# Patient Record
Sex: Female | Born: 1945
Health system: Southern US, Community
[De-identification: ages and names within clinical notes are randomized; demographics above are authoritative.]

## PROBLEM LIST (undated history)

## (undated) DIAGNOSIS — Z9889 Other specified postprocedural states: Secondary | ICD-10-CM

## (undated) DIAGNOSIS — T7840XA Allergy, unspecified, initial encounter: Secondary | ICD-10-CM

## (undated) DIAGNOSIS — K635 Polyp of colon: Secondary | ICD-10-CM

## (undated) DIAGNOSIS — Z85828 Personal history of other malignant neoplasm of skin: Secondary | ICD-10-CM

## (undated) DIAGNOSIS — C4491 Basal cell carcinoma of skin, unspecified: Secondary | ICD-10-CM

## (undated) DIAGNOSIS — K602 Anal fissure, unspecified: Secondary | ICD-10-CM

## (undated) DIAGNOSIS — M1990 Unspecified osteoarthritis, unspecified site: Secondary | ICD-10-CM

## (undated) DIAGNOSIS — K589 Irritable bowel syndrome without diarrhea: Secondary | ICD-10-CM

## (undated) DIAGNOSIS — I10 Essential (primary) hypertension: Secondary | ICD-10-CM

## (undated) DIAGNOSIS — D039 Melanoma in situ, unspecified: Secondary | ICD-10-CM

## (undated) DIAGNOSIS — E785 Hyperlipidemia, unspecified: Secondary | ICD-10-CM

## (undated) DIAGNOSIS — R112 Nausea with vomiting, unspecified: Secondary | ICD-10-CM

## (undated) DIAGNOSIS — C439 Malignant melanoma of skin, unspecified: Secondary | ICD-10-CM

## (undated) DIAGNOSIS — H8110 Benign paroxysmal vertigo, unspecified ear: Secondary | ICD-10-CM

## (undated) DIAGNOSIS — T8859XA Other complications of anesthesia, initial encounter: Secondary | ICD-10-CM

## (undated) DIAGNOSIS — Z8616 Personal history of COVID-19: Secondary | ICD-10-CM

## (undated) DIAGNOSIS — T4145XA Adverse effect of unspecified anesthetic, initial encounter: Secondary | ICD-10-CM

## (undated) HISTORY — DX: Benign paroxysmal vertigo, unspecified ear: H81.10

## (undated) HISTORY — PX: MOHS SURGERY: SUR867

## (undated) HISTORY — DX: Allergy, unspecified, initial encounter: T78.40XA

## (undated) HISTORY — DX: Malignant melanoma of skin, unspecified: C43.9

## (undated) HISTORY — PX: OTHER SURGICAL HISTORY: SHX169

## (undated) HISTORY — DX: Essential (primary) hypertension: I10

## (undated) HISTORY — DX: Irritable bowel syndrome, unspecified: K58.9

## (undated) HISTORY — DX: Hyperlipidemia, unspecified: E78.5

## (undated) HISTORY — DX: Personal history of other malignant neoplasm of skin: Z85.828

## (undated) HISTORY — DX: Polyp of colon: K63.5

## (undated) HISTORY — DX: Anal fissure, unspecified: K60.2

## (undated) HISTORY — DX: Personal history of COVID-19: Z86.16

## (undated) HISTORY — DX: Basal cell carcinoma of skin, unspecified: C44.91

## (undated) HISTORY — PX: ABDOMINAL HYSTERECTOMY: SHX81

## (undated) HISTORY — DX: Melanoma in situ, unspecified: D03.9

---

## 1998-02-05 ENCOUNTER — Other Ambulatory Visit: Admission: RE | Admit: 1998-02-05 | Discharge: 1998-02-05 | Payer: Self-pay | Admitting: *Deleted

## 1998-05-31 ENCOUNTER — Emergency Department (HOSPITAL_COMMUNITY): Admission: EM | Admit: 1998-05-31 | Discharge: 1998-05-31 | Payer: Self-pay | Admitting: Emergency Medicine

## 1998-09-20 ENCOUNTER — Encounter (INDEPENDENT_AMBULATORY_CARE_PROVIDER_SITE_OTHER): Payer: Self-pay | Admitting: Specialist

## 1998-09-20 ENCOUNTER — Ambulatory Visit (HOSPITAL_COMMUNITY): Admission: RE | Admit: 1998-09-20 | Discharge: 1998-09-20 | Payer: Self-pay | Admitting: *Deleted

## 1999-02-20 ENCOUNTER — Other Ambulatory Visit: Admission: RE | Admit: 1999-02-20 | Discharge: 1999-02-20 | Payer: Self-pay | Admitting: *Deleted

## 2000-02-24 ENCOUNTER — Other Ambulatory Visit: Admission: RE | Admit: 2000-02-24 | Discharge: 2000-02-24 | Payer: Self-pay | Admitting: *Deleted

## 2001-02-22 ENCOUNTER — Other Ambulatory Visit: Admission: RE | Admit: 2001-02-22 | Discharge: 2001-02-22 | Payer: Self-pay | Admitting: *Deleted

## 2002-03-16 ENCOUNTER — Other Ambulatory Visit: Admission: RE | Admit: 2002-03-16 | Discharge: 2002-03-16 | Payer: Self-pay | Admitting: *Deleted

## 2003-01-27 LAB — HM COLONOSCOPY: HM Colonoscopy: NORMAL

## 2003-03-21 ENCOUNTER — Other Ambulatory Visit: Admission: RE | Admit: 2003-03-21 | Discharge: 2003-03-21 | Payer: Self-pay | Admitting: *Deleted

## 2003-04-04 ENCOUNTER — Encounter: Payer: Self-pay | Admitting: Internal Medicine

## 2003-05-04 ENCOUNTER — Encounter: Payer: Self-pay | Admitting: Internal Medicine

## 2004-03-26 ENCOUNTER — Other Ambulatory Visit: Admission: RE | Admit: 2004-03-26 | Discharge: 2004-03-26 | Payer: Self-pay | Admitting: *Deleted

## 2005-03-23 ENCOUNTER — Encounter: Admission: RE | Admit: 2005-03-23 | Discharge: 2005-06-21 | Payer: Self-pay | Admitting: Endocrinology

## 2005-06-03 ENCOUNTER — Other Ambulatory Visit: Admission: RE | Admit: 2005-06-03 | Discharge: 2005-06-03 | Payer: Self-pay | Admitting: *Deleted

## 2005-06-06 ENCOUNTER — Emergency Department (HOSPITAL_COMMUNITY): Admission: EM | Admit: 2005-06-06 | Discharge: 2005-06-06 | Payer: Self-pay | Admitting: Family Medicine

## 2005-08-25 ENCOUNTER — Encounter: Payer: Self-pay | Admitting: Internal Medicine

## 2005-11-23 ENCOUNTER — Ambulatory Visit: Payer: Self-pay | Admitting: Internal Medicine

## 2005-12-15 ENCOUNTER — Ambulatory Visit: Payer: Self-pay | Admitting: Internal Medicine

## 2005-12-15 LAB — CONVERTED CEMR LAB
Basophils Relative: 0.3 % (ref 0.0–1.0)
Calcium: 9.4 mg/dL (ref 8.4–10.5)
Cholesterol: 192 mg/dL (ref 0–200)
Glomerular Filtration Rate, Af Am: 93 mL/min/{1.73_m2}
HDL: 71.2 mg/dL (ref >39.0)
Hemoglobin: 12.8 g/dL (ref 12.0–15.0)
LDL Cholesterol: 92 mg/dL (ref 0–99)
Lymphocytes Relative: 34.2 % (ref 12.0–46.0)
MCHC: 34 g/dL (ref 30.0–36.0)
Monocytes Relative: 7.9 % (ref 3.0–11.0)
Platelets: 258 10*3/uL (ref 150–400)
Potassium: 4.7 meq/L (ref 3.5–5.1)
RBC: 4.08 M/uL (ref 3.87–5.11)
RDW: 12.5 % (ref 11.5–14.6)
TSH: 0.85 microintl units/mL (ref 0.35–5.50)
Total Bilirubin: 0.6 mg/dL (ref 0.3–1.2)
VLDL: 29 mg/dL (ref 0–40)
WBC: 7 10*3/uL (ref 4.5–10.5)

## 2005-12-22 ENCOUNTER — Ambulatory Visit: Payer: Self-pay | Admitting: Internal Medicine

## 2006-03-10 ENCOUNTER — Ambulatory Visit: Payer: Self-pay | Admitting: Internal Medicine

## 2006-05-27 LAB — CONVERTED CEMR LAB: Pap Smear: NORMAL

## 2006-06-08 ENCOUNTER — Other Ambulatory Visit: Admission: RE | Admit: 2006-06-08 | Discharge: 2006-06-08 | Payer: Self-pay | Admitting: *Deleted

## 2006-07-05 ENCOUNTER — Encounter: Payer: Self-pay | Admitting: Internal Medicine

## 2006-10-29 ENCOUNTER — Ambulatory Visit: Payer: Self-pay | Admitting: Internal Medicine

## 2006-10-29 DIAGNOSIS — I10 Essential (primary) hypertension: Secondary | ICD-10-CM | POA: Insufficient documentation

## 2006-11-08 ENCOUNTER — Telehealth: Payer: Self-pay | Admitting: *Deleted

## 2006-12-22 ENCOUNTER — Ambulatory Visit: Payer: Self-pay | Admitting: Internal Medicine

## 2006-12-22 DIAGNOSIS — D649 Anemia, unspecified: Secondary | ICD-10-CM | POA: Insufficient documentation

## 2006-12-22 DIAGNOSIS — Z860100 Personal history of colon polyps, unspecified: Secondary | ICD-10-CM | POA: Insufficient documentation

## 2006-12-22 DIAGNOSIS — E781 Pure hyperglyceridemia: Secondary | ICD-10-CM | POA: Insufficient documentation

## 2006-12-27 ENCOUNTER — Encounter: Payer: Self-pay | Admitting: Internal Medicine

## 2006-12-28 ENCOUNTER — Telehealth: Payer: Self-pay | Admitting: Internal Medicine

## 2007-01-04 ENCOUNTER — Telehealth: Payer: Self-pay | Admitting: Internal Medicine

## 2007-01-05 ENCOUNTER — Ambulatory Visit: Payer: Self-pay | Admitting: Internal Medicine

## 2007-01-05 DIAGNOSIS — H8110 Benign paroxysmal vertigo, unspecified ear: Secondary | ICD-10-CM | POA: Insufficient documentation

## 2007-01-05 LAB — CONVERTED CEMR LAB
Basophils Relative: 0 % (ref 0–1)
Cholesterol, target level: 200 mg/dL
Eosinophils Absolute: 0 10*3/uL — ABNORMAL LOW (ref 0.2–0.7)
Eosinophils Relative: 1 % (ref 0–5)
Ferritin: 210 ng/mL (ref 10–291)
HCT: 40 % (ref 36.0–46.0)
Hemoglobin: 13.1 g/dL (ref 12.0–15.0)
LDL Goal: 130 mg/dL
Lymphocytes Relative: 46 % (ref 12–46)
MCHC: 32.8 g/dL (ref 30.0–36.0)
MCV: 93.2 fL (ref 78.0–100.0)
Neutrophils Relative %: 43 % (ref 43–77)
Platelets: 180 10*3/uL (ref 150–400)
RBC: 4.29 M/uL (ref 3.87–5.11)
Vitamin B-12: 418 pg/mL (ref 211–911)

## 2007-01-12 ENCOUNTER — Ambulatory Visit: Payer: Self-pay | Admitting: Internal Medicine

## 2007-01-21 ENCOUNTER — Encounter: Payer: Self-pay | Admitting: Internal Medicine

## 2007-05-04 ENCOUNTER — Ambulatory Visit: Payer: Self-pay | Admitting: Internal Medicine

## 2007-05-08 LAB — CONVERTED CEMR LAB
LDL Cholesterol: 87 mg/dL (ref 0–99)
Total CHOL/HDL Ratio: 2.7
Triglycerides: 163 mg/dL — ABNORMAL HIGH (ref 0–149)
VLDL: 33 mg/dL (ref 0–40)

## 2007-05-19 ENCOUNTER — Ambulatory Visit: Payer: Self-pay | Admitting: Internal Medicine

## 2007-05-19 DIAGNOSIS — J301 Allergic rhinitis due to pollen: Secondary | ICD-10-CM | POA: Insufficient documentation

## 2007-06-15 ENCOUNTER — Other Ambulatory Visit: Admission: RE | Admit: 2007-06-15 | Discharge: 2007-06-15 | Payer: Self-pay | Admitting: Gynecology

## 2007-06-15 ENCOUNTER — Encounter: Payer: Self-pay | Admitting: Internal Medicine

## 2007-07-06 ENCOUNTER — Encounter: Payer: Self-pay | Admitting: Internal Medicine

## 2007-09-19 ENCOUNTER — Ambulatory Visit: Payer: Self-pay | Admitting: Internal Medicine

## 2007-09-19 DIAGNOSIS — K625 Hemorrhage of anus and rectum: Secondary | ICD-10-CM | POA: Insufficient documentation

## 2007-09-21 ENCOUNTER — Encounter: Payer: Self-pay | Admitting: Internal Medicine

## 2007-09-26 ENCOUNTER — Telehealth: Payer: Self-pay | Admitting: *Deleted

## 2007-12-20 ENCOUNTER — Ambulatory Visit: Payer: Self-pay | Admitting: Internal Medicine

## 2007-12-20 LAB — CONVERTED CEMR LAB
ALT: 26 units/L (ref 0–35)
AST: 23 units/L (ref 0–37)
Albumin: 3.8 g/dL (ref 3.5–5.2)
Alkaline Phosphatase: 63 units/L (ref 39–117)
BUN: 13 mg/dL (ref 6–23)
CO2: 30 meq/L (ref 19–32)
Chloride: 106 meq/L (ref 96–112)
Eosinophils Relative: 2.7 % (ref 0.0–5.0)
GFR calc Af Amer: 93 mL/min
Glucose, Bld: 92 mg/dL (ref 70–99)
HCT: 37.5 % (ref 36.0–46.0)
Ketones, urine, test strip: NEGATIVE
Monocytes Absolute: 0.5 10*3/uL (ref 0.1–1.0)
Monocytes Relative: 7.3 % (ref 3.0–12.0)
Nitrite: NEGATIVE
Platelets: 217 10*3/uL (ref 150–400)
Potassium: 4.1 meq/L (ref 3.5–5.1)
Total CHOL/HDL Ratio: 3.1
Total Protein: 7.3 g/dL (ref 6.0–8.3)
Triglycerides: 136 mg/dL (ref 0–149)
Urobilinogen, UA: 0.2
WBC: 6.4 10*3/uL (ref 4.5–10.5)

## 2007-12-28 ENCOUNTER — Ambulatory Visit: Payer: Self-pay | Admitting: Internal Medicine

## 2007-12-28 DIAGNOSIS — N951 Menopausal and female climacteric states: Secondary | ICD-10-CM | POA: Insufficient documentation

## 2008-01-09 ENCOUNTER — Telehealth: Payer: Self-pay | Admitting: *Deleted

## 2008-01-13 ENCOUNTER — Telehealth: Payer: Self-pay | Admitting: Internal Medicine

## 2008-02-10 ENCOUNTER — Telehealth: Payer: Self-pay | Admitting: *Deleted

## 2008-02-24 ENCOUNTER — Telehealth: Payer: Self-pay | Admitting: Internal Medicine

## 2008-03-21 ENCOUNTER — Ambulatory Visit: Payer: Self-pay | Admitting: Internal Medicine

## 2008-03-21 DIAGNOSIS — M542 Cervicalgia: Secondary | ICD-10-CM | POA: Insufficient documentation

## 2008-03-26 ENCOUNTER — Telehealth: Payer: Self-pay | Admitting: *Deleted

## 2008-04-02 ENCOUNTER — Encounter: Payer: Self-pay | Admitting: Internal Medicine

## 2008-04-25 ENCOUNTER — Telehealth: Payer: Self-pay | Admitting: *Deleted

## 2008-07-09 ENCOUNTER — Encounter: Payer: Self-pay | Admitting: *Deleted

## 2008-07-09 ENCOUNTER — Encounter: Payer: Self-pay | Admitting: Internal Medicine

## 2008-09-19 ENCOUNTER — Ambulatory Visit: Payer: Self-pay | Admitting: Internal Medicine

## 2008-09-19 DIAGNOSIS — M21619 Bunion of unspecified foot: Secondary | ICD-10-CM | POA: Insufficient documentation

## 2008-09-25 ENCOUNTER — Encounter: Payer: Self-pay | Admitting: Internal Medicine

## 2008-10-22 ENCOUNTER — Ambulatory Visit: Payer: Self-pay | Admitting: Family Medicine

## 2008-10-22 DIAGNOSIS — R209 Unspecified disturbances of skin sensation: Secondary | ICD-10-CM | POA: Insufficient documentation

## 2008-11-15 ENCOUNTER — Telehealth: Payer: Self-pay | Admitting: *Deleted

## 2008-11-23 HISTORY — PX: BUNIONECTOMY: SHX129

## 2008-12-03 ENCOUNTER — Telehealth: Payer: Self-pay | Admitting: *Deleted

## 2008-12-07 ENCOUNTER — Telehealth: Payer: Self-pay | Admitting: *Deleted

## 2008-12-12 ENCOUNTER — Telehealth: Payer: Self-pay | Admitting: *Deleted

## 2009-01-01 ENCOUNTER — Ambulatory Visit: Payer: Self-pay | Admitting: Internal Medicine

## 2009-01-01 LAB — CONVERTED CEMR LAB
ALT: 19 units/L (ref 0–35)
AST: 16 units/L (ref 0–37)
Basophils Relative: 0.3 % (ref 0.0–3.0)
Bilirubin, Direct: 0 mg/dL (ref 0.0–0.3)
Chloride: 105 meq/L (ref 96–112)
Creatinine, Ser: 0.8 mg/dL (ref 0.4–1.2)
Direct LDL: 123.6 mg/dL
Eosinophils Relative: 2.1 % (ref 0.0–5.0)
GFR calc non Af Amer: 76.37 mL/min (ref >60)
HCT: 38 % (ref 36.0–46.0)
HDL: 60.5 mg/dL (ref >39.00)
MCV: 94.4 fL (ref 78.0–100.0)
Monocytes Absolute: 0.5 10*3/uL (ref 0.1–1.0)
Monocytes Relative: 8.3 % (ref 3.0–12.0)
Neutrophils Relative %: 47.4 % (ref 43.0–77.0)
Nitrite: NEGATIVE
Potassium: 4.6 meq/L (ref 3.5–5.1)
Protein, U semiquant: NEGATIVE
RBC: 4.03 M/uL (ref 3.87–5.11)
Total Bilirubin: 0.7 mg/dL (ref 0.3–1.2)
Total CHOL/HDL Ratio: 3
Total Protein: 7.3 g/dL (ref 6.0–8.3)
Urobilinogen, UA: 0.2
VLDL: 20.8 mg/dL (ref 0.0–40.0)
WBC Urine, dipstick: NEGATIVE
WBC: 6.2 10*3/uL (ref 4.5–10.5)

## 2009-01-09 ENCOUNTER — Ambulatory Visit: Payer: Self-pay | Admitting: Internal Medicine

## 2009-02-26 ENCOUNTER — Telehealth: Payer: Self-pay | Admitting: *Deleted

## 2009-04-24 ENCOUNTER — Telehealth: Payer: Self-pay | Admitting: *Deleted

## 2009-04-26 ENCOUNTER — Encounter: Payer: Self-pay | Admitting: Internal Medicine

## 2009-05-08 ENCOUNTER — Telehealth: Payer: Self-pay | Admitting: *Deleted

## 2009-07-11 ENCOUNTER — Encounter: Payer: Self-pay | Admitting: Internal Medicine

## 2009-07-24 ENCOUNTER — Telehealth: Payer: Self-pay | Admitting: *Deleted

## 2009-08-12 ENCOUNTER — Ambulatory Visit: Payer: Self-pay | Admitting: Internal Medicine

## 2009-10-26 HISTORY — PX: ROTATOR CUFF REPAIR: SHX139

## 2009-12-03 ENCOUNTER — Telehealth: Payer: Self-pay | Admitting: Internal Medicine

## 2010-01-03 ENCOUNTER — Ambulatory Visit: Payer: Self-pay | Admitting: Internal Medicine

## 2010-01-03 LAB — CONVERTED CEMR LAB
ALT: 23 units/L (ref 0–35)
BUN: 16 mg/dL (ref 6–23)
Bilirubin, Direct: 0.1 mg/dL (ref 0.0–0.3)
Blood in Urine, dipstick: NEGATIVE
Calcium: 9.7 mg/dL (ref 8.4–10.5)
Chloride: 105 meq/L (ref 96–112)
Cholesterol: 236 mg/dL — ABNORMAL HIGH (ref 0–200)
Creatinine, Ser: 0.8 mg/dL (ref 0.4–1.2)
Direct LDL: 140.6 mg/dL
Eosinophils Relative: 2.6 % (ref 0.0–5.0)
GFR calc non Af Amer: 77.25 mL/min (ref >60.00)
HDL: 63 mg/dL (ref >39.00)
Ketones, urine, test strip: NEGATIVE
MCV: 92.9 fL (ref 78.0–100.0)
Monocytes Absolute: 0.5 10*3/uL (ref 0.1–1.0)
Neutrophils Relative %: 49.2 % (ref 43.0–77.0)
Nitrite: NEGATIVE
Platelets: 213 10*3/uL (ref 150.0–400.0)
Protein, U semiquant: NEGATIVE
Specific Gravity, Urine: 1.02
Total Bilirubin: 0.6 mg/dL (ref 0.3–1.2)
Triglycerides: 114 mg/dL (ref 0.0–149.0)
Urobilinogen, UA: 0.2
VLDL: 22.8 mg/dL (ref 0.0–40.0)
WBC Urine, dipstick: NEGATIVE
WBC: 6 10*3/uL (ref 4.5–10.5)

## 2010-01-13 ENCOUNTER — Ambulatory Visit: Payer: Self-pay | Admitting: Internal Medicine

## 2010-01-13 DIAGNOSIS — M79609 Pain in unspecified limb: Secondary | ICD-10-CM | POA: Insufficient documentation

## 2010-01-13 DIAGNOSIS — E785 Hyperlipidemia, unspecified: Secondary | ICD-10-CM | POA: Insufficient documentation

## 2010-02-23 LAB — CONVERTED CEMR LAB
BUN: 6 mg/dL (ref 6–23)
Basophils Absolute: 0 10*3/uL (ref 0.0–0.1)
Basophils Relative: 0.3 % (ref 0.0–1.0)
CO2: 29 meq/L (ref 19–32)
Cholesterol: 189 mg/dL (ref 0–200)
Creatinine, Ser: 1 mg/dL (ref 0.4–1.2)
Eosinophils Absolute: 0.1 10*3/uL (ref 0.0–0.6)
GFR calc Af Amer: 72 mL/min
Glucose, Bld: 84 mg/dL (ref 70–99)
HDL: 57.1 mg/dL (ref >39.0)
Lymphocytes Relative: 32.3 % (ref 12.0–46.0)
Neutro Abs: 3.5 10*3/uL (ref 1.4–7.7)
Nitrite: NEGATIVE
Potassium: 4.1 meq/L (ref 3.5–5.1)
Protein, U semiquant: NEGATIVE
RBC: 3.58 M/uL — ABNORMAL LOW (ref 3.87–5.11)
Specific Gravity, Urine: 1.01
TSH: 1.07 microintl units/mL (ref 0.35–5.50)
Triglycerides: 286 mg/dL (ref 0–149)
Urobilinogen, UA: 0.2
WBC: 6.4 10*3/uL (ref 4.5–10.5)
pH: 6.5

## 2010-02-25 NOTE — Progress Notes (Signed)
Summary: need rx to exercise  Phone Note Call from Patient Call back at Home Phone [phone]   Caller: Patient----live call Reason for Call: Referral Summary of Call: Wants to join a fitness center. They will need on a rx pad that it is ok for her to exercise, because she is on bp meds. Fax to her at [phone]. Initial call taken by: Warnell Forester,  May 08, 2009 8:30 AM  Follow-up for Phone Call        ok to do this  no restrictions Follow-up by: [name] MD,  May 08, 2009 2:17 PM  Additional Follow-up for Phone Call Additional follow up Details #1::        Note faxed to pt. Additional Follow-up by: Romualdo Bolk, CMA Duncan Dull),  May 08, 2009 2:38 PM

## 2010-02-25 NOTE — Progress Notes (Signed)
Summary: refils  Phone Note Refill Request Message from:  Fax from Pharmacy  Refills Requested: Medication #1:  VIVELLE-DOT 0.0375 MG/24HR PTTW apply 2 x per week  Medication #2:  LOSARTAN POTASSIUM 50 MG TABS 1 by mouth once daily [facility]  fax---[phone]    Initial call taken by: Warnell Forester,  February 26, 2009 9:24 AM  Follow-up for Phone Call        Rx sent to pharmacy. Follow-up by: Romualdo Bolk, CMA (AAMA),  February 26, 2009 9:36 AM    Prescriptions: LOSARTAN POTASSIUM 50 MG TABS (LOSARTAN POTASSIUM) 1 by mouth once daily  #90 x 3   Entered by:   Romualdo Bolk, CMA (AAMA)   Authorized by:   [name] MD   Signed by:   Romualdo Bolk, CMA (AAMA) on 02/26/2009   Method used:   Electronically to        [facility]* (mail-order)             ,          Ph: [phone]       Fax: [phone]   RxID:   [id number] VIVELLE-DOT 0.0375 MG/24HR PTTW (ESTRADIOL) apply 2 x per week  #24 x 3   Entered by:   Romualdo Bolk, CMA (AAMA)   Authorized by:   [name] MD   Signed by:   Romualdo Bolk, CMA (AAMA) on 02/26/2009   Method used:   Electronically to        [facility]* (mail-order)             ,          Ph: [phone]       Fax: [phone]   RxID:   [id number]

## 2010-02-25 NOTE — Assessment & Plan Note (Signed)
Summary: rash.//ccm   Vital Signs:  Patient profile:   65 year old female Menstrual status:  hysterectomy Height:      64.75 inches Weight:      143 pounds BMI:     24.07 Temp:     98.2 degrees F oral Pulse rate:   66 / minute BP sitting:   120 / 70  (left arm) Cuff size:   regular  Vitals Entered By: Romualdo Bolk, CMA (AAMA) (August 12, 2009 10:32 AM) CC: Rash on bottom- that has been there for about 1 week inside her buttocks. It is like blisters that are itchy, red and sore. Pt is also anal leakage that has been worse this pt year.    History of Present Illness: Sheila Livingston comes in today  for sda  for above . Ongoing problem off and on for a while   Got worse this weekend .      using   nystatin   tmc   . ? help but sting stoped and dove soap. Some bowel leakage when wipes  no chemical     Preventive Screening-Counseling & Management  Alcohol-Tobacco     Alcohol drinks/day: <1     Alcohol type: wine     Smoking Status: quit     Year Quit: 1978  Caffeine-Diet-Exercise     Caffeine use/day: 2-3     Does Patient Exercise: yes     Type of exercise: walk     Times/week: 7  Current Medications (verified): 1)  Centrum Silver   Tabs (Multiple Vitamins-Minerals) 2)  Fish Oil   Oil (Fish Oil) 3)  Sudafed 30 Mg  Tabs (Pseudoephedrine Hcl) .... As Needed 4)  Baby Aspirin 81 Mg  Chew (Aspirin) 5)  Flax Seed Oil 1000 Mg  Caps (Flaxseed (Linseed)) 6)  Nystatin-Triamcinolone 100000-0.1 Unit/gm-%  Crea (Nystatin-Triamcinolone) 7)  Vitamin D 8)  Vivelle-Dot 0.0375 Mg/24hr Pttw (Estradiol) .... Apply 2 X Per Week 9)  Losartan Potassium 50 Mg Tabs (Losartan Potassium) .Marland Kitchen.. 1 By Mouth Once Daily 10)  Advil 200 Mg Tabs (Ibuprofen) .... As Needed  Allergies (verified): 1)  ! Imodium A-D (Loperamide Hcl) 2)  ! Flonase (Fluticasone Propionate) 3)  ! * Lisinopril  Past History:  Past medical, surgical, family and social histories (including risk factors) reviewed for relevance  to current acute and chronic problems.  Past Medical History: Reviewed history from 03/21/2008 and no changes required. Hypertension Colonic polyps, hx of  BPV Allergic rhinitis HRT PAP always normal Endometriosis  hysterectomy CONSULTANTS  Fore---Mezer  Gammin   Colliins GSo  Past Surgical History: Reviewed history from 01/09/2009 and no changes required. Hysterectomy bso .. endometriosis no cancer  Bunionectomy 11/23/2008  Past History:  Care Management:  Marylene Buerger  GYNE  Dr Randell Patient previously Podiatry: [facility]  Family History: Reviewed history from 01/09/2009 and no changes required. Family History High cholesterol Family History of Stroke M 1st degree relative   MOM  DM Breast ca sis Family History Breast cancer 1st degree relative sis Family History Diabetes 1st degree relative sis Family History Hypertension Father Suicide 10         Social History: Reviewed history from 01/09/2009 and no changes required. Former Smoker   Alcohol use-no Drug use-no Regular exercise-yes widowed      dating   hhof 1  dog and cat.  considering retirement    Review of Systems  The patient denies anorexia, fever, abdominal pain, melena, hematochezia, severe indigestion/heartburn, hematuria, difficulty walking,  and enlarged lymph nodes.    Physical Exam  General:  alert, well-developed, and well-nourished.   Skin:  intertrigo   in buttocks crease and diffuse  area of redness and  ? discrete edge.    no vesicles or prutulses  and no weeping.    no blisters  some scaling? at edge.  Inguinal Nodes:  No significant adenopathy   Impression & Recommendations:  Problem # 1:  INTERTRIGO (JYN-829.56) ? yeast and  focal phenom    disc and rx     Problem # 2:  ? of DERMATOPHYTOSIS OF GROIN AND PERIANAL AREA (ICD-110.3) see her Gi  due fo rcolon anyway   Problem # 3:  fecal leakage   see her Gi ( due for  colon anyway )  Complete Medication List: 1)  Centrum Silver Tabs  (Multiple vitamins-minerals) 2)  Fish Oil Oil (Fish oil) 3)  Sudafed 30 Mg Tabs (Pseudoephedrine hcl) .... As needed 4)  Baby Aspirin 81 Mg Chew (Aspirin) 5)  Flax Seed Oil 1000 Mg Caps (Flaxseed (linseed)) 6)  Nystatin-triamcinolone 100000-0.1 Unit/gm-% Crea (Nystatin-triamcinolone) 7)  Vitamin D  8)  Vivelle-dot 0.0375 Mg/24hr Pttw (Estradiol) .... Apply 2 x per week 9)  Losartan Potassium 50 Mg Tabs (Losartan potassium) .Marland Kitchen.. 1 by mouth once daily 10)  Advil 200 Mg Tabs (Ibuprofen) .... As needed 11)  Ketoconazole 2 % Crea (Ketoconazole) .... Apply to affected area two times a day  or as directed 12)  Hydrocortisone 2.5 % Oint (Hydrocortisone) .... Apply to  affected area two times a day as needed  Patient Instructions: 1)  use antifungal cream  and then cortisone ointment  as needed for itching.  2)  Keep as dry as possible    3)  dove soap is ok.  4)  expect improvement in the next week or s 2  if not improving calll   5)  Need to be UTD on your colonscopy Prescriptions: HYDROCORTISONE 2.5 % OINT (HYDROCORTISONE) apply to  affected area two times a day as needed  #30 x 1   Entered and Authorized by:   [name] MD   Signed by:   [name] MD on 08/12/2009   Method used:   Electronically to        [facility]* (retail)       [address]       [place], [place]  [address]       Ph: [phone] or [phone]       Fax: [phone]   RxID:   [id number] KETOCONAZOLE 2 % CREA (KETOCONAZOLE) apply to affected area two times a day  or as directed  #30 gram x 1   Entered and Authorized by:   [name] MD   Signed by:   [name] MD on 08/12/2009   Method used:   Electronically to        [facility]* (retail)       [address]       [place], [place]  [address]       Ph: [phone] or [phone]       Fax: [phone]   RxID:   [id number]

## 2010-02-25 NOTE — Progress Notes (Signed)
Summary: CPX APPT NEEDED - [facility] FROM BMP LIST  Phone Note Call from Patient   Caller: Patient Summary of Call: Tuesday, December 03, 2009  at  10:59am  Sheila Livingston, DOB: 03-27-1945,  MRN - [id number]  Pt called in to reschedule appt from bump list.  Pt has appt on 12/16  at  8:45am and needs to be rescheduled on or after this date due to insurance. No appts are available within "schedule guidelines" and pt states she has to get in for CPX before the end of the year.   Can you advise a date/time for pts cpx?  Thanks,  Bjorn Loser / Scheduling  Initial call taken by: Debbra Riding,  December 03, 2009 11:08 AM  Follow-up for Phone Call        monday  dec 19th at  215  30 minutes  is ok  Follow-up by: [name] MD,  December 03, 2009 6:11 PM  Additional Follow-up for Phone Call Additional follow up Details #1::        Left msg for pt to call back so she can be advised that she has been scheduled for cpx on  01/13/2010  at  2:15pm.  Additional Follow-up by: Debbra Riding,  December 04, 2009 8:49 AM

## 2010-02-25 NOTE — Progress Notes (Signed)
Summary: Pt wants to know if she needs to have bone density test?  Phone Note Call from Patient Call back at Work Phone [phone]   Caller: Patient Summary of Call: Pt called and wants to know if she is suppose to get a bone density test done, when she has her mammogram on 07/10/09? Initial call taken by: Lucy Antigua,  April 24, 2009 11:20 AM  Follow-up for Phone Call        please send order for DEX bone density so she can get it done with her mammo gram. Dx menopausal  Follow-up by: [name] MD,  April 25, 2009 11:34 AM  Additional Follow-up for Phone Call Additional follow up Details #1::        Left message for pt to call back for pt to call back to let us know where to fax the order for the mmg. Additional Follow-up by: Romualdo Bolk, CMA (AAMA),  April 25, 2009 12:00 PM    Additional Follow-up for Phone Call Additional follow up Details #2::    Pt called back and uses [facility]. Faxed order to [facility]. Follow-up by: Romualdo Bolk, CMA (AAMA),  April 29, 2009 11:43 AM

## 2010-02-25 NOTE — Progress Notes (Signed)
Summary: dexa results  Phone Note Outgoing Call   Call placed by: Romualdo Bolk, CMA Duncan Dull),  July 24, 2009 5:07 PM Call placed to: Patient Summary of Call: Dexa showed osteopenia. Take a MVI with Vit D supplement up to 2000 international units and calcium as recommened. Plus weight bearing exercise. Left message to call back. Initial call taken by: Romualdo Bolk, CMA Duncan Dull),  July 24, 2009 5:08 PM  Follow-up for Phone Call        Pt aware of results. Follow-up by: Romualdo Bolk, CMA (AAMA),  July 25, 2009 11:08 AM

## 2010-02-27 NOTE — Assessment & Plan Note (Signed)
Summary: CPX  // RS   Vital Signs:  Patient profile:   65 year old female Menstrual status:  hysterectomy Height:      64.5 inches Weight:      149 pounds Pulse rate:   78 / minute BP sitting:   110 / 70  (right arm) Cuff size:   regular  Vitals Entered By: Romualdo Bolk, CMA Duncan Dull) (January 13, 2010 2:27 PM) CC: Annual Visit for disease management   History of Present Illness: Sheila Livingston.   comes in today  for preventive visit  .  . since her last visit she has no major cahnge is health but did have ortho surgeries. after a shoulder injury .  She is still working and no sig limitations.  check left ear  crusty  No hearing loss  Here for Medicare AWV:  1.   Risk factors based on Past M, S, F history: 2.   Physical Activities: line dancing  very active  3.   Depression/mood:  stable  4.   Hearing:    normal  5.   ADL's:   self independent     working  6.   Fall Risk: reviewed  7.   Home Safety: reviewed  8.   Height, weight, &visual acuity: checked this  year and ok  9.   Counseling:  10.   Labs ordered based on risk factors:  11.           Referral Coordination 12.           Care Plan 13.            Cognitive Assessment    Pt is A&Ox3,affect,speech,memory,attention,&motor skills appear intact.  HT: OCntrolled  HRTXContinuing nose  Ortho  had left shoulder surgery and   left bunion in the past.    Preventive Care Screening  Mammogram:    Date:  06/26/2009    Results:  normal   Prior Values:    Pap Smear:  Normal (05/27/2006)    Mammogram:  normal (07/09/2008)    Colonoscopy:  Normal (01/27/2003)    Bone Density:  Normal (06/26/2004)    Last Tetanus Booster:  Td (01/26/2005)    Last Flu Shot:  Historical (10/26/2008)    Last Pneumovax:  Pneumovax (01/09/2009)    Dexa Interp:  Normal (06/26/2004)   Preventive Screening-Counseling & Management  Alcohol-Tobacco     Alcohol drinks/day: <1     Alcohol type: wine     Smoking Status: quit     Year Quit:  1978  Caffeine-Diet-Exercise     Caffeine use/day: 2-3     Does Patient Exercise: yes     Type of exercise: walk     Times/week: 7  Hep-HIV-STD-Contraception     Dental Visit-last 6 months yes     Sun Exposure-Excessive: no  Safety-Violence-Falls     Seat Belt Use: yes     Firearms in the Home: firearms in the home     Firearm Counseling: not indicated; uses recommended firearm safety measures     Smoke Detectors: yes     Fall Risk: fall at work   Comments: tripped over dog and shoulder  tear and then at wrk   tripped   Current Medications (verified): 1)  Centrum Silver   Tabs (Multiple Vitamins-Minerals) 2)  Fish Oil   Oil (Fish Oil) 3)  Sudafed 30 Mg  Tabs (Pseudoephedrine Hcl) .... As Needed 4)  Baby Aspirin 81 Mg  Chew (Aspirin) 5)  Flax Seed Oil 1000 Mg  Caps (Flaxseed (Linseed)) 6)  Vitamin D 7)  Vivelle-Dot 0.0375 Mg/24hr Pttw (Estradiol) .... Apply 2 X Per Week 8)  Losartan Potassium 50 Mg Tabs (Losartan Potassium) .Marland Kitchen.. 1 By Mouth Once Daily 9)  Advil 200 Mg Tabs (Ibuprofen) .... As Needed 10)  Ketoconazole 2 % Crea (Ketoconazole) .... Apply To Affected Area Two Times A Day  or As Directed 11)  Hydrocortisone 2.5 % Oint (Hydrocortisone) .... Apply To  Affected Area Two Times A Day As Needed  Allergies (verified): 1)  ! Imodium A-D (Loperamide Hcl) 2)  ! Flonase (Fluticasone Propionate) 3)  ! * Lisinopril  Past History:  Past medical, surgical, family and social histories (including risk factors) reviewed, and no changes noted (except as noted below).  Past Medical History: Reviewed history from 03/21/2008 and no changes required. Hypertension Colonic polyps, hx of  BPV Allergic rhinitis HRT PAP always normal Endometriosis  hysterectomy CONSULTANTS  Fore---Mezer  Gammin   Colliins GSo  Past Surgical History: Hysterectomy bso .. endometriosis no cancer  Bunionectomy 11/23/2008 Rt shoulder surgery on 10/2009  Past History:  Care Management:  Marylene Buerger  GYNE  Dr Randell Patient previously Podiatry: [facility] Orthopedics: [facility]    Family History: Reviewed history from 01/09/2009 and no changes required. Family History High cholesterol Family History of Stroke M 1st degree relative   MOM    died age 81  12/04/11DM Breast ca sis Family History Breast cancer 1st degree relative sis Family History Diabetes 1st degree relative sis Family History Hypertension Father Suicide 66         Social History: Reviewed history from 01/09/2009 and no changes required. Former Smoker   Alcohol use-no Drug use-no Regular exercise-yes widowed      dating   hhof 1  dog and cat.  considering retirement  not reitring   Mom passed a sway 2011/12/046-7 hours orsleepFall Risk:  fall at work   Review of Systems  The patient denies anorexia, fever, weight loss, weight gain, vision loss, decreased hearing, hoarseness, chest pain, syncope, melena, hematochezia, severe indigestion/heartburn, hematuria, transient blindness, depression, enlarged lymph nodes, angioedema, and breast masses.         left foot  hurts ball of foot.   some back pain   ocass left medial inferior leg pain  off and on without assoicated signs .  although does get low  back pain at times.   Physical Exam  General:  Well-developed,well-nourished,in no acute distress; alert,appropriate and cooperative throughout examination Head:  normocephalic and atraumatic.   Eyes:  PERRL, EOMs full, conjunctiva clear  Ears:  L ear normal and no external deformities.   right  no discharge some red flakiness  tm nl  Nose:  no external deformity and no nasal discharge.   Mouth:  good dentition and pharynx pink and moist.   Neck:  No deformities, masses, or tenderness noted.no carotid bruits Breasts:  No mass, nodules, thickening, tenderness, bulging, retraction, inflamation, nipple discharge or skin changes noted.   Lungs:  Normal respiratory effort, chest expands symmetrically. Lungs are clear to  auscultation, no crackles or wheezes.no dullness.   Heart:  Normal rate and regular rhythm. S1 and S2 normal without gallop, murmur, click, rub or other extra sounds.no lifts.   Abdomen:  Bowel sounds positive,abdomen soft and non-tender without masses, organomegaly or   noted. Genitalia:  hysterectomy Msk:  no joint swelling, no joint warmth, and no redness over joints.  scar  left foot   scant fat pad  no lesions  Pulses:  pulses intact without delay   Extremities:  no clubbing cyanosis or edema  Neurologic:  alert & oriented X3, strength normal in all extremities, and gait normal.   Pt is A&Ox3,affect,speech,memory,attention,&motor skills appear intact.  Skin:  turgor normal, color normal, no ecchymoses, and no petechiae.   Cervical Nodes:  No lymphadenopathy noted Axillary Nodes:  No palpable lymphadenopathy Inguinal Nodes:  No significant adenopathy Psych:  Normal eye contact, appropriate affect. Cognition appears normal.  labs reviewed   and ok  lipids could be slightly better  Impression & Recommendations:  Problem # 1:  PREVENTIVE HEALTH CARE (ICD-V70.0) Discussed nutrition,exercise,diet,healthy weight, vitamin D and calcium.   can use topical outside ear .    Problem # 2:  HYPERTENSION (ICD-401.9) Assessment: Unchanged  Her updated medication list for this problem includes:    Losartan Potassium 50 Mg Tabs (Losartan potassium) .Marland Kitchen... 1 by mouth once daily  BP today: 110/70 Prior BP: 120/70 (08/12/2009)  Prior 10 Yr Risk Heart Disease: Not enough information (09/19/2008)  Labs Reviewed: K+: 4.8 (01/03/2010) Creat: : 0.8 (01/03/2010)   Chol: 236 (01/03/2010)   HDL: 63.00 (01/03/2010)   LDL: DEL (12/20/2007)   TG: 114.0 (01/03/2010)  Problem # 3:  HYPERLIPIDEMIA (ICD-272.4)  Labs Reviewed: SGOT: 22 (01/03/2010)   SGPT: 23 (01/03/2010)  Lipid Goals: Chol Goal: 200 (01/05/2007)   HDL Goal: 40 (01/05/2007)   LDL Goal: 130 (01/05/2007)   TG Goal: 150 (01/05/2007)  Prior  10 Yr Risk Heart Disease: Not enough information (09/19/2008)   HDL:63.00 (01/03/2010), 60.50 (01/01/2009)  LDL:DEL (12/20/2007), 87 (16/10/9602)  Chol:236 (01/03/2010), 211 (01/01/2009)  Trig:114.0 (01/03/2010), 104.0 (01/01/2009)  Problem # 4:  SYMPTOMATIC MENOPAUSAL/FEMALE CLIMACTERIC STATES (ICD-627.2) can try wean Her updated medication list for this problem includes:    Vivelle-dot 0.0375 Mg/24hr Pttw (Estradiol) .Marland Kitchen... Apply 2 x per week  Problem # 5:  LEG PAIN, RIGHT (ICD-729.5) seem s  mechanical and to ,fu if persistent or  progressive  .  Complete Medication List: 1)  Centrum Silver Tabs (Multiple vitamins-minerals) 2)  Fish Oil Oil (Fish oil) 3)  Sudafed 30 Mg Tabs (Pseudoephedrine hcl) .... As needed 4)  Baby Aspirin 81 Mg Chew (Aspirin) 5)  Flax Seed Oil 1000 Mg Caps (Flaxseed (linseed)) 6)  Vitamin D  7)  Vivelle-dot 0.0375 Mg/24hr Pttw (Estradiol) .... Apply 2 x per week 8)  Losartan Potassium 50 Mg Tabs (Losartan potassium) .Marland Kitchen.. 1 by mouth once daily 9)  Advil 200 Mg Tabs (Ibuprofen) .... As needed 10)  Ketoconazole 2 % Crea (Ketoconazole) .... Apply to affected area two times a day  or as directed 11)  Hydrocortisone 2.5 % Oint (Hydrocortisone) .... Apply to  affected area two times a day as needed  Contraindications/Deferment of Procedures/Staging:    Test/Procedure: PAP Smear    Reason for deferment: not indicated   Patient Instructions: 1)  can try  weaning to one patch per week fo HRT .   (and then off if you wish.    ) 2)  Pharmacy can call for refills . 3)  get regular mammograms  4)  yearly check in a year or as needed.    Orders Added: 1)  Est. Patient 65& > [99397] 2)  Est. Patient Level II [54098]

## 2010-03-20 ENCOUNTER — Telehealth: Payer: Self-pay | Admitting: Internal Medicine

## 2010-03-20 MED ORDER — LOSARTAN POTASSIUM 50 MG PO TABS: 50.0000 mg | ORAL_TABLET | Freq: Every day | ORAL | Status: DC

## 2010-03-20 MED ORDER — ESTRADIOL 0.0375 MG/24HR TD PTTW: 1.0000 | MEDICATED_PATCH | TRANSDERMAL | Status: DC

## 2010-03-20 NOTE — Telephone Encounter (Signed)
Pt needs new rx 90 supply with 3 refills fax to [facility] [phone] vivelle-dot .0375mg  and losartan potassim 50 mg.

## 2010-03-20 NOTE — Telephone Encounter (Signed)
Ok to do these refills   For a year   Due for check up in December

## 2010-03-24 ENCOUNTER — Ambulatory Visit (INDEPENDENT_AMBULATORY_CARE_PROVIDER_SITE_OTHER): Payer: 59 | Admitting: Internal Medicine

## 2010-03-24 ENCOUNTER — Encounter: Payer: Self-pay | Admitting: Internal Medicine

## 2010-03-24 DIAGNOSIS — N809 Endometriosis, unspecified: Secondary | ICD-10-CM | POA: Insufficient documentation

## 2010-03-24 DIAGNOSIS — K602 Anal fissure, unspecified: Secondary | ICD-10-CM

## 2010-04-03 NOTE — Assessment & Plan Note (Signed)
Summary: BRB, RECTAL BLEEDING, PAIN IN LOWER BACK SCH W PT UHC-INS COP...   History of Present Illness Visit Type: new patient  Primary GI MD: [name] MD Primary Provider: [name], MD  Requesting Provider: na Chief Complaint: Pt c/o rectal itching, BRB when she wipes after BMs, constipation, rectal pain, bloating, and fecal incontinence  History of Present Illness:   This is a 65 year old, white female with intermittent rectal bleeding associated with itching, burning and rectal pain. She has difficulty in evacuation of her stool. She has a history of severe endometriosis causing infertility. She underwent 2 open pelvic surgeries and one laparoscopic surgery for endometriosis ,finally resulting in a total abdominal hysterectomy and salpingo-oophorectomy. She is on estrogen replacement. She had a colonoscopy in January 2005 with findings of a polyp by DR ganem.  She was told to come back in 10 years. She has to use manual pressure over perineal  area to evacuate the stool. She has mild urinary stress incontinence.   GI Review of Systems    Reports bloating.      Denies abdominal pain, acid reflux, belching, chest pain, dysphagia with liquids, dysphagia with solids, heartburn, loss of appetite, nausea, vomiting, vomiting blood, weight loss, and  weight gain.      Reports constipation, fecal incontinence, irritable bowel syndrome, rectal bleeding, and  rectal pain.     Denies anal fissure, black tarry stools, change in bowel habit, diarrhea, diverticulosis, heme positive stool, hemorrhoids, jaundice, light color stool, and  liver problems.    Current Medications (verified): 1)  Multivitamins   Tabs (Multiple Vitamin) .... One Tablet By Mouth Once Daily 2)  Fish Oil   Oil (Fish Oil) .... One Tablet By Mouth Once Daily 3)  Sudafed 30 Mg  Tabs (Pseudoephedrine Hcl) .... As Needed 4)  Baby Aspirin 81 Mg  Chew (Aspirin) .... Once Daily 5)  Flax Seed Oil 1000 Mg  Caps (Flaxseed (Linseed))  .... One Capsule By Mouth Once Daily 6)  Vitamin D 1000 Unit Tabs (Cholecalciferol) .... One Tablet By Mouth Once Daily 7)  Vivelle-Dot 0.0375 Mg/24hr Pttw (Estradiol) .... Apply 2 X Per Week 8)  Losartan Potassium 50 Mg Tabs (Losartan Potassium) .Marland Kitchen.. 1 By Mouth Once Daily 9)  Advil 200 Mg Tabs (Ibuprofen) .... As Needed 10)  Ketoconazole 2 % Crea (Ketoconazole) .... Apply To Affected Area Two Times A Day  or As Directed 11)  Hydrocortisone 2.5 % Oint (Hydrocortisone) .... Apply To  Affected Area Two Times A Day As Needed 12)  Stool Softener 250 Mg Caps (Docusate Sodium) .... As Needed  Allergies: 1)  ! Imodium A-D (Loperamide Hcl) 2)  ! Flonase (Fluticasone Propionate) 3)  ! * Lisinopril 4)  ! * Muscle Relaxers( Pt Unsure of Which One)  Past History:  Past Medical History: Hypertension Colonic polyps, hx of  BPV Allergic rhinitis HRT PAP always normal Endometriosis  hysterectomy IBS Hx of skin cancer Anal Fissure Hemorrhoids   CONSULTANTS  Fore---Mezer  Gammin   Colliins GSo  Past Surgical History: Reviewed history from 01/13/2010 and no changes required. Hysterectomy bso .. endometriosis no cancer  Bunionectomy 11/23/2008 Rt shoulder surgery on 10/2009  Family History: Family History High cholesterol Family History of Stroke M 1st degree relative   MOM    died age 2  2011/11/21DM Breast ca sis Family History Breast cancer 1st degree relative sis Family History Diabetes 1st degree relative sis Family History Hypertension Father Suicide 21  No FH of Colon Cancer: Family History of Colon Polyps:Sister   Social History: Former Smoker   Alcohol use-no Drug use-no Regular exercise-yes widowed      dating   hhof 1  dog and cat.  considering retirement  not retired    Mom passed away 12/10/116-7 hours orsleep Daily Caffeine Use: 3 daily   Review of Systems       The patient complains of allergy/sinus, back pain, and itching.  The patient denies  anemia, anxiety-new, arthritis/joint pain, blood in urine, breast changes/lumps, change in vision, confusion, cough, coughing up blood, depression-new, fainting, fatigue, fever, headaches-new, hearing problems, heart murmur, heart rhythm changes, menstrual pain, muscle pains/cramps, night sweats, nosebleeds, pregnancy symptoms, shortness of breath, skin rash, sleeping problems, sore throat, swelling of feet/legs, swollen lymph glands, thirst - excessive , urination - excessive , urination changes/pain, urine leakage, vision changes, and voice change.         Pertinent positive and negative review of systems were noted in the above HPI. All other ROS was otherwise negative.   Vital Signs:  Patient profile:   65 year old female Menstrual status:  hysterectomy Height:      64.5 inches Weight:      145 pounds BMI:     24.59 BSA:     1.72 Pulse rate:   80 / minute Pulse rhythm:   regular BP sitting:   120 / 64  (left arm) Cuff size:   regular  Vitals Entered By: Ok Anis CMA (March 24, 2010 10:56 AM)  Physical Exam  General:  Well developed, well nourished, no acute distress. Eyes:  PERRLA, no icterus. Mouth:  No deformity or lesions, dentition normal. Neck:  Supple; no masses or thyromegaly. Lungs:  Clear throughout to auscultation. Heart:  Regular rate and rhythm; no murmurs, rubs,  or bruits. Abdomen:  soft abdomen and healed surgical scars about the symphysis pubis. No distention. No palpable mass. Rectal:  rectal and anoscopic exam reveals normal perianal area. Normal rectal sphincter tone. A 1 cm superficial fissure at 9:00 appears to be bleeding after insertion of the anoscope. Rectal mucosa otherwise is normal. There are small first grade hemorrhoids internally. Extremities:  No clubbing, cyanosis, edema or deformities noted.   Impression & Recommendations:  Problem # 1:  RECTAL BLEEDING (ICD-569.3) Patient has an anal fissure at 9:00 which appears to be acute. There is no  fibrosis or fistulization. We will start her on topical steroids and analpram cream as well as Anusol-HC suppositories for internal hemorrhoids. She will start Benefiber one heaping teaspoon daily. She may need a pelvic ultrasound or gynecological evaluation to assess her pelvic relaxation.  Problem # 2:  COLONIC POLYPS, HX OF (ICD-V12.72) Patient is status post colonoscopy in 2005 with findings of a colon polyp. We will obtain records of the colonoscopy to determine if she is due for a repeat colonoscopy.  Problem # 3:  ENDOMETRIOSIS (ICD-617.9) history of extensive endometriosis status post laparoscopic and open pelvic surgeries resulting in TAH/BSO. Suspect pelvic adhesions  Patient Instructions: 1)  Please pick up your prescriptions at the pharmacy. Electronic prescription(s) has already been sent for Analpram Cream. You should apply this to your rectum twice daily as needed. We have also sent you a prescription for Anusol suppositories to use per rectum at bedtime 2)  patient information booklet on anal fissure. 3)  Consider pelvic ultrasound. 4)  Benefiber one teaspoon daily. 5)  The medication list was reviewed and reconciled.  All changed / newly prescribed medications were explained.  A complete medication list was provided to the patient / caregiver. Prescriptions: ANUSOL-HC 25 MG SUPP (HYDROCORTISONE ACETATE) Insert 1 suppository into rectum at bedtime  #12 x 0   Entered by:   Lamona Curl CMA (AAMA)   Authorized by:   Hart Carwin MD   Signed by:   Lamona Curl CMA (AAMA) on 03/24/2010   Method used:   Electronically to        [id number] (retail)       [address]       [place], [place]  [address]       Ph: [phone]       Fax: [phone]   RxID:   [id number] ANALPRAM-HC 1-2.5 % CREA (HYDROCORTISONE ACE-PRAMOXINE) Apply to rectum two times a day as needed  #30 grams x 1   Entered by:   Lamona Curl CMA (AAMA)   Authorized by:   Hart Carwin MD    Signed by:   Lamona Curl CMA (AAMA) on 03/24/2010   Method used:   Electronically to        [facility] (retail)       [address]       [place], [place]  [address]       Ph: [phone]       Fax: [phone]   RxID:   [id number]

## 2010-04-08 NOTE — Letter (Signed)
[facility]  [facility] Office Note   Imported By: Lamona Curl CMA (AAMA) 03/31/2010 10:19:01  _____________________________________________________________________  External Attachment:    Type:   Image     Comment:   External Document

## 2010-04-08 NOTE — Letter (Signed)
Summary: [facility]  [facility] Office Note   Imported By: Lamona Curl CMA (AAMA) 03/31/2010 10:01:45  _____________________________________________________________________  External Attachment:    Type:   Image     Comment:   External Document

## 2010-04-08 NOTE — Letter (Signed)
Summary: EGD (Dr Evette Cristal)  EGD (Dr Evette Cristal)   Imported By: Lamona Curl CMA (AAMA) 03/31/2010 10:17:20  _____________________________________________________________________  External Attachment:    Type:   Image     Comment:   External Document

## 2010-04-08 NOTE — Letter (Signed)
Summary: COLON (Dr Evette Cristal)  COLON (Dr Evette Cristal)   Imported By: Lamona Curl CMA (AAMA) 03/31/2010 09:58:14  _____________________________________________________________________  External Attachment:    Type:   Image     Comment:   External Document

## 2010-04-09 ENCOUNTER — Encounter: Payer: Self-pay | Admitting: Internal Medicine

## 2010-04-09 ENCOUNTER — Ambulatory Visit (INDEPENDENT_AMBULATORY_CARE_PROVIDER_SITE_OTHER): Payer: 59 | Admitting: Internal Medicine

## 2010-04-09 VITALS — BP 120/80 | HR 81 | Temp 97.7°F | Wt 144.0 lb

## 2010-04-09 DIAGNOSIS — I10 Essential (primary) hypertension: Secondary | ICD-10-CM

## 2010-04-09 DIAGNOSIS — J019 Acute sinusitis, unspecified: Secondary | ICD-10-CM

## 2010-04-09 DIAGNOSIS — J209 Acute bronchitis, unspecified: Secondary | ICD-10-CM

## 2010-04-09 MED ORDER — AMOXICILLIN-POT CLAVULANATE 875-125 MG PO TABS: 1.0000 | ORAL_TABLET | Freq: Two times a day (BID) | ORAL | Status: AC

## 2010-04-09 NOTE — Patient Instructions (Signed)
This is a viral URI  With sinus involvement.   If not improving in the next couple days  Begin the antibiotic  Take decongestant and saline nose spray for  Moisturizing.     Cough may continue for a week or so.   Call if fever or  shortness of breath

## 2010-04-09 NOTE — Progress Notes (Signed)
  Subjective:    Patient ID: Sheila Livingston, female    DOB: August 28, 1945, 65 y.o.   MRN: [id number]  HPI Patient comes in today for an acute visit. She is had respiratory symptoms for the last 6 to 7 days and had fever and chills yesterday. She said they were on set with burning in her throat and then developed a cough she has some upper respiratory congestion also with some facial pressure. She has a history of seasonal allergic rhinitis but does not think that that is the problem. She is taking no over-the-counter Sudafed and NyQuil type medicines. She's to go out of town soon is concerned about this not getting better before she goes.   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  Mouth : OP clear without lesion or edema . Neck without adenopathy post head 1 cm nodule Ln vs cyst nontender mobile and superficial Chest:  Clear to A&P without wheezes rales or rhonchi  bs =  CV:  S1-S2 no gallops or murmurs peripheral perfusion is normal Neurologic is intact and nonfocal. Extremities no clubbing cyanosis or edema        Assessment & Plan:  Sinusitis bronchitis this still could be viral but  Because of length and illness fever yesterday and severity if not improving in the next couple days she should add an Antibiotic as we discussed get help for alarm features.  I do not think the nodule in the back of her neck is important it may be assist  versus a reactive very small lymph node get back with [place] if progressing.  Hypertension controlled

## 2010-04-12 ENCOUNTER — Encounter: Payer: Self-pay | Admitting: Internal Medicine

## 2010-05-13 ENCOUNTER — Ambulatory Visit: Payer: 59 | Admitting: Internal Medicine

## 2010-05-16 ENCOUNTER — Encounter: Payer: Self-pay | Admitting: Internal Medicine

## 2010-05-16 ENCOUNTER — Ambulatory Visit (INDEPENDENT_AMBULATORY_CARE_PROVIDER_SITE_OTHER): Payer: 59 | Admitting: Internal Medicine

## 2010-05-16 DIAGNOSIS — K602 Anal fissure, unspecified: Secondary | ICD-10-CM

## 2010-05-16 DIAGNOSIS — Z8742 Personal history of other diseases of the female genital tract: Secondary | ICD-10-CM

## 2010-05-16 DIAGNOSIS — R1032 Left lower quadrant pain: Secondary | ICD-10-CM

## 2010-05-16 DIAGNOSIS — L290 Pruritus ani: Secondary | ICD-10-CM

## 2010-05-16 NOTE — Progress Notes (Signed)
Sheila Livingston [birth year] MRN [id number]   History of Present Illness:  This is a 65 year old white female with an anal fissure diagnosed at her last office visit on 03/24/2010. She has been treated with Analpram cream 2.5% and a high fiber diet with resolution of the fissure. She now has rectal itching and pain. She has complained of skin irritation around the anal area which has not improved. The topical steroids make it somewhat better. She has a history of  endometriosis and infertility. She is status post TAH/BSO. She is on estrogen replacement.  Her last colonoscopy in January 2005 by Dr Evette Cristal showed a  polyp of the colon. Her recall colon was placed 10 years.  Her sister also has polyps. Her symptoms are suggestive of a rectocele because she has to use manual pressure to evacuate the stool. She also has mild urinary stress incontinence.   Past Medical History  Diagnosis Date  . Hypertension   . Hyperplastic colon polyp   . Allergy   . Endometriosis   . IBS (irritable bowel syndrome)   . History of skin cancer   . Anal fissure   . Hemorrhoids   . Hyperlipidemia   . Benign paroxysmal positional vertigo    Past Surgical History  Procedure Date  . Abdominal hysterectomy     bso enometriosis no cancer  . Bunionectomy 11/23/2008  . Rt shoulder surgery 10 2011    reports that she has quit smoking. She does not have any smokeless tobacco history on file. She reports that she does not drink alcohol or use illicit drugs. family history includes Breast cancer in her sister; Colon polyps in her sisters; Diabetes in her mother and sister; and Stroke in her mother.  There is no history of Colon cancer. Allergies  Allergen Reactions  . Fluticasone Propionate     REACTION: Facial swelling under eyes  . Lisinopril     REACTION: dizziness and light headed  . Loperamide Hcl     REACTION: passed out        Review of Systems: Denies chest pain, shortness of breath, dysphagia or odynophagia.  Positive for low abdominal pain and mild constipation.  The remainder of the 10  point ROS is negative except as outlined in H&P   Physical Exam: General appearance  Well developed, in no distress. Eyes- non icteric. HEENT nontraumatic, normocephalic. Mouth no lesions, tongue papillated, no cheilosis. Neck supple without adenopathy, thyroid not enlarged, no carotid bruits, no JVD. Lungs Clear to auscultation bilaterally. Cor normal S1 normal S2, regular rhythm , no murmur,  quiet precordium. Abdomen soft abdomen with normal active bowel sounds. Tenderness in right lower quadrant and across the suprapubic area. There is no rebound, there is no palpable mass. Rectal: Intense erythema of the skin with scaling around the anal area. There is no evidence of drainage fistula. Anal canal is normal. The anal fissure has healed. Stool is Hemoccult negative. Extremities no pedal edema. Skin scaly lesion in the occipital area over scalloped. There is a small area of the dermatitis in the right ear. Neurological alert and oriented x 3. Psychological normal mood and affect.  Assessment and Plan:  Problems #1 Anal fissure. This has healed with Analpram cream. Patient has been advised to use a high-fiber diet, prune juice or stool softeners to maintain regular bowel habits. She is due for a recall colonoscopy in 2015.   problem #2 dermatitis around the anal area. This may be eczema,lichen planus, psoriasis or some other  dermatological condition which needs to be further evaluated, especially in the setting of having two other areas elsewhere on the body. This is not likely a candida  dermatitis since it has improved with topical steroids. There is no clinical evidence of Crohn's disease of the rectum. I would like her to see her dermatologist, Dr Allene Dillon for the perianal rash.  Problem #3 Abdominal pain with possible pelvic adhesions. We will obtain a pelvic ultrasound.      05/16/2010 [name]

## 2010-05-16 NOTE — Patient Instructions (Addendum)
You have been scheduled for a Pelvic ultrasound at [facility] on Wednesday 05/21/10. You should arrive at 1:45 pm for registration. Make certain to drink at lease 32 ounces of water 1 hour prior to your test and DO NOT void 1 hour prior to the test. CC: Dr Campbell Stall

## 2010-05-21 ENCOUNTER — Other Ambulatory Visit: Payer: Self-pay | Admitting: Internal Medicine

## 2010-05-21 ENCOUNTER — Ambulatory Visit (HOSPITAL_COMMUNITY)
Admission: RE | Admit: 2010-05-21 | Discharge: 2010-05-21 | Disposition: A | Discharge status: Home / Self Care | Payer: 59 | Source: Ambulatory Visit | Attending: Internal Medicine | Admitting: Internal Medicine

## 2010-05-21 DIAGNOSIS — Z8742 Personal history of other diseases of the female genital tract: Secondary | ICD-10-CM | POA: Insufficient documentation

## 2010-05-21 DIAGNOSIS — R1032 Left lower quadrant pain: Secondary | ICD-10-CM | POA: Insufficient documentation

## 2010-05-21 DIAGNOSIS — Z90710 Acquired absence of both cervix and uterus: Secondary | ICD-10-CM | POA: Insufficient documentation

## 2010-05-22 ENCOUNTER — Telehealth: Payer: Self-pay | Admitting: *Deleted

## 2010-05-22 NOTE — Telephone Encounter (Signed)
Message copied by Jesse Fall on Thu May 22, 2010  9:36 AM ------      Message from: [name]      Created: Wed May 21, 2010 11:25 PM       Please call pt with normal pelvic ultrasound, no cancer. Ovaries and uterus surgically absent.

## 2010-05-22 NOTE — Telephone Encounter (Signed)
Left message patient to call me. 

## 2010-05-22 NOTE — Telephone Encounter (Signed)
Message copied by Jesse Fall on Thu May 22, 2010 12:46 PM ------      Message from: [name]      Created: Wed May 21, 2010 11:25 PM       Please call pt with normal pelvic ultrasound, no cancer. Ovaries and uterus surgically absent.

## 2010-05-22 NOTE — Telephone Encounter (Signed)
Patient notified of lab results as per Dr. Brodie 

## 2010-06-13 NOTE — Assessment & Plan Note (Signed)
[facility] OFFICE NOTE   NAME:Sheila Livingston, Sheila Livingston                          MRN:          [id number]  DATE:11/23/2005                            DOB:          Apr 08, 1945    NEW PATIENT VISIT.   CHIEF COMPLAINT:  Sore throat, cough, congestion.   HISTORY OF PRESENT ILLNESS:  Sheila Livingston is a 65 year old nonsmoking, recently  widowed, white female who comes in today for a first time visit.  She  __________ specialist has no primary care physician and has an appointment  to establish with [place], became ill in the interim.  She was in her usual state  of health until about 4 or 5 days ago after she flew to [place], had onset  of sore throat and now nasal congestion, postnasal drainage, cough and  hoarseness, is concerned about getting a sinus infection.  Denies any  specific face pain or fever or shortness of breath but has a remote history  of some type of perforated sinus problem.  She has not been treated for  sinusitis for up to a year or so.   PAST MEDICAL HISTORY:  See database.   SURGERIES:  1. Hysterectomy in 1975 for endometriosis.  2. Polypectomy, polyps in colon.  3. Colonoscopy was in 2006, by Dr. Evette Cristal.   Hypertension, on medication for about 2 years.  History of fainting spells  once in high school, perimenstrual and secondary to Imodium.  Last Pap smear  was February, 2007.  She is primiparous.   GYN:  Dr. Randell Patient.   LAST MAMMOGRAM:  June, 2007.   LAST TETANUS SHOT:  2007.   SOCIAL HISTORY:  She was widowed in December of 2007.  Her husband had type  2 diabetes and had complications of the above and died with associated  pneumonia.  She has no current tobacco use, rare alcohol.  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   FAMILY HISTORY:  Hypertension in Mom, grandparents.  Father died of suicide  at age 30.  Mom history of stroke.  Type 2 diabetes in some aunts, type 1 in  sister.  Other relatives have bladder cancer, pancreatic cancer.  Maternal  aunt with breast cancer and one with lung cancer.   MEDICATIONS:  1. Premarin 0.425 a day.  2. Benicar 20 mg a day.  3. Aspirin 81 mg a day.  4. Multivitamins.  5. Flaxseed, fish oil, Advil and Sudafed as needed.   DRUG ALLERGIES:  IMODIUM CAUSED SYNCOPE.  OTHERWISE, NONE KNOWN.   REVIEW OF SYSTEMS:  Negative for chest pain, shortness of breath.  GI/GU:  Negative.  Rest as per HPI.   OBJECTIVE:  Height 65-1/2 inches, weight 158, temperature 98.5, pulse 74 and  regular, blood pressure 120/70.  This is a well-developed, well-nourished,  healthy appearing lady in no acute distress except for some obvious nasal  congestion and some hoarseness.  HEENT:  Normocephalic.  TMs clear.  Eyes PERRL.  Sclera nonicteric.  Nares  +1 to +2 turbinates,  mucoid discharge.  OP clear.  Face nontender at present  to percussion.  NECK:  Supple without masses, thyromegaly or bruit.  CHEST:  __________, equal.  CARDIAC:  S1, S2, no gallops or murmurs.  SKIN:  Color good, no acute rashes.   IMPRESSION:  Upper respiratory infection, most likely viral with some sinus  involvement with history of underlying sinusitis.   At this point, expectant management, nasal saline, continue pseudoephedrine.  If prolonged symptoms or localized face pain, may treat for bacterial  sinusitis with amoxicillin 875 one p.o. t.i.d.  She has prescription to hold  and call as needed.  She will follow up with [place] on her regular visit or as  needed.    ______________________________  Neta Mends. Fabian Sharp, MD    WKP/MedQ  DD: 11/23/2005  DT: 11/24/2005  Job #: [id number]

## 2010-07-22 ENCOUNTER — Encounter: Payer: Self-pay | Admitting: Internal Medicine

## 2010-08-18 ENCOUNTER — Encounter (HOSPITAL_BASED_OUTPATIENT_CLINIC_OR_DEPARTMENT_OTHER)
Admission: RE | Admit: 2010-08-18 | Discharge: 2010-08-18 | Disposition: A | Discharge status: Home / Self Care | Payer: 59 | Source: Ambulatory Visit | Attending: Orthopedic Surgery | Admitting: Orthopedic Surgery

## 2010-08-18 LAB — BASIC METABOLIC PANEL
Calcium: 9.8 mg/dL (ref 8.4–10.5)
Chloride: 101 mEq/L (ref 96–112)
Creatinine, Ser: 0.8 mg/dL (ref 0.50–1.10)
GFR calc Af Amer: >60 mL/min (ref >60)
GFR calc non Af Amer: >60 mL/min (ref >60)

## 2010-08-20 ENCOUNTER — Other Ambulatory Visit: Payer: Self-pay | Admitting: Orthopedic Surgery

## 2010-08-20 ENCOUNTER — Ambulatory Visit (HOSPITAL_BASED_OUTPATIENT_CLINIC_OR_DEPARTMENT_OTHER)
Admission: RE | Admit: 2010-08-20 | Discharge: 2010-08-20 | Disposition: A | Discharge status: Home / Self Care | Payer: 59 | Source: Ambulatory Visit | Attending: Orthopedic Surgery | Admitting: Orthopedic Surgery

## 2010-08-20 DIAGNOSIS — D2360 Other benign neoplasm of skin of unspecified upper limb, including shoulder: Secondary | ICD-10-CM | POA: Insufficient documentation

## 2010-08-20 DIAGNOSIS — Z01812 Encounter for preprocedural laboratory examination: Secondary | ICD-10-CM | POA: Insufficient documentation

## 2010-08-20 DIAGNOSIS — I10 Essential (primary) hypertension: Secondary | ICD-10-CM | POA: Insufficient documentation

## 2010-08-20 LAB — POCT HEMOGLOBIN-HEMACUE: Hemoglobin: 12.8 g/dL (ref 12.0–15.0)

## 2010-08-25 LAB — ANAEROBIC CULTURE

## 2010-10-28 NOTE — Op Note (Signed)
  NAMELASHANDA, Sheila Livingston NO.:  [id number]  MEDICAL RECORD NO.:  [id number]  LOCATION:                                 FACILITY:  PHYSICIAN:  Cindee Salt, [name]       DATE OF BIRTH:  September 11, 1945  DATE OF PROCEDURE:  08/20/2010 DATE OF DISCHARGE:                              OPERATIVE REPORT   PREOPERATIVE DIAGNOSIS:  Melanotic lesion, right thumb nail bed.  POSTOPERATIVE DIAGNOSIS:  Melanotic lesion, right thumb nail bed.  OPERATION:  Biopsy of melanotic lesion, right thumb nail bed.  SURGEON:  Cindee Salt, MD  ANESTHESIA:  Forearm-based IV regional.  ANESTHESIOLOGIST:  Guadalupe Maple, MD  HISTORY:  The patient is a 65 year old female with a history of melanotic stripe going down her right thumb nail matrix.  She is referred by Dermatology for biopsy.  Pre, peri, and postoperative course have been discussed along with risks and complications.  She is aware that there is no guarantee with surgery, possibility of infection, recurrence injury to arteries, nerves, and tendons, incomplete relief of symptoms, recurrence, and deformity to the nail plate.  In the preoperative area, the patient is seen, the extremity is marked by both the patient and surgeon, and antibiotic is given.  PROCEDURE:  The patient was brought to the operating room where a forearm-based IV regional anesthetic was carried out without difficulty. She was prepped using ChloraPrep, supine position, right arm free.  A 3- minute dry time was allowed.  A time-out was taken confirming the patient and procedure.  A metacarpal block was given with 0.25% Marcaine without epinephrine, 7 mL was used.  The nail plate was then removed after adequate anesthesia was afforded to the patient.  The area of the melanotic stripe was marked proximally.  This was easily visualized and was found to be under the dorsal fold with retractors placed.  The area was able to be biopsied with a small Beaver blade in an  elliptical manner.  The specimen was sent to Pathology.  Two other small lesions were also noted.  These were not biopsied at this time.  The wound was copiously irrigated with saline.  Cultures were taken for both aerobic and anaerobic and fungal cultures.  The nail matrix was then repaired with interrupted 6-0 chromic sutures.  A nonadherent gauze was placed out between the nail fold.  A sterile compressive dressing and splint was applied.  On deflation of the tourniquet, the remaining fingers pinked.  She was taken to the recovery room for observation in satisfactory condition.  She will be discharged home to return the [facility] in 1 week on Vicodin.          ______________________________ Cindee Salt, [name]     GK/MEDQ  D:  08/20/2010  T:  08/20/2010  Job:  [id number]  cc:   Hope M. Danella Deis, [name]  Electronically Signed by Cindee Salt [name] on 10/28/2010 04:36:16 PM

## 2010-10-31 ENCOUNTER — Telehealth: Payer: Self-pay | Admitting: *Deleted

## 2010-10-31 NOTE — Telephone Encounter (Signed)
Pt wants to taper off the Vivelle Dot patches. She is using the patches once a week on fridays.

## 2010-11-03 NOTE — Telephone Encounter (Signed)
Pt aware of this. 

## 2010-11-03 NOTE — Telephone Encounter (Signed)
She can try stopping  By extending the time  Perhaps 10 days or so. If having a problem can rx a lowest dose patch .025  To try once a week.

## 2010-12-16 ENCOUNTER — Other Ambulatory Visit: Payer: Self-pay | Admitting: Dermatology

## 2011-01-07 ENCOUNTER — Other Ambulatory Visit (INDEPENDENT_AMBULATORY_CARE_PROVIDER_SITE_OTHER): Payer: 59

## 2011-01-07 DIAGNOSIS — Z0000 Encounter for general adult medical examination without abnormal findings: Secondary | ICD-10-CM

## 2011-01-07 LAB — HEPATIC FUNCTION PANEL
AST: 26 U/L (ref 0–37)
Albumin: 4 g/dL (ref 3.5–5.2)
Alkaline Phosphatase: 68 U/L (ref 39–117)
Bilirubin, Direct: 0 mg/dL (ref 0.0–0.3)

## 2011-01-07 LAB — BASIC METABOLIC PANEL
Calcium: 9.6 mg/dL (ref 8.4–10.5)
Creatinine, Ser: 1 mg/dL (ref 0.4–1.2)
GFR: 58.67 mL/min — ABNORMAL LOW (ref >60.00)
Sodium: 143 mEq/L (ref 135–145)

## 2011-01-07 LAB — CBC WITH DIFFERENTIAL/PLATELET
Basophils Absolute: 0 10*3/uL (ref 0.0–0.1)
Basophils Relative: 0.5 % (ref 0.0–3.0)
Hemoglobin: 12.8 g/dL (ref 12.0–15.0)
Lymphocytes Relative: 40.2 % (ref 12.0–46.0)
Monocytes Relative: 7.9 % (ref 3.0–12.0)
Neutro Abs: 2.5 10*3/uL (ref 1.4–7.7)
Neutrophils Relative %: 48.1 % (ref 43.0–77.0)
RBC: 4.02 Mil/uL (ref 3.87–5.11)

## 2011-01-07 LAB — LDL CHOLESTEROL, DIRECT: Direct LDL: 134.1 mg/dL

## 2011-01-07 LAB — POCT URINALYSIS DIPSTICK
Glucose, UA: NEGATIVE
Leukocytes, UA: NEGATIVE
Nitrite, UA: NEGATIVE
Protein, UA: NEGATIVE
Spec Grav, UA: 1.01
Urobilinogen, UA: 0.2

## 2011-01-07 LAB — LIPID PANEL
HDL: 76.4 mg/dL (ref >39.00)
VLDL: 12 mg/dL (ref 0.0–40.0)

## 2011-01-07 LAB — TSH: TSH: 1.16 u[IU]/mL (ref 0.35–5.50)

## 2011-01-08 NOTE — Progress Notes (Signed)
Quick Note:  Pt aware of results. ______ 

## 2011-01-08 NOTE — Progress Notes (Signed)
Quick Note:  Left message to call back. ______ 

## 2011-01-14 ENCOUNTER — Encounter: Payer: Self-pay | Admitting: Internal Medicine

## 2011-01-21 ENCOUNTER — Encounter: Payer: Self-pay | Admitting: Internal Medicine

## 2011-01-23 ENCOUNTER — Encounter: Payer: Self-pay | Admitting: Internal Medicine

## 2011-01-23 ENCOUNTER — Ambulatory Visit (INDEPENDENT_AMBULATORY_CARE_PROVIDER_SITE_OTHER): Payer: 59 | Admitting: Internal Medicine

## 2011-01-23 VITALS — BP 130/80 | HR 66 | Ht 64.25 in | Wt 146.0 lb

## 2011-01-23 DIAGNOSIS — J301 Allergic rhinitis due to pollen: Secondary | ICD-10-CM

## 2011-01-23 DIAGNOSIS — J069 Acute upper respiratory infection, unspecified: Secondary | ICD-10-CM

## 2011-01-23 DIAGNOSIS — I10 Essential (primary) hypertension: Secondary | ICD-10-CM

## 2011-01-23 DIAGNOSIS — N951 Menopausal and female climacteric states: Secondary | ICD-10-CM

## 2011-01-23 DIAGNOSIS — L940 Localized scleroderma [morphea]: Secondary | ICD-10-CM

## 2011-01-23 DIAGNOSIS — E875 Hyperkalemia: Secondary | ICD-10-CM | POA: Insufficient documentation

## 2011-01-23 DIAGNOSIS — Z136 Encounter for screening for cardiovascular disorders: Secondary | ICD-10-CM

## 2011-01-23 DIAGNOSIS — L900 Lichen sclerosus et atrophicus: Secondary | ICD-10-CM

## 2011-01-23 DIAGNOSIS — Z0000 Encounter for general adult medical examination without abnormal findings: Secondary | ICD-10-CM

## 2011-01-23 LAB — BASIC METABOLIC PANEL
BUN: 14 mg/dL (ref 6–23)
Calcium: 9.8 mg/dL (ref 8.4–10.5)
Chloride: 102 mEq/L (ref 96–112)
Creatinine, Ser: 0.8 mg/dL (ref 0.4–1.2)
GFR: 74.81 mL/min (ref >60.00)

## 2011-01-23 NOTE — Progress Notes (Signed)
Subjective:    Patient ID: Sheila Livingston, female    DOB: 10/13/1945, 65 y.o.   MRN: [id number]  HPI  Patient comes in today for preventive visit and follow-up of medical issues. Update of her history since her last visit. Cough and sore throat for a few days and now coughing  No fever or sob  Gruber  Nail removal bx negative  Right hand  EYE exam to get recheck  Thurmon.  Bp seems ok  But  Borderline at work.  Dr Juanda Chance  And gruber checked her rectal area. Lichen sclerosis  And  On a cream for this.  Hearing:  Ok   Vision:  No limitations at present .  Safety:  Has smoke detector and wears seat belts.  No firearms. No excess sun exposure. Sees dentist regularly.  Falls:  No   Advance directive :  Reviewed  Has one.  Memory: Felt to be good  , no concern from her or her family.  Depression: No anhedonia unusual crying or depressive symptoms  Nutrition: Eats well balanced diet; adequate calcium and vitamin D. No swallowing chewiing problems.  Injury: no major injuries in the last six months.  Other healthcare providers:  Reviewed today .  Social:  Lives alone dog and cat.   Works 40 - 45   Sleep 6 - 7 hours    Dances on fridays   Preventive parameters: up-to-date on colonoscopy, mammogram, immunizations. Including Tdap and pneumovax.  ADLS:   There are no problems or need for assistance  driving, feeding, obtaining food, dressing, toileting and bathing, managing money using phone. She is independent.    Review of Systems ROS:  GEN/ HEENTNo fever, significant weight changes sweats headaches vision problems hearing changes, CV/ PULM; No chest pain shortness of breath , syncope,edema  change in exercise tolerance. GI /GU: No adominal pain, vomiting, change in bowel habits. No blood in the stool. No significant GU symptoms. SKIN/HEME: ,no  bleeding. No lymphadenopathy, nodules, masses.  NEURO/ PSYCH:  No neurologic signs such as weakness numbness No depression anxiety. IMM/  Allergy: No unusual infections.  Allergy .   Rest as per hpi  REST of 12 system review negative  Past history family history social history reviewed in the electronic medical record.      Objective:   Physical Exam Physical Exam: Vital signs reviewed HQI:ONGE is a well-developed well-nourished alert cooperative  white female who appears her stated age in no acute distress.   Congested    HEENT: normocephalic atraumatic , Eyes: PERRL EOM's full, conjunctiva clear, Nares: paten,t no deformity  or tenderness.  Congested  , Ears: no deformity EAC's clear TMs with normal landmarks. Mouth: clear OP, no lesions, edema.  Moist mucous membranes. Dentition in adequate repair. NECK: supple without masses, thyromegaly or bruits. CHEST/PULM:  Clear to auscultation and percussion breath sounds equal no wheeze , rales or rhonchi. No chest Single deformities or tenderness. CV: PMI is nondisplaced, S1 S2 no gallops, murmurs, rubs. Peripheral pulses are full without delay.No JVD .  Breast: normal by inspection . No dimpling, discharge, masses, tenderness or discharge .  ABDOMEN: Bowel sounds normal nontender  No guard or rebound, no hepato splenomegal no CVA tenderness.  No hernia. Extremtities:  No clubbing cyanosis or edema, no acute joint swelling or redness no focal atrophy NEURO:  Oriented x3, cranial nerves 3-12 appear to be intact, no obvious focal weakness,gait within normal limits no abnormal reflexes or asymmetrical SKIN: No acute rashes normal  turgor, color, no bruising or petechiae. PSYCH: Oriented, good eye contact, no obvious depression anxiety, cognition and judgment appear normal. LN: no cervical axillary inguinal adenopathy   EKG nsr no acute changes Lab reviewed  With pt      Assessment & Plan:  Preventive Health Care Counseled regarding healthy nutrition, exercise, sleep, injury prevention, calcium vit d and healthy weight .  HT controlled HRT  Disc  Weaning because of risk and will    Viral Acute URI uncomplicated at this time elevated Potassium prob from lab effect Lichen sclerosis per bx

## 2011-01-23 NOTE — Patient Instructions (Signed)
Ok to try off the  vivelle dot if needed  Call . There is a lower dose of med.  Will notify you  of labs when available.  If ok then check up in a year   mucinex dm  For cough if wish . But  Nothing works that well  I thibk you have a viral RTI that should resolve on its own in a week or so. Call if fever shortness of breath

## 2011-01-25 DIAGNOSIS — L900 Lichen sclerosus et atrophicus: Secondary | ICD-10-CM | POA: Insufficient documentation

## 2011-01-29 ENCOUNTER — Telehealth: Payer: Self-pay | Admitting: Internal Medicine

## 2011-01-29 NOTE — Telephone Encounter (Signed)
Potassium is now normal . Pt aware of results.

## 2011-01-29 NOTE — Progress Notes (Signed)
Quick Note:  Pt aware of results. ______ 

## 2011-01-29 NOTE — Telephone Encounter (Signed)
Pt called req to get lab results.  °

## 2011-02-10 ENCOUNTER — Ambulatory Visit (INDEPENDENT_AMBULATORY_CARE_PROVIDER_SITE_OTHER): Payer: 59 | Admitting: Family

## 2011-02-10 ENCOUNTER — Encounter: Payer: Self-pay | Admitting: Family

## 2011-02-10 VITALS — BP 120/70 | Temp 98.7°F | Wt 145.0 lb

## 2011-02-10 DIAGNOSIS — J069 Acute upper respiratory infection, unspecified: Secondary | ICD-10-CM

## 2011-02-10 DIAGNOSIS — R059 Cough, unspecified: Secondary | ICD-10-CM

## 2011-02-10 DIAGNOSIS — R05 Cough: Secondary | ICD-10-CM

## 2011-02-10 NOTE — Progress Notes (Signed)
Subjective:    Patient ID: Sheila Livingston, female    DOB: 1945/08/08, 66 y.o.   MRN: [id number]  HPI 66 year old white female, nonsmoker, patient of Dr. Fabian Sharp is in today with complaints of cough, congestion, sinus pressure that then going on since December 26. Her symptoms are worsening. She's been taking over-the-counter DayQuil without relief. She has an old prescription for Augmentin 875 that she has not taken.   Review of Systems  Constitutional: Negative.   HENT: Positive for congestion, postnasal drip and sinus pressure.   Respiratory: Positive for cough.   Cardiovascular: Negative.   Genitourinary: Negative.   Musculoskeletal: Negative.   Skin: Negative.   Neurological: Negative.   Hematological: Negative.   Psychiatric/Behavioral: Negative.    Past Medical History  Diagnosis Date  . Hypertension   . Hyperplastic colon polyp   . Allergy   . Endometriosis   . IBS (irritable bowel syndrome)   . History of skin cancer   . Anal fissure   . Hemorrhoids   . Hyperlipidemia   . Benign paroxysmal positional vertigo     History   Social History  . Marital Status: Widowed    Spouse Name: N/A    Number of Children: N/A  . Years of Education: N/A   Occupational History  . Not on file.   Social History Main Topics  . Smoking status: Former Games developer  . Smokeless tobacco: Not on file  . Alcohol Use: No  . Drug Use: No  . Sexually Active: Not on file   Other Topics Concern  . Not on file   Social History Narrative   WidowedDatingHH of 1 Dog and Cat6-7 hours of sleepMom passed away Dec 27, 2009   Past Surgical History  Procedure Date  . Abdominal hysterectomy     bso enometriosis no cancer  . Bunionectomy 11/23/2008  . Rt shoulder surgery 10 2011    Family History  Problem Relation Age of Onset  . Diabetes Mother   . Breast cancer Sister   . Colon polyps Sister   . Stroke Mother   . Diabetes Sister   . Colon cancer Neg Hx   . Colon polyps Sister      Allergies  Allergen Reactions  . Fluticasone Propionate     REACTION: Facial swelling under eyes  . Lisinopril     REACTION: dizziness and light headed  . Loperamide Hcl     REACTION: passed out    Current Outpatient Prescriptions on File Prior to Visit  Medication Sig Dispense Refill  . aspirin 81 MG chewable tablet Chew 81 mg by mouth daily.        . clobetasol ointment (TEMOVATE) 0.05 % Apply topically 2 (two) times daily.        Marland Kitchen desonide (DESOWEN) 0.05 % ointment Apply 1 application topically 2 (two) times daily.        Marland Kitchen estradiol (VIVELLE-DOT) 0.0375 MG/24HR Place 1 patch onto the skin 2 (two) times a week.        . fish oil-omega-3 fatty acids 1000 MG capsule Take 2 g by mouth daily.        . Flaxseed, Linseed, (FLAX SEED OIL PO) Take by mouth.        . hydrocortisone 2.5 % cream Apply topically 2 (two) times daily.        Marland Kitchen ibuprofen (ADVIL,MOTRIN) 200 MG tablet Take 200 mg by mouth every 6 (six) hours as needed.        Marland Kitchen  losartan (COZAAR) 50 MG tablet Take 1 tablet (50 mg total) by mouth daily.  90 tablet  3  . MULTIPLE VITAMIN PO Take by mouth.        . pramoxine-hydrocortisone (ANALPRAM HC) cream Apply topically 3 (three) times daily.        . pseudoephedrine (SUDAFED) 30 MG tablet Take 30 mg by mouth every 4 (four) hours as needed.        Marland Kitchen VITAMIN D, CHOLECALCIFEROL, PO Take by mouth.        . Wheat Dextrin (BENEFIBER PO) Take by mouth.          BP 120/70  Temp(Src) 98.7 F (37.1 C) (Oral)  Wt 145 lb (65.772 kg)chart    Objective:   Physical Exam  Constitutional: She is oriented to person, place, and time. She appears well-developed and well-nourished.  HENT:  Right Ear: External ear normal.  Left Ear: External ear normal.  Nose: Nose normal.  Mouth/Throat: Oropharynx is clear and moist.  Neck: Normal range of motion. Neck supple.  Cardiovascular: Normal rate, regular rhythm and normal heart sounds.   Pulmonary/Chest: Effort normal and breath sounds  normal.  Abdominal: Soft.  Musculoskeletal: Normal range of motion.  Neurological: She is alert and oriented to person, place, and time.  Skin: Skin is warm and dry.          Assessment & Plan:  Assessment: Upper respiratory infection, cough  Plan: Start Augmentin 875 one tablet twice a day x10 days. Allegra, Claritin, or Zyrtec once daily. Rest. Drink plenty of fluids. Call the office if symptoms worsen or persist. Recheck his schedule, and when necessary.

## 2011-02-10 NOTE — Patient Instructions (Signed)
1. Zyrtec, Claritin or Allegra once daily over the counter.   Upper Respiratory Infection, Adult An upper respiratory infection (URI) is also sometimes known as the common cold. The upper respiratory tract includes the nose, sinuses, throat, trachea, and bronchi. Bronchi are the airways leading to the lungs. Most people improve within 1 week, but symptoms can last up to 2 weeks. A residual cough may last even longer.  CAUSES Many different viruses can infect the tissues lining the upper respiratory tract. The tissues become irritated and inflamed and often become very moist. Mucus production is also common. A cold is contagious. You can easily spread the virus to others by oral contact. This includes kissing, sharing a glass, coughing, or sneezing. Touching your mouth or nose and then touching a surface, which is then touched by another person, can also spread the virus. SYMPTOMS  Symptoms typically develop 1 to 3 days after you come in contact with a cold virus. Symptoms vary from person to person. They may include:  Runny nose.   Sneezing.   Nasal congestion.   Sinus irritation.   Sore throat.   Loss of voice (laryngitis).   Cough.   Fatigue.   Muscle aches.   Loss of appetite.   Headache.   Low-grade fever.  DIAGNOSIS  You might diagnose your own cold based on familiar symptoms, since most people get a cold 2 to 3 times a year. Your caregiver can confirm this based on your exam. Most importantly, your caregiver can check that your symptoms are not due to another disease such as strep throat, sinusitis, pneumonia, asthma, or epiglottitis. Blood tests, throat tests, and X-rays are not necessary to diagnose a common cold, but they may sometimes be helpful in excluding other more serious diseases. Your caregiver will decide if any further tests are required. RISKS AND COMPLICATIONS  You may be at risk for a more severe case of the common cold if you smoke cigarettes, have chronic  heart disease (such as heart failure) or lung disease (such as asthma), or if you have a weakened immune system. The very young and very old are also at risk for more serious infections. Bacterial sinusitis, middle ear infections, and bacterial pneumonia can complicate the common cold. The common cold can worsen asthma and chronic obstructive pulmonary disease (COPD). Sometimes, these complications can require emergency medical care and may be life-threatening. PREVENTION  The best way to protect against getting a cold is to practice good hygiene. Avoid oral or hand contact with people with cold symptoms. Wash your hands often if contact occurs. There is no clear evidence that vitamin C, vitamin E, echinacea, or exercise reduces the chance of developing a cold. However, it is always recommended to get plenty of rest and practice good nutrition. TREATMENT  Treatment is directed at relieving symptoms. There is no cure. Antibiotics are not effective, because the infection is caused by a virus, not by bacteria. Treatment may include:  Increased fluid intake. Sports drinks offer valuable electrolytes, sugars, and fluids.   Breathing heated mist or steam (vaporizer or shower).   Eating chicken soup or other clear broths, and maintaining good nutrition.   Getting plenty of rest.   Using gargles or lozenges for comfort.   Controlling fevers with ibuprofen or acetaminophen as directed by your caregiver.   Increasing usage of your inhaler if you have asthma.  Zinc gel and zinc lozenges, taken in the first 24 hours of the common cold, can shorten the duration and  lessen the severity of symptoms. Pain medicines may help with fever, muscle aches, and throat pain. A variety of non-prescription medicines are available to treat congestion and runny nose. Your caregiver can make recommendations and may suggest nasal or lung inhalers for other symptoms.  HOME CARE INSTRUCTIONS   Only take over-the-counter or  prescription medicines for pain, discomfort, or fever as directed by your caregiver.   Use a warm mist humidifier or inhale steam from a shower to increase air moisture. This may keep secretions moist and make it easier to breathe.   Drink enough water and fluids to keep your urine clear or pale yellow.   Rest as needed.   Return to work when your temperature has returned to normal or as your caregiver advises. You may need to stay home longer to avoid infecting others. You can also use a face mask and careful hand washing to prevent spread of the virus.  SEEK MEDICAL CARE IF:   After the first few days, you feel you are getting worse rather than better.   You need your caregiver's advice about medicines to control symptoms.   You develop chills, worsening shortness of breath, or brown or red sputum. These may be signs of pneumonia.   You develop yellow or brown nasal discharge or pain in the face, especially when you bend forward. These may be signs of sinusitis.   You develop a fever, swollen neck glands, pain with swallowing, or white areas in the back of your throat. These may be signs of strep throat.  SEEK IMMEDIATE MEDICAL CARE IF:   You have a fever.   You develop severe or persistent headache, ear pain, sinus pain, or chest pain.   You develop wheezing, a prolonged cough, cough up blood, or have a change in your usual mucus (if you have chronic lung disease).   You develop sore muscles or a stiff neck.  Document Released: 07/08/2000 Document Revised: 09/24/2010 Document Reviewed: 05/16/2010 [facility] Patient Information 2012 [place], [place].

## 2011-02-23 ENCOUNTER — Other Ambulatory Visit: Payer: Self-pay | Admitting: Family

## 2011-02-23 ENCOUNTER — Telehealth: Payer: Self-pay | Admitting: Internal Medicine

## 2011-02-23 MED ORDER — PREDNISONE 20 MG PO TABS: ORAL_TABLET | ORAL | Status: AC

## 2011-02-23 NOTE — Telephone Encounter (Signed)
I have faxed a RX for prednisone to [facility] for her.

## 2011-02-23 NOTE — Telephone Encounter (Signed)
Pt was seen on 02-10-2011 by NP. Pt finished abx Friday still having symptoms sore throat/swollen glands/ear pressure. Pt also was exposure to ?asbestos on Saturday. Please advise. [facility]

## 2011-02-23 NOTE — Telephone Encounter (Signed)
Pt aware Rx sent to [facility]

## 2011-05-18 ENCOUNTER — Other Ambulatory Visit: Payer: Self-pay | Admitting: Internal Medicine

## 2011-05-20 ENCOUNTER — Ambulatory Visit (INDEPENDENT_AMBULATORY_CARE_PROVIDER_SITE_OTHER): Payer: 59 | Admitting: Internal Medicine

## 2011-05-20 ENCOUNTER — Encounter: Payer: Self-pay | Admitting: Internal Medicine

## 2011-05-20 VITALS — BP 112/68 | HR 73 | Temp 98.4°F | Wt 148.0 lb

## 2011-05-20 DIAGNOSIS — J40 Bronchitis, not specified as acute or chronic: Secondary | ICD-10-CM

## 2011-05-20 DIAGNOSIS — J988 Other specified respiratory disorders: Secondary | ICD-10-CM

## 2011-05-20 MED ORDER — AZITHROMYCIN 250 MG PO TABS: 250.0000 mg | ORAL_TABLET | ORAL | Status: AC

## 2011-05-20 NOTE — Progress Notes (Signed)
  Subjective:    Patient ID: Sheila Livingston, female    DOB: 10-18-1945, 66 y.o.   MRN: [id number]  HPI Patient comes in today for SDA for  new problem evaluation. Onset st and then congestion and cough since  April 12 . Bad cough mostly day and not productive  And some sob going up stairs but no specific wheeze .  No fever no sinus pain  Or teeth ache.  [place] and needs opinion about interventions.  No fever chills  Cough green phlegm with speck of blood recently . No rx at this point.  Review of Systems No fever cp  Bleeding  Syncope.   No recent allergy asthma sx  Past history family history social history reviewed in the electronic medical record.     Objective:   Physical Exam  BP 112/68  Pulse 73  Temp(Src) 98.4 F (36.9 C) (Oral)  Wt 148 lb (67.132 kg)  SpO2 98% wdwn in na d WDWN in NAD  quiet respirations; mildly congested  somewhat hoarse. Non toxic . HEENT: Normocephalic ;atraumatic , Eyes;  PERRL, EOMs  Full, lids and conjunctiva clear,,Ears: no deformities, canals nl, TM landmarks normal, Nose: no deformity or discharge not that  congested;face non tender Mouth : OP clear without lesion or edema . Neck: Supple without adenopathy or masses or bruits Chest:   without  rales or rhonchi rare wheeze tubular sound that clears with cough CV:  S1-S2 no gallops or murmurs peripheral perfusion is normal Skin :nl perfusion and no acute rashes  Loose wheezy cough noted     Assessment & Plan:  Acute rti seems acute bronchitis   Poss viral however a few concerning signs and going out of town .  Sx rx but if continued blood in phlegm or worsening then start antibiotic  .  Fu as discusse d    Expectant management.

## 2011-05-20 NOTE — Patient Instructions (Signed)
This is an infection acute bronchitis which usually gets better on its own.  In a few weeks Howewever if shortness of breath and coughing up blood or worsening then add antibiotic for possible bacterial infection.   Acute Bronchitis You have acute bronchitis. This means you have a chest cold. The airways in your lungs are red and sore (inflamed). Acute means it is sudden onset.  CAUSES Bronchitis is most often caused by the same virus that causes a cold. SYMPTOMS   Body aches.   Chest congestion.   Chills.   Cough.   Fever.   Shortness of breath.   Sore throat.  TREATMENT  Acute bronchitis is usually treated with rest, fluids, and medicines for relief of fever or cough. Most symptoms should go away after a few days or a week. Increased fluids may help thin your secretions and will prevent dehydration. Your caregiver may give you an inhaler to improve your symptoms. The inhaler reduces shortness of breath and helps control cough. You can take over-the-counter pain relievers or cough medicine to decrease coughing, pain, or fever. A cool-air vaporizer may help thin bronchial secretions and make it easier to clear your chest. Antibiotics are usually not needed but can be prescribed if you smoke, are seriously ill, have chronic lung problems, are elderly, or you are at higher risk for developing complications.Allergies and asthma can make bronchitis worse. Repeated episodes of bronchitis may cause longstanding lung problems. Avoid smoking and secondhand smoke.Exposure to cigarette smoke or irritating chemicals will make bronchitis worse. If you are a cigarette smoker, consider using nicotine gum or skin patches to help control withdrawal symptoms. Quitting smoking will help your lungs heal faster. Recovery from bronchitis is often slow, but you should start feeling better after 2 to 3 days. Cough from bronchitis frequently lasts for 3 to 4 weeks. To prevent another bout of acute  bronchitis:  Quit smoking.   Wash your hands frequently to get rid of viruses or use a hand sanitizer.   Avoid other people with cold or virus symptoms.   Try not to touch your hands to your mouth, nose, or eyes.  SEEK IMMEDIATE MEDICAL CARE IF:  You develop increased fever, chills, or chest pain.   You have severe shortness of breath or bloody sputum.   You develop dehydration, fainting, repeated vomiting, or a severe headache.   You have no improvement after 1 week of treatment or you get worse.  MAKE SURE YOU:   Understand these instructions.   Will watch your condition.   Will get help right away if you are not doing well or get worse.  Document Released: 02/20/2004 Document Revised: 01/01/2011 Document Reviewed: 05/07/2010 [facility] Patient Information 2012 [place], [place].

## 2011-12-21 ENCOUNTER — Other Ambulatory Visit: Payer: Self-pay | Admitting: Dermatology

## 2012-01-11 ENCOUNTER — Other Ambulatory Visit (INDEPENDENT_AMBULATORY_CARE_PROVIDER_SITE_OTHER): Payer: 59

## 2012-01-11 DIAGNOSIS — Z0000 Encounter for general adult medical examination without abnormal findings: Secondary | ICD-10-CM

## 2012-01-12 LAB — BASIC METABOLIC PANEL
BUN: 15 mg/dL (ref 6–23)
Chloride: 103 mEq/L (ref 96–112)
GFR: 80.29 mL/min (ref >60.00)
Glucose, Bld: 89 mg/dL (ref 70–99)
Potassium: 3.8 mEq/L (ref 3.5–5.1)
Sodium: 140 mEq/L (ref 135–145)

## 2012-01-12 LAB — CBC WITH DIFFERENTIAL/PLATELET
Basophils Relative: 0.5 % (ref 0.0–3.0)
Eosinophils Relative: 3.8 % (ref 0.0–5.0)
Lymphocytes Relative: 47.4 % — ABNORMAL HIGH (ref 12.0–46.0)
MCV: 91.1 fl (ref 78.0–100.0)
Monocytes Absolute: 0.5 10*3/uL (ref 0.1–1.0)
Monocytes Relative: 9.2 % (ref 3.0–12.0)
Neutrophils Relative %: 39.1 % — ABNORMAL LOW (ref 43.0–77.0)
Platelets: 206 10*3/uL (ref 150.0–400.0)
RBC: 4.22 Mil/uL (ref 3.87–5.11)
WBC: 5.4 10*3/uL (ref 4.5–10.5)

## 2012-01-12 LAB — LIPID PANEL
HDL: 71.6 mg/dL (ref >39.00)
Total CHOL/HDL Ratio: 3
VLDL: 19.4 mg/dL (ref 0.0–40.0)

## 2012-01-12 LAB — LDL CHOLESTEROL, DIRECT: Direct LDL: 131.3 mg/dL

## 2012-01-12 LAB — HEPATIC FUNCTION PANEL
Albumin: 4.1 g/dL (ref 3.5–5.2)
Bilirubin, Direct: 0 mg/dL (ref 0.0–0.3)
Total Protein: 7.1 g/dL (ref 6.0–8.3)

## 2012-01-21 ENCOUNTER — Other Ambulatory Visit: Payer: Self-pay | Admitting: Internal Medicine

## 2012-01-25 ENCOUNTER — Ambulatory Visit (INDEPENDENT_AMBULATORY_CARE_PROVIDER_SITE_OTHER): Payer: 59 | Admitting: Internal Medicine

## 2012-01-25 ENCOUNTER — Encounter: Payer: Self-pay | Admitting: Internal Medicine

## 2012-01-25 VITALS — BP 124/66 | HR 66 | Temp 97.5°F | Ht 64.5 in | Wt 149.0 lb

## 2012-01-25 DIAGNOSIS — I10 Essential (primary) hypertension: Secondary | ICD-10-CM

## 2012-01-25 DIAGNOSIS — H01139 Eczematous dermatitis of unspecified eye, unspecified eyelid: Secondary | ICD-10-CM

## 2012-01-25 DIAGNOSIS — J310 Chronic rhinitis: Secondary | ICD-10-CM

## 2012-01-25 DIAGNOSIS — Z0000 Encounter for general adult medical examination without abnormal findings: Secondary | ICD-10-CM

## 2012-01-25 MED ORDER — MOMETASONE FUROATE 50 MCG/ACT NA SUSP: NASAL | Status: DC

## 2012-01-25 MED ORDER — PRAMOXINE-HC 1-1 % EX CREA: TOPICAL_CREAM | Freq: Three times a day (TID) | CUTANEOUS | Status: DC

## 2012-01-25 NOTE — Progress Notes (Signed)
Chief Complaint  Patient presents with  . Annual Exam    HPI: Patient comes in today for Preventive Medicare wellness visit . No major injuries, ed visits ,hospitalizations , new medications since last visit.  Some hot flushes  Since off hrt .  Eyes ok uncertain fo hearing.  Right eyue lid Asks for efill of rectal cream to use prn  utd on colonoscopy.   EXERCISE/ HABITS  Per week   No tobacco    etoh No falling no depression working full time now  [employer]   For a while and ocass teeth issues .  Took antibiotic a while back .    ROS:  GEN/ HEENT: No fever, significant weight changes sweats headaches vision problems hearing changes, CV/ PULM; No chest pain shortness of breath cough, syncope,edema  change in exercise tolerance. GI /GU: No adominal pain, vomiting, change in bowel habits. No blood in the stool. No significant GU symptoms. SKIN/HEME: ,no acute skin rashes suspicious lesions or bleeding. No lymphadenopathy, nodules, masses.  NEURO/ PSYCH:  No neurologic signs such as weakness numbness. No depression anxiety. IMM/ Allergy: No unusual infections.  Allergy .   REST of 12 system review negative except as per HPI   Past Medical History  Diagnosis Date  . Hypertension   . Hyperplastic colon polyp   . Allergy   . Endometriosis   . IBS (irritable bowel syndrome)   . History of skin cancer   . Anal fissure   . Hemorrhoids   . Hyperlipidemia   . Benign paroxysmal positional vertigo     Family History  Problem Relation Age of Onset  . Diabetes Mother   . Breast cancer Sister   . Colon polyps Sister   . Stroke Mother   . Diabetes Sister   . Colon cancer Neg Hx   . Colon polyps Sister     History   Social History  . Marital Status: Widowed    Spouse Name: N/A    Number of Children: N/A  . Years of Education: N/A   Social History Main Topics  . Smoking status: Former Games developer  . Smokeless tobacco: None  . Alcohol Use: No  . Drug Use: No  . Sexually  Active: None   Other Topics Concern  . None   Social History Narrative   WidowedDatingHH of 1 Dog and Cat6-7 hours of sleepMom passed away November 2011Working [employer]  38 - 43 Exercising Neg tad   ocass etoh.    Outpatient Encounter Prescriptions as of 01/25/2012  Medication Sig Dispense Refill  . aspirin 81 MG chewable tablet Chew 81 mg by mouth daily.        . Cholecalciferol (VITAMIN D3) 400 UNITS CAPS Take 1 capsule by mouth daily.      . clobetasol ointment (TEMOVATE) 0.05 % Apply topically 2 (two) times daily.        Marland Kitchen desonide (DESOWEN) 0.05 % ointment Apply 1 application topically 2 (two) times daily.        . fish oil-omega-3 fatty acids 1000 MG capsule Take 2 g by mouth daily.        . Flaxseed, Linseed, (FLAX SEED OIL PO) Take by mouth.        . hydrocortisone 2.5 % cream Apply topically 2 (two) times daily.        Marland Kitchen ibuprofen (ADVIL,MOTRIN) 200 MG tablet Take 200 mg by mouth every 6 (six) hours as needed.        Marland Kitchen  losartan (COZAAR) 50 MG tablet TAKE 1 TABLET DAILY  30 tablet  0  . MULTIPLE VITAMIN PO Take by mouth.        . pramoxine-hydrocortisone (ANALPRAM HC) cream Apply topically 3 (three) times daily.  30 g  2  . pseudoephedrine (SUDAFED) 30 MG tablet Take 30 mg by mouth every 4 (four) hours as needed.        . Wheat Dextrin (BENEFIBER PO) Take by mouth.        . [DISCONTINUED] pramoxine-hydrocortisone (ANALPRAM HC) cream Apply topically 3 (three) times daily.        . mometasone (NASONEX) 50 MCG/ACT nasal spray 2 sprays each nostril qd  17 g  3  . [DISCONTINUED] estradiol (VIVELLE-DOT) 0.0375 MG/24HR Place 1 patch onto the skin 2 (two) times a week.        . [DISCONTINUED] estradiol (VIVELLE-DOT) 0.0375 MG/24HR Place 1 patch onto the skin 2 (two) times a week.      . [DISCONTINUED] VITAMIN D, CHOLECALCIFEROL, PO Take by mouth.          EXAM:  BP 124/66  Pulse 66  Temp 97.5 F (36.4 C)  Ht 5' 4.5" (1.638 m)  Wt 149 lb (67.586 kg)  BMI 25.18 kg/m2  SpO2  99%  Body mass index is 25.18 kg/(m^2).  Physical Exam: Vital signs reviewed YNW:GNFA is a well-developed well-nourished alert cooperative   who appears stated age in no acute distress.  HEENT: normocephalic atraumatic , Eyes: PERRL EOM's full, conjunctiva clear, Nares: paten,t no deformity discharge or tenderness., Ears: no deformity EAC's clear TMs with normal landmarks. Mouth: clear OP, no lesions, edema.  Moist mucous membranes. Dentition in adequate repair. NECK: supple without masses, thyromegaly or bruits. CHEST/PULM:  Clear to auscultation and percussion breath sounds equal no wheeze , rales or rhonchi. No chest Muise deformities or tenderness. CV: PMI is nondisplaced, S1 S2 no gallops, murmurs, rubs. Peripheral pulses are full without delay.No JVD .  Breast: normal by inspection . No dimpling, discharge, masses, tenderness or discharge .  ABDOMEN: Bowel sounds normal nontender  No guard or rebound, no hepato splenomegal no CVA tenderness.  No hernia. Extremtities:  No clubbing cyanosis or edema, no acute joint swelling or redness no focal atrophy NEURO:  Oriented x3, cranial nerves 3-12 appear to be intact, no obvious focal weakness,gait within normal limits no abnormal reflexes or asymmetrical SKIN: No acute rashes normal turgor, color, no bruising or petechiae. Right eue lid with 1.5 cm patch dermaitis  PSYCH: Oriented, good eye contact, no obvious depression anxiety, cognition and judgment appear normal. LN: no cervical axillary inguinal adenopathy No noted deficits in memory, attention, and speech.   Lab Results  Component Value Date   WBC 5.4 01/11/2012   HGB 13.0 01/11/2012   HCT 38.4 01/11/2012   PLT 206.0 01/11/2012   GLUCOSE 89 01/11/2012   CHOL 207* 01/11/2012   TRIG 97.0 01/11/2012   HDL 71.60 01/11/2012   LDLDIRECT 131.3 01/11/2012   LDLCALC 87 05/04/2007   ALT 22 01/11/2012   AST 23 01/11/2012   NA 140 01/11/2012   K 3.8 01/11/2012   CL 103 01/11/2012    CREATININE 0.8 01/11/2012   BUN 15 01/11/2012   CO2 29 01/11/2012   TSH 1.30 01/11/2012    ASSESSMENT AND PLAN:  Discussed the following assessment and plan:  1. Visit for preventive health examination    utd check on zostavax  2. HYPERTENSION    continue same meds  3. Eyelid eczema right upper   4. Rhinitis    stuffy nose  nose bleed with flonase  hx of sinus surgery right    Patient Care Team: [name], MD as PCP - General Janifer Adie, MD (Obstetrics and Gynecology) Eugenia Mcalpine, MD (Orthopedic Surgery) [facility] Bjorn Loser, MD as Attending Physician (Dermatology) Hart Carwin, MD (Gastroenterology)  Patient Instructions  Continue lifestyle intervention healthy eating and exercise . Cant ry low dose cortisone cream on eye lid for a week twice a day if not getting better see eye coc or dermatologist  For advice . Try saline spray for moisturization  For nasla stuffiness and add nasal steroid as directed  . If  persistent or progressive consider seeing ent for evalution.  cpx in a year or as needed    Neta Mends. Jet Armbrust [name]  Health Maintenance  Topic Date Due  . Influenza Vaccine  09/27/2011  . Mammogram  07/15/2012  . Colonoscopy  01/26/2013  . Tetanus/tdap  01/27/2015  . Pneumococcal Polysaccharide Vaccine Age 37 And Over  Completed  . Zostavax  Completed   Health Maintenance Review {

## 2012-01-25 NOTE — Patient Instructions (Signed)
Continue lifestyle intervention healthy eating and exercise . Cant ry low dose cortisone cream on eye lid for a week twice a day if not getting better see eye coc or dermatologist  For advice . Try saline spray for moisturization  For nasla stuffiness and add nasal steroid as directed  . If  persistent or progressive consider seeing ent for evalution.  cpx in a year or as needed

## 2012-01-26 ENCOUNTER — Ambulatory Visit (INDEPENDENT_AMBULATORY_CARE_PROVIDER_SITE_OTHER): Payer: 59 | Admitting: Family Medicine

## 2012-01-26 DIAGNOSIS — Z2911 Encounter for prophylactic immunotherapy for respiratory syncytial virus (RSV): Secondary | ICD-10-CM

## 2012-01-26 DIAGNOSIS — Z23 Encounter for immunization: Secondary | ICD-10-CM

## 2012-02-15 ENCOUNTER — Other Ambulatory Visit: Payer: Self-pay | Admitting: Internal Medicine

## 2012-02-16 ENCOUNTER — Other Ambulatory Visit: Payer: Self-pay | Admitting: Family Medicine

## 2012-02-16 MED ORDER — MOMETASONE FUROATE 50 MCG/ACT NA SUSP: NASAL | Status: DC

## 2012-05-16 ENCOUNTER — Encounter: Payer: Self-pay | Admitting: *Deleted

## 2012-06-26 LAB — HM MAMMOGRAPHY

## 2012-06-29 ENCOUNTER — Encounter: Payer: Self-pay | Admitting: Internal Medicine

## 2012-06-29 ENCOUNTER — Ambulatory Visit (INDEPENDENT_AMBULATORY_CARE_PROVIDER_SITE_OTHER): Payer: 59 | Admitting: Internal Medicine

## 2012-06-29 VITALS — BP 118/70 | HR 68 | Ht 64.25 in | Wt 145.0 lb

## 2012-06-29 DIAGNOSIS — K5900 Constipation, unspecified: Secondary | ICD-10-CM

## 2012-06-29 DIAGNOSIS — R195 Other fecal abnormalities: Secondary | ICD-10-CM

## 2012-06-29 MED ORDER — PEG-KCL-NACL-NASULF-NA ASC-C 100 G PO SOLR: 1.0000 | Freq: Once | ORAL | Status: DC

## 2012-06-29 NOTE — Progress Notes (Signed)
Sheila Livingston Mar 26, 1945 MRN [id number]        History of Present Illness:  This is a 67 year old white female with a 1 out of 3 cards positive for blood on home stool test by Dr Chevis Pretty. She reports that she was also positive last year. She had a colonoscopy in January 2005 by Dr. Evette Cristal and was told to have a polyp. Her sister has history of colon polyps. Patient has a history of endometriosis status post TAH and BSO. Rectocele which causes difficult evacuation. An anal fissure which has been intermittently symptomatic and which she treats with the topical steroids. Currently her fissure is in remission but occasionally opens up and she sees blood in her stool. Her recent hemoglobin was 13.   Past Medical History  Diagnosis Date  . Hypertension   . Hyperplastic colon polyp   . Allergy   . Endometriosis   . IBS (irritable bowel syndrome)   . History of skin cancer   . Anal fissure   . Hemorrhoids   . Hyperlipidemia   . Benign paroxysmal positional vertigo    Past Surgical History  Procedure Laterality Date  . Abdominal hysterectomy      bso enometriosis; no cancer  . Bunionectomy Left 11/23/2008  . Rotator cuff repair Right 10/2009  . Thumb nail surgery Right     reports that she has quit smoking. She does not have any smokeless tobacco history on file. She reports that  drinks alcohol. She reports that she does not use illicit drugs. family history includes Breast cancer in her sister; Colon polyps in her sisters; Diabetes in her mother and sister; and Stroke in her mother.  There is no history of Colon cancer. Allergies  Allergen Reactions  . Flonase (Fluticasone Propionate)   . Fluticasone Propionate     REACTION: Facial swelling under eyes nose bleeds  . Imodium (Loperamide)   . Lisinopril     REACTION: dizziness and light headed  . Loperamide Hcl     REACTION: passed out        Review of Systems: Denies abdominal pain chest pain shortness of breath  The remainder  of the 10 point ROS is negative except as outlined in H&P   Physical Exam: General appearance  Well developed, in no distress. Eyes- non icteric. HEENT nontraumatic, normocephalic. Mouth no lesions, tongue papillated, no cheilosis. Neck supple without adenopathy, thyroid not enlarged, no carotid bruits, no JVD. Lungs Clear to auscultation bilaterally. Cor normal S1, normal S2, regular rhythm, no murmur,  quiet precordium. Abdomen: Soft nontender abdomen with normal active bowel sounds. No fullness. Liver edge at costal margin Rectal: And anoscopic exam reveals soft skin tag externally at the rectovaginal Antolin which represents a  healed anal fissure with heaped up edges. It extends into the anal canal. Rectal sphincter is normal. There is no active fissure. Rectal ampulla appears normal. Stool is Hemoccult negative Extremities no pedal edema. Skin no lesions. Neurological alert and oriented x 3. Psychological normal mood and affect.  Assessment and Plan:  67 year old white female with intermittently positive Hemoccult likely attributed to anal fissure. She is almost due for colonoscopy within next 6 months and I suggested that she undergo a full colonoscopy at this time to rule out recurrent polyps or neoplasm. I was unable to reproduce her Hemoccult-positive stool today  History of colon polyps. Family history of colon polyps in her sister as well. She will of have a colonoscopy for screening  History of  anal  fissure. She uses topical steroids to heal the fissure which is currently in remission.  Rectocele. Mildly symptomatic. She uses manual pressure to evacuate. I suggested magnesium oxide FiberCon and high-fiber diet as well as exercise History endometriosis. Status post TAH . BSO    06/29/2012 [name]

## 2012-06-29 NOTE — Patient Instructions (Addendum)
Dr Chevis Pretty, Dr Fabian Sharp  You have been scheduled for a colonoscopy with propofol. Please follow written instructions given to you at your visit today.  Please pick up your prep kit at the pharmacy within the next 1-3 days. If you use inhalers (even only as needed), please bring them with you on the day of your procedure. Your physician has requested that you go to www.startemmi.com and enter the access code given to you at your visit today. This web site gives a general overview about your procedure. However, you should still follow specific instructions given to you by our office regarding your preparation for the procedure.  Use Magnesium Oxide as needed for constipation

## 2012-07-13 ENCOUNTER — Encounter: Payer: 59 | Admitting: Internal Medicine

## 2012-07-21 ENCOUNTER — Telehealth: Payer: Self-pay | Admitting: Internal Medicine

## 2012-07-21 MED ORDER — VALACYCLOVIR HCL 1 G PO TABS: 2000.0000 mg | ORAL_TABLET | Freq: Two times a day (BID) | ORAL | Status: DC

## 2012-07-21 NOTE — Telephone Encounter (Signed)
Patient notified rx sent to the pharmacy

## 2012-07-21 NOTE — Telephone Encounter (Signed)
Ok to use oral valtrex  Will send in to pharmacy .

## 2012-07-21 NOTE — Telephone Encounter (Signed)
Cherree/Patient Phone [phone] called to request Rx for Valtrex for cold sores on left upper and lower lip.  Onset: 07/19/12 and 07/20/12.  Afebrile.  Declined triage due to history of recurrent cold sores.  Was told by friends that Valtrex or generic will speed the healing.   Tried Abbrevia and L-Lysine which help shorten duration.  Asking for MD recommendation and if should use all the cold sore remedies at the same time.  Pharmacy [facility].  Please call back.

## 2012-08-08 ENCOUNTER — Telehealth: Payer: Self-pay | Admitting: Internal Medicine

## 2012-08-08 NOTE — Telephone Encounter (Signed)
Spoke with patient and she was worried because she has eaten some of the foods she was to avoid on the sheet. She will follow the instructions now.

## 2012-08-10 ENCOUNTER — Ambulatory Visit (AMBULATORY_SURGERY_CENTER): Payer: 59 | Admitting: Internal Medicine

## 2012-08-10 ENCOUNTER — Encounter: Payer: Self-pay | Admitting: Internal Medicine

## 2012-08-10 VITALS — BP 135/57 | HR 60 | Temp 96.5°F | Resp 21 | Ht 64.0 in | Wt 145.0 lb

## 2012-08-10 DIAGNOSIS — Z86010 Personal history of colonic polyps: Secondary | ICD-10-CM

## 2012-08-10 DIAGNOSIS — D126 Benign neoplasm of colon, unspecified: Secondary | ICD-10-CM

## 2012-08-10 DIAGNOSIS — Z1211 Encounter for screening for malignant neoplasm of colon: Secondary | ICD-10-CM

## 2012-08-10 MED ORDER — SODIUM CHLORIDE 0.9 % IV SOLN: 500.0000 mL | INTRAVENOUS | Status: DC

## 2012-08-10 NOTE — Progress Notes (Signed)
Patient did not experience any of the following events: a burn prior to discharge; a fall within the facility; wrong site/side/patient/procedure/implant event; or a hospital transfer or hospital admission upon discharge from the facility. ([id number])  

## 2012-08-10 NOTE — Patient Instructions (Addendum)

## 2012-08-10 NOTE — Progress Notes (Signed)
Called to room to assist during endoscopic procedure.  Patient ID and intended procedure confirmed with present staff. Received instructions for my participation in the procedure from the performing physician.  

## 2012-08-10 NOTE — Op Note (Signed)
[facility] [address] [place], [address]   COLONOSCOPY PROCEDURE REPORT  PATIENT: Sheila Livingston, Sheila Livingston  MR#: [id number] BIRTHDATE: 05-15-1945 , 67  yrs. old GENDER: Female ENDOSCOPIST: Hart Carwin, MD REFERRED BY:  [name], [name] , Dr [name] PROCEDURE DATE:  08/10/2012 PROCEDURE:   Colonoscopy with cold biopsy polypectomy  ,last colonoscopy in 2005,  had heme positive stool on home test ASA CLASS:   Class II INDICATIONS:Average risk patient for colon cancer. MEDICATIONS: MAC sedation, administered by CRNA and propofol (Diprivan) 200mg  IV  DESCRIPTION OF PROCEDURE:   After the risks and benefits and of the procedure were explained, informed consent was obtained.  A digital rectal exam revealed no abnormalities of the rectum.    The LB [id number]  endoscope was introduced through the anus and advanced to the cecum, which was identified by both the appendix and ileocecal valve .  The quality of the prep was excellent, using MoviPrep .  The instrument was then slowly withdrawn as the colon was fully examined.     COLON FINDINGS: A smooth sessile polyp ranging between 3-5mm in size was found in the sigmoid colon.  A polypectomy was performed with cold forceps.  The resection was complete and the polyp tissue was completely retrieved.     Retroflexed views revealed no abnormalities.  There were small internal hemorrhoids.   The scope was then withdrawn from the patient and the procedure completed.  COMPLICATIONS: There were no complications. ENDOSCOPIC IMPRESSION: Sessile polyp ranging between 3-55mm in size was found in the sigmoid colon; polypectomy was performed with cold forceps nothing to account for heme positive stool small internal hemorrhoids  RECOMMENDATIONS: 1.  Await pathology results 2.  High fiber diet   REPEAT EXAM: .  10years recall colonoscopy  cc:  _______________________________ eSignedHart Carwin, MD 08/10/2012  11:17 AM     PATIENT NAME:  [name], [name] MR#: [id number]

## 2012-08-10 NOTE — Progress Notes (Signed)
Tol procedure well. VSS, No concerns or complaints voiced 

## 2012-08-11 ENCOUNTER — Telehealth: Payer: Self-pay | Admitting: *Deleted

## 2012-08-11 NOTE — Telephone Encounter (Signed)
  Follow up Call-  Call back number 08/10/2012  Post procedure Call Back phone  # cell [phone]  Permission to leave phone message Yes     Patient questions:  Do you have a fever, pain , or abdominal swelling? no Pain Score  0 *  Have you tolerated food without any problems? yes  Have you been able to return to your normal activities? yes  Do you have any questions about your discharge instructions: Diet   no Medications  no Follow up visit  no  Do you have questions or concerns about your Care? no  Actions: * If pain score is 4 or above: No action needed, pain <4.

## 2012-08-15 ENCOUNTER — Encounter: Payer: Self-pay | Admitting: Internal Medicine

## 2012-08-16 ENCOUNTER — Encounter: Payer: Self-pay | Admitting: Internal Medicine

## 2012-10-19 ENCOUNTER — Other Ambulatory Visit: Payer: Self-pay | Admitting: Internal Medicine

## 2012-12-31 ENCOUNTER — Other Ambulatory Visit: Payer: Self-pay | Admitting: Internal Medicine

## 2013-01-11 ENCOUNTER — Other Ambulatory Visit (INDEPENDENT_AMBULATORY_CARE_PROVIDER_SITE_OTHER): Payer: 59

## 2013-01-11 DIAGNOSIS — Z0000 Encounter for general adult medical examination without abnormal findings: Secondary | ICD-10-CM

## 2013-01-11 LAB — BASIC METABOLIC PANEL
GFR: 74.38 mL/min (ref >60.00)
Potassium: 4.5 mEq/L (ref 3.5–5.1)
Sodium: 141 mEq/L (ref 135–145)

## 2013-01-11 LAB — LIPID PANEL
Cholesterol: 230 mg/dL — ABNORMAL HIGH (ref 0–200)
HDL: 75 mg/dL (ref >39.00)
Triglycerides: 87 mg/dL (ref 0.0–149.0)
VLDL: 17.4 mg/dL (ref 0.0–40.0)

## 2013-01-11 LAB — CBC WITH DIFFERENTIAL/PLATELET
Eosinophils Absolute: 0.2 10*3/uL (ref 0.0–0.7)
Lymphocytes Relative: 37.5 % (ref 12.0–46.0)
MCHC: 34 g/dL (ref 30.0–36.0)
MCV: 91.7 fl (ref 78.0–100.0)
Monocytes Absolute: 0.5 10*3/uL (ref 0.1–1.0)
Neutrophils Relative %: 49.1 % (ref 43.0–77.0)
Platelets: 224 10*3/uL (ref 150.0–400.0)
WBC: 5.4 10*3/uL (ref 4.5–10.5)

## 2013-01-11 LAB — LDL CHOLESTEROL, DIRECT: Direct LDL: 144.5 mg/dL

## 2013-01-11 LAB — HEPATIC FUNCTION PANEL
ALT: 20 U/L (ref 0–35)
Albumin: 4.2 g/dL (ref 3.5–5.2)
Total Protein: 6.9 g/dL (ref 6.0–8.3)

## 2013-01-25 ENCOUNTER — Encounter: Payer: Self-pay | Admitting: Internal Medicine

## 2013-01-25 ENCOUNTER — Ambulatory Visit (INDEPENDENT_AMBULATORY_CARE_PROVIDER_SITE_OTHER): Payer: 59 | Admitting: Internal Medicine

## 2013-01-25 VITALS — BP 122/70 | HR 62 | Temp 98.0°F | Ht 64.75 in | Wt 149.0 lb

## 2013-01-25 DIAGNOSIS — Z23 Encounter for immunization: Secondary | ICD-10-CM

## 2013-01-25 DIAGNOSIS — I10 Essential (primary) hypertension: Secondary | ICD-10-CM

## 2013-01-25 DIAGNOSIS — L309 Dermatitis, unspecified: Secondary | ICD-10-CM

## 2013-01-25 DIAGNOSIS — E785 Hyperlipidemia, unspecified: Secondary | ICD-10-CM

## 2013-01-25 DIAGNOSIS — L259 Unspecified contact dermatitis, unspecified cause: Secondary | ICD-10-CM

## 2013-01-25 DIAGNOSIS — Z0000 Encounter for general adult medical examination without abnormal findings: Secondary | ICD-10-CM

## 2013-01-25 MED ORDER — LOSARTAN POTASSIUM 50 MG PO TABS: ORAL_TABLET | ORAL | Status: DC

## 2013-01-25 NOTE — Progress Notes (Signed)
Chief Complaint  Patient presents with  . Annual Exam  . Hypertension    HPI: Patient comes in today for Preventivwellness visit . No major injuries, ed visits ,hospitalizations , new medications since last visit.HH of 1  Pet cat.  Retired   This week. We'll go on Medicare next month. BP  ;last checked 120 or so . Taking losartan.  Hot feet at times as Morton's neuroma has had injections in the past but no neuropathy symptoms or weakness. Sees dermatology for yearly check nothing unusual also GYN. Had mammogram in June and Dr. Lajoyce Corners office Did notice an itchy patch at the back of her scalp. Uncertain what to put on it forgot to show to Dr. Danella Deis   Health Maintenance  Topic Date Due  . Mammogram  07/15/2012  . Influenza Vaccine  08/26/2013  . Tetanus/tdap  01/27/2015  . Colonoscopy  08/11/2022  . Pneumococcal Polysaccharide Vaccine Age 81 And Over  Completed  . Zostavax  Completed   Health Maintenance Review   Hearing:  Ok   Vision:  No limitations at present . Last eye check UTD  Safety:  Has smoke detector and wears seat belts.  No firearms. No excess sun exposure. Sees dentist regularly.  Falls: no  Advance directive :  Reviewed    Memory: Felt to be good  , no concern from her or her family.  Depression: No anhedonia unusual crying or depressive symptoms  Nutrition: Eats well balanced diet; adequate calcium and vitamin D. No swallowing chewing problems.  Injury: no major injuries in the last six months.  Other healthcare providers:  Reviewed today .  Social:  Lives alone  Pet cats    Preventive parameters: up-to-date  Reviewed   ADLS:   There are no problems or need for assistance  driving, feeding, obtaining food, dressing, toileting and bathing, managing money using phone. She is independent.  EXERCISE/ HABITS  To join Y Per week   No tobacco    etoh rare , caffiene  No sugar.    ROS:  GEN/ HEENT: No fever, significant weight changes sweats headaches  vision problems hearing changes, CV/ PULM; No chest pain shortness of breath cough, syncope,edema  change in exercise tolerance. GI /GU: No adominal pain, vomiting, change in bowel habits. No blood in the stool. No significant GU symptoms. SKIN/HEME: ,no acute skin rashes suspicious lesions or bleeding. No lymphadenopathy, nodules, masses.  NEURO/ PSYCH:  No neurologic signs such as weakness numbness. No depression anxiety. IMM/ Allergy: No unusual infections.  Allergy .   REST of 12 system review negative except as per HPI   Past Medical History  Diagnosis Date  . Hypertension   . Hyperplastic colon polyp   . Allergy   . Endometriosis   . IBS (irritable bowel syndrome)   . History of skin cancer   . Anal fissure   . Hemorrhoids   . Hyperlipidemia   . Benign paroxysmal positional vertigo     Family History  Problem Relation Age of Onset  . Diabetes Mother   . Breast cancer Sister   . Colon polyps Sister   . Stroke Mother   . Diabetes Sister   . Colon cancer Neg Hx   . Colon polyps Sister     History   Social History  . Marital Status: Widowed    Spouse Name: N/A    Number of Children: N/A  . Years of Education: N/A   Social History Main Topics  . Smoking  status: Former Games developer  . Smokeless tobacco: None  . Alcohol Use: Yes     Comment: rare  . Drug Use: No  . Sexual Activity: None   Other Topics Concern  . None   Social History Narrative   Widowed   Dating   HH of 1    Dog and Cat   6-7 hours of sleep   Mom passed away 12/13/09  Working [employer] jeans  38 - 43  retired December 2014   Exercising    Neg tad   ocass etoh.    Outpatient Encounter Prescriptions as of 01/25/2013  Medication Sig  . aspirin 81 MG chewable tablet Chew 81 mg by mouth daily.    . Cholecalciferol (VITAMIN D3) 400 UNITS CAPS Take 1 capsule by mouth daily.  . clobetasol ointment (TEMOVATE) 0.05 % Apply topically 2 (two) times daily.    Marland Kitchen desonide (DESOWEN) 0.05 % ointment Apply  1 application topically 2 (two) times daily.    . fish oil-omega-3 fatty acids 1000 MG capsule Take 2 g by mouth daily.    . Flaxseed, Linseed, (FLAX SEED OIL PO) Take by mouth.    . hydrocortisone 2.5 % cream Apply topically 2 (two) times daily.    Marland Kitchen ibuprofen (ADVIL,MOTRIN) 200 MG tablet Take 200 mg by mouth every 6 (six) hours as needed.    Marland Kitchen losartan (COZAAR) 50 MG tablet TAKE 1 TABLET DAILY  . MULTIPLE VITAMIN PO Take by mouth.    . pramoxine-hydrocortisone (ANALPRAM HC) cream Apply topically 3 (three) times daily.  . valACYclovir (VALTREX) 1000 MG tablet Take 2 tablets (2,000 mg total) by mouth 2 (two) times daily. As needed for cold sores  . Wheat Dextrin (BENEFIBER PO) Take by mouth.    . [DISCONTINUED] losartan (COZAAR) 50 MG tablet TAKE 1 TABLET DAILY  . mometasone (NASONEX) 50 MCG/ACT nasal spray 2 sprays each nostril as needed  . pseudoephedrine (SUDAFED) 30 MG tablet Take 30 mg by mouth every 4 (four) hours as needed.      EXAM:  BP 122/70  Pulse 62  Temp(Src) 98 F (36.7 C) (Oral)  Ht 5' 4.75" (1.645 m)  Wt 149 lb (67.586 kg)  BMI 24.98 kg/m2  SpO2 98%  Body mass index is 24.98 kg/(m^2).  Physical Exam: Vital signs reviewed [id number]:[id number] is a well-developed well-nourished alert cooperative   who appears stated age in no acute distress.  HEENT: normocephalic atraumatic , Eyes: PERRL EOM's full, conjunctiva clear, Nares: paten,t no deformity discharge or tenderness., Ears: no deformity EAC's clear TMs with normal landmarks. Mouth: clear OP, no lesions, edema.  Moist mucous membranes. Dentition in adequate repair. NECK: supple without masses, thyromegaly or bruits. CHEST/PULM:  Clear to auscultation and percussion breath sounds equal no wheeze , rales or rhonchi. No chest Roca deformities or tenderness. Breast: normal by inspection . No dimpling, discharge, masses, tenderness or discharge . CV: PMI is nondisplaced, S1 S2 no gallops, murmurs, rubs. Peripheral pulses are full  without delay.No JVD .  ABDOMEN: Bowel sounds normal nontender  No guard or rebound, no hepato splenomegal no CVA tenderness.  No hernia. Extremtities:  No clubbing cyanosis or edema, no acute joint swelling or redness no focal atrophy left foot bunion surgery scar pulses normal no numbness noted nails manicured NEURO:  Oriented x3, cranial nerves 3-12 appear to be intact, no obvious focal weakness,gait within normal limits no abnormal reflexes or asymmetrical SKIN: No acute rashes normal turgor, color, no bruising or petechiae. Occipital  area shows about a 2 cm red and flaky excoriated patch almost silver scale plaque no weeping or vesicles no other lesions. PSYCH: Oriented, good eye contact, no obvious depression anxiety, cognition and judgment appear normal. LN: no cervical axillary inguinal adenopathy No noted deficits in memory, attention, and speech.   Lab Results  Component Value Date   WBC 5.4 01/11/2013   HGB 12.8 01/11/2013   HCT 37.7 01/11/2013   PLT 224.0 01/11/2013   GLUCOSE 92 01/11/2013   CHOL 230* 01/11/2013   TRIG 87.0 01/11/2013   HDL 75.00 01/11/2013   LDLDIRECT 144.5 01/11/2013   LDLCALC 87 05/04/2007   ALT 20 01/11/2013   AST 20 01/11/2013   NA 141 01/11/2013   K 4.5 01/11/2013   CL 107 01/11/2013   CREATININE 0.8 01/11/2013   BUN 13 01/11/2013   CO2 28 01/11/2013   TSH 1.10 01/11/2013    ASSESSMENT AND PLAN:  Discussed the following assessment and plan:  Visit for preventive health examination - Prevnar 13 today up-to-date  HYPERTENSION - Monitor occasionally trolled on repeat  HYPERLIPIDEMIA - Could be better reinstitute exercise lifestyle changes.  Need for vaccination with 13-polyvalent pneumococcal conjugate vaccine - Plan: Pneumococcal conjugate vaccine 13-valent  Dermatitis - Scalp almost looks psoriatic could be eczematous try her topicals in this area for a couple weeks if not helpful followup with Dr. Danella Deis here  Patient Care Team: [name], MD as PCP - General Janifer Adie, MD (Obstetrics and Gynecology) Eugenia Mcalpine, MD (Orthopedic Surgery) [facility] Bjorn Loser, MD as Attending Physician (Dermatology) Hart Carwin, MD (Gastroenterology)  Patient Instructions  150 minutes of exercise weeks  ,  weight   healthy levels. Avoid trans fats and processed foods;  Increase fresh fruits and veges to 5 servings per day. And avoid sweet beverages  Including tea and juice.    Wellness in 1 year. Or next fall Medicare    Wanda K. Panosh [name]  Pre visit review using our clinic review tool, if applicable. No additional management support is needed unless otherwise documented below in the visit note.

## 2013-01-25 NOTE — Patient Instructions (Addendum)
150 minutes of exercise weeks  ,  weight   healthy levels. Avoid trans fats and processed foods;  Increase fresh fruits and veges to 5 servings per day. And avoid sweet beverages  Including tea and juice.    Wellness in 1 year. Or next fall Medicare

## 2013-03-31 ENCOUNTER — Other Ambulatory Visit: Payer: Self-pay | Admitting: Internal Medicine

## 2013-04-24 DIAGNOSIS — M899 Disorder of bone, unspecified: Secondary | ICD-10-CM | POA: Diagnosis not present

## 2013-04-24 DIAGNOSIS — N959 Unspecified menopausal and perimenopausal disorder: Secondary | ICD-10-CM | POA: Diagnosis not present

## 2013-04-24 DIAGNOSIS — Z124 Encounter for screening for malignant neoplasm of cervix: Secondary | ICD-10-CM | POA: Diagnosis not present

## 2013-04-24 DIAGNOSIS — M949 Disorder of cartilage, unspecified: Secondary | ICD-10-CM | POA: Diagnosis not present

## 2013-04-24 DIAGNOSIS — Z1212 Encounter for screening for malignant neoplasm of rectum: Secondary | ICD-10-CM | POA: Diagnosis not present

## 2013-05-24 DIAGNOSIS — Z1231 Encounter for screening mammogram for malignant neoplasm of breast: Secondary | ICD-10-CM | POA: Diagnosis not present

## 2013-07-11 DIAGNOSIS — H43829 Vitreomacular adhesion, unspecified eye: Secondary | ICD-10-CM | POA: Diagnosis not present

## 2013-10-25 DIAGNOSIS — Z23 Encounter for immunization: Secondary | ICD-10-CM | POA: Diagnosis not present

## 2013-10-27 ENCOUNTER — Encounter: Payer: Self-pay | Admitting: Internal Medicine

## 2013-10-27 ENCOUNTER — Ambulatory Visit (INDEPENDENT_AMBULATORY_CARE_PROVIDER_SITE_OTHER): Payer: Medicare Other | Admitting: Internal Medicine

## 2013-10-27 VITALS — BP 132/70 | Temp 98.2°F | Ht 64.0 in | Wt 142.9 lb

## 2013-10-27 DIAGNOSIS — Z0000 Encounter for general adult medical examination without abnormal findings: Secondary | ICD-10-CM

## 2013-10-27 DIAGNOSIS — I10 Essential (primary) hypertension: Secondary | ICD-10-CM

## 2013-10-27 DIAGNOSIS — E785 Hyperlipidemia, unspecified: Secondary | ICD-10-CM | POA: Insufficient documentation

## 2013-10-27 DIAGNOSIS — Z79899 Other long term (current) drug therapy: Secondary | ICD-10-CM | POA: Insufficient documentation

## 2013-10-27 LAB — HEPATIC FUNCTION PANEL
ALT: 25 U/L (ref 0–35)
AST: 24 U/L (ref 0–37)
Albumin: 4.3 g/dL (ref 3.5–5.2)
Alkaline Phosphatase: 72 U/L (ref 39–117)
BILIRUBIN DIRECT: 0.1 mg/dL (ref 0.0–0.3)
TOTAL PROTEIN: 7.7 g/dL (ref 6.0–8.3)
Total Bilirubin: 0.8 mg/dL (ref 0.2–1.2)

## 2013-10-27 LAB — CBC WITH DIFFERENTIAL/PLATELET
Basophils Absolute: 0 10*3/uL (ref 0.0–0.1)
Basophils Relative: 0.2 % (ref 0.0–3.0)
EOS ABS: 0.1 10*3/uL (ref 0.0–0.7)
Eosinophils Relative: 2.5 % (ref 0.0–5.0)
HEMATOCRIT: 39.9 % (ref 36.0–46.0)
Hemoglobin: 13.6 g/dL (ref 12.0–15.0)
Lymphocytes Relative: 33.2 % (ref 12.0–46.0)
Lymphs Abs: 2 10*3/uL (ref 0.7–4.0)
MCHC: 34.1 g/dL (ref 30.0–36.0)
MCV: 93.2 fl (ref 78.0–100.0)
Monocytes Absolute: 0.5 10*3/uL (ref 0.1–1.0)
Monocytes Relative: 8 % (ref 3.0–12.0)
NEUTROS PCT: 56.1 % (ref 43.0–77.0)
Neutro Abs: 3.3 10*3/uL (ref 1.4–7.7)
Platelets: 215 10*3/uL (ref 150.0–400.0)
RBC: 4.28 Mil/uL (ref 3.87–5.11)
RDW: 13.7 % (ref 11.5–15.5)
WBC: 5.9 10*3/uL (ref 4.0–10.5)

## 2013-10-27 LAB — BASIC METABOLIC PANEL
BUN: 15 mg/dL (ref 6–23)
CALCIUM: 9.9 mg/dL (ref 8.4–10.5)
CHLORIDE: 105 meq/L (ref 96–112)
CO2: 26 mEq/L (ref 19–32)
Creatinine, Ser: 0.8 mg/dL (ref 0.4–1.2)
GFR: 74.21 mL/min (ref >60.00)
Glucose, Bld: 93 mg/dL (ref 70–99)
Potassium: 5.2 mEq/L — ABNORMAL HIGH (ref 3.5–5.1)
SODIUM: 140 meq/L (ref 135–145)

## 2013-10-27 LAB — LIPID PANEL
CHOL/HDL RATIO: 3
CHOLESTEROL: 253 mg/dL — AB (ref 0–200)
HDL: 81.2 mg/dL (ref >39.00)
LDL CALC: 145 mg/dL — AB (ref 0–99)
NonHDL: 171.8
TRIGLYCERIDES: 136 mg/dL (ref 0.0–149.0)
VLDL: 27.2 mg/dL (ref 0.0–40.0)

## 2013-10-27 LAB — TSH: TSH: 1.4 u[IU]/mL (ref 0.35–4.50)

## 2013-10-27 NOTE — Progress Notes (Signed)
Pre visit review using our clinic review tool, if applicable. No additional management support is needed unless otherwise documented below in the visit note.   Chief Complaint  Patient presents with  . Medicare Wellness    HPI: Patient comes in today for Welcome to medicare Preventive Medicare wellness visit . Has change to reg medicare after retiring 12 14 . No major injuries, ed visits ,hospitalizations , new medications since last visit.but did have shoulder surgery and rehab  After a fall  Trip at work  Retired  But working some   20 or less  still having hot flushes .  Engaged to mbe married  .  holding off because of excess tax costs.  Sees gyne   Health Maintenance  Topic Date Due  . Mammogram  07/15/2012  . Influenza Vaccine  08/27/2014  . Tetanus/tdap  01/27/2015  . Colonoscopy  08/11/2022  . Pneumococcal Polysaccharide Vaccine Age 55 And Over  Completed  . Zostavax  Completed   Health Maintenance Review LIFESTYLE:  Exercise:   Not recently not as active  Tobacco/ETS: no  Alcohol:  Sporadically   Sugar beverages: sweet tea ocass  Sleep:  6-8  Drug use: no Bone density:  At dr Carren Rang Colonoscopy: utd  mammo    2015  Dr Conrad Innsbrook office    Hearing:  Some decrease .  Vision:  No limitations at present . Last eye check UTD ok  Contacts   Safety:  Has smoke detector and wears seat belts.  No firearms. No excess sun exposure. Sees dentist regularly.  Falls: no  Advance directive :  Reviewed  HO form given   Memory: Felt to be good  , no concern from her or her family.  Depression: No anhedonia unusual crying or depressive symptoms  Nutrition: Eats well balanced diet; adequate calcium and vitamin D. No swallowing chewing problems.  Injury: no major injuries in the last six months.  Other healthcare providers:  Reviewed today .  Social:  . hh of 2  No pets.   Preventive parameters: up-to-date  Reviewed   ADLS:   There are no problems or need for assistance   driving, feeding, obtaining food, dressing, toileting and bathing, managing money using phone. She is independent.  EXERCISE/ HABITS  Per week   No tobacco    etoh   ROS:  GEN/ HEENT: No fever, significant weight changes sweats headaches vision problems some dec hearing changes, CV/ PULM; No chest pain shortness of breath cough, syncope,edema  change in exercise tolerance. GI /GU: No adominal pain, vomiting, change in bowel habits. No blood in the stool. No significant GU symptoms. SKIN/HEME: ,no acute skin rashes suspicious lesions or bleeding. No lymphadenopathy, nodules, masses.  NEURO/ PSYCH:  No neurologic signs such as weakness numbness. No depression anxiety. IMM/ Allergy: No unusual infections.  Allergy .   REST of 12 system review negative except as per HPI   Past Medical History  Diagnosis Date  . Hypertension   . Hyperplastic colon polyp   . Allergy   . Endometriosis   . IBS (irritable bowel syndrome)   . History of skin cancer   . Anal fissure   . Hemorrhoids   . Hyperlipidemia   . Benign paroxysmal positional vertigo     Family History  Problem Relation Age of Onset  . Diabetes Mother   . Breast cancer Sister   . Colon polyps Sister   . Stroke Mother   . Diabetes Sister   .  Colon cancer Neg Hx   . Colon polyps Sister     History   Social History  . Marital Status: Widowed    Spouse Name: N/A    Number of Children: N/A  . Years of Education: N/A   Social History Main Topics  . Smoking status: Former Research scientist (life sciences)  . Smokeless tobacco: None  . Alcohol Use: Yes     Comment: rare  . Drug Use: No  . Sexual Activity: None   Other Topics Concern  . None   Social History Narrative   Widowed   Dating engaged    [facility] of 2   Dog and Cat   6-7 hours of sleep   Mom passed away 2009/12/09   Working [employer] jeans  49 - 71  retired December 2014 ocass work call bac   Exercising    Neg tad   ocass etoh.    Outpatient Encounter Prescriptions as of 10/27/2013   Medication Sig  . aspirin 81 MG chewable tablet Chew 81 mg by mouth daily.    . Cholecalciferol (VITAMIN D3) 400 UNITS CAPS Take 1 capsule by mouth daily.  . clobetasol ointment (TEMOVATE) 0.05 % Apply topically 2 (two) times daily.    Marland Kitchen desonide (DESOWEN) 0.05 % ointment Apply 1 application topically 2 (two) times daily.    . Flaxseed, Linseed, (FLAX SEED OIL PO) Take by mouth.    . hydrocortisone 2.5 % cream Apply topically 2 (two) times daily.    Marland Kitchen ibuprofen (ADVIL,MOTRIN) 200 MG tablet Take 200 mg by mouth every 6 (six) hours as needed.    Marland Kitchen losartan (COZAAR) 50 MG tablet TAKE 1 TABLET DAILY  . losartan (COZAAR) 50 MG tablet TAKE 1 TABLET DAILY  . MULTIPLE VITAMIN PO Take by mouth.    . pramoxine-hydrocortisone (ANALPRAM HC) cream Apply topically 3 (three) times daily.  . pseudoephedrine (SUDAFED) 30 MG tablet Take 30 mg by mouth every 4 (four) hours as needed.    . valACYclovir (VALTREX) 1000 MG tablet Take 2 tablets (2,000 mg total) by mouth 2 (two) times daily. As needed for cold sores  . [DISCONTINUED] fish oil-omega-3 fatty acids 1000 MG capsule Take 2 g by mouth daily.    . [DISCONTINUED] mometasone (NASONEX) 50 MCG/ACT nasal spray 2 sprays each nostril as needed  . [DISCONTINUED] Wheat Dextrin (BENEFIBER PO) Take by mouth.      EXAM:  BP 132/70  Temp(Src) 98.2 F (36.8 C) (Oral)  Ht 5\' 4"  (1.626 m)  Wt 142 lb 14.4 oz (64.819 kg)  BMI 24.52 kg/m2  Body mass index is 24.52 kg/(m^2).  Physical Exam: Vital signs reviewed [id number] is a well-developed well-nourished alert cooperative   who appears stated age in no acute distress.  HEENT: normocephalic atraumatic , Eyes: PERRL EOM's full, conjunctiva clear, Nares: paten,t no deformity discharge or tenderness., Ears: no deformity EAC's clear TMs with normal landmarks. Mouth: clear OP, no lesions, edema.  Moist mucous membranes. Dentition in adequate repair. NECK: supple without masses, thyromegaly or bruits. CHEST/PULM:  Clear  to auscultation and percussion breath sounds equal no wheeze , rales or rhonchi. No chest Berry deformities or tenderness. CV: PMI is nondisplaced, S1 S2 no gallops, murmurs, rubs. Peripheral pulses are full without delay.No JVD .  Breast: normal by inspection . No dimpling, discharge, masses, tenderness or discharge . ABDOMEN: Bowel sounds normal nontender  No guard or rebound, no hepato splenomegal no CVA tenderness.  No hernia. Extremtities:  No clubbing cyanosis or edema, no  acute joint swelling or redness no focal atrophy NEURO:  Oriented x3, cranial nerves 3-12 appear to be intact, no obvious focal weakness,gait within normal limits no abnormal reflexes or asymmetrical SKIN: No acute rashes normal turgor, color, no bruising or petechiae. PSYCH: Oriented, good eye contact, no obvious depression anxiety, cognition and judgment appear normal. LN: no cervical axillary inguinal adenopathy No noted deficits in memory, attention, and speech.   ASSESSMENT AND PLAN:  Discussed the following assessment and plan:  Welcome to Medicare preventive visit - Ho advanced directives counseled - Plan: Basic metabolic panel, Lipid panel, TSH, CBC with Differential, Hepatic function panel  Hyperlipidemia - lab today - Plan: Basic metabolic panel, Lipid panel, TSH, CBC with Differential, Hepatic function panel  Essential hypertension - continue same med - Plan: Basic metabolic panel, Lipid panel, TSH, CBC with Differential, Hepatic function panel Get hearing checked .   Patient Care Team: Burnis Medin, MD as PCP - General Adam Phenix, MD (Obstetrics and Gynecology) Sydnee Cabal, MD (Orthopedic Surgery) [facility] Myrtice Lauth, MD as Attending Physician (Dermatology) Lafayette Dragon, MD (Gastroenterology)  Patient Instructions  .continue helathy life style   Healthy lifestyle includes : At least 150 minutes of exercise weeks  , weight at healthy levels, which is usually   BMI 19-25. Avoid trans fats  and processed foods;  Increase fresh fruits and veges to 5 servings per day. And avoid sweet beverages including tea and juice. Mediterranean diet with olive oil and nuts have been noted to be heart and brain healthy . Bp below 140/90  Avoid tobacco products . Limit  alcohol to  7 per week for women and 14 servings for men.  Get adequate sleep . Wear seat belts . Don't text and drive .  Get annual flu vaccine  As discusede  Advance directive as discussed      Standley Brooking. Panosh [name]   Lab Results  Component Value Date   WBC 5.9 10/27/2013   HGB 13.6 10/27/2013   HCT 39.9 10/27/2013   PLT 215.0 10/27/2013   GLUCOSE 93 10/27/2013   CHOL 253* 10/27/2013   TRIG 136.0 10/27/2013   HDL 81.20 10/27/2013   LDLDIRECT 144.5 01/11/2013   LDLCALC 145* 10/27/2013   ALT 25 10/27/2013   AST 24 10/27/2013   NA 140 10/27/2013   K 5.2* 10/27/2013   CL 105 10/27/2013   CREATININE 0.8 10/27/2013   BUN 15 10/27/2013   CO2 26 10/27/2013   TSH 1.40 10/27/2013

## 2013-10-27 NOTE — Patient Instructions (Signed)
.  continue helathy life style   Healthy lifestyle includes : At least 150 minutes of exercise weeks  , weight at healthy levels, which is usually   BMI 19-25. Avoid trans fats and processed foods;  Increase fresh fruits and veges to 5 servings per day. And avoid sweet beverages including tea and juice. Mediterranean diet with olive oil and nuts have been noted to be heart and brain healthy . Bp below 140/90  Avoid tobacco products . Limit  alcohol to  7 per week for women and 14 servings for men.  Get adequate sleep . Wear seat belts . Don't text and drive .  Get annual flu vaccine  As discusede  Advance directive as discussed

## 2013-10-30 ENCOUNTER — Telehealth: Payer: Self-pay | Admitting: Internal Medicine

## 2013-10-30 NOTE — Telephone Encounter (Signed)
emmi emailed °

## 2013-11-02 ENCOUNTER — Other Ambulatory Visit: Payer: Self-pay | Admitting: Family Medicine

## 2013-11-02 DIAGNOSIS — E875 Hyperkalemia: Secondary | ICD-10-CM

## 2013-11-27 ENCOUNTER — Other Ambulatory Visit (INDEPENDENT_AMBULATORY_CARE_PROVIDER_SITE_OTHER): Payer: Medicare Other

## 2013-11-27 DIAGNOSIS — E875 Hyperkalemia: Secondary | ICD-10-CM | POA: Diagnosis not present

## 2013-11-27 LAB — BASIC METABOLIC PANEL
BUN: 11 mg/dL (ref 6–23)
CHLORIDE: 103 meq/L (ref 96–112)
CO2: 26 mEq/L (ref 19–32)
Calcium: 10.1 mg/dL (ref 8.4–10.5)
Creatinine, Ser: 0.8 mg/dL (ref 0.4–1.2)
GFR: 77.49 mL/min (ref >60.00)
Glucose, Bld: 82 mg/dL (ref 70–99)
Potassium: 3.8 mEq/L (ref 3.5–5.1)
Sodium: 138 mEq/L (ref 135–145)

## 2013-12-06 ENCOUNTER — Encounter: Payer: Self-pay | Admitting: Family Medicine

## 2013-12-13 DIAGNOSIS — H35353 Cystoid macular degeneration, bilateral: Secondary | ICD-10-CM | POA: Diagnosis not present

## 2013-12-13 DIAGNOSIS — H40013 Open angle with borderline findings, low risk, bilateral: Secondary | ICD-10-CM | POA: Diagnosis not present

## 2013-12-28 ENCOUNTER — Other Ambulatory Visit: Payer: Self-pay | Admitting: Dermatology

## 2013-12-28 DIAGNOSIS — L570 Actinic keratosis: Secondary | ICD-10-CM | POA: Diagnosis not present

## 2013-12-28 DIAGNOSIS — D485 Neoplasm of uncertain behavior of skin: Secondary | ICD-10-CM | POA: Diagnosis not present

## 2013-12-28 DIAGNOSIS — Z85828 Personal history of other malignant neoplasm of skin: Secondary | ICD-10-CM | POA: Diagnosis not present

## 2013-12-28 DIAGNOSIS — L900 Lichen sclerosus et atrophicus: Secondary | ICD-10-CM | POA: Diagnosis not present

## 2013-12-28 DIAGNOSIS — D239 Other benign neoplasm of skin, unspecified: Secondary | ICD-10-CM | POA: Diagnosis not present

## 2013-12-28 DIAGNOSIS — L821 Other seborrheic keratosis: Secondary | ICD-10-CM | POA: Diagnosis not present

## 2013-12-28 DIAGNOSIS — C44311 Basal cell carcinoma of skin of nose: Secondary | ICD-10-CM | POA: Diagnosis not present

## 2014-02-02 DIAGNOSIS — C44319 Basal cell carcinoma of skin of other parts of face: Secondary | ICD-10-CM | POA: Diagnosis not present

## 2014-02-02 DIAGNOSIS — D2339 Other benign neoplasm of skin of other parts of face: Secondary | ICD-10-CM | POA: Diagnosis not present

## 2014-02-07 ENCOUNTER — Encounter: Payer: Self-pay | Admitting: Internal Medicine

## 2014-02-21 DIAGNOSIS — M5136 Other intervertebral disc degeneration, lumbar region: Secondary | ICD-10-CM | POA: Diagnosis not present

## 2014-03-05 DIAGNOSIS — M5136 Other intervertebral disc degeneration, lumbar region: Secondary | ICD-10-CM | POA: Diagnosis not present

## 2014-03-13 DIAGNOSIS — M5136 Other intervertebral disc degeneration, lumbar region: Secondary | ICD-10-CM | POA: Diagnosis not present

## 2014-03-15 DIAGNOSIS — M5136 Other intervertebral disc degeneration, lumbar region: Secondary | ICD-10-CM | POA: Diagnosis not present

## 2014-03-20 DIAGNOSIS — M5136 Other intervertebral disc degeneration, lumbar region: Secondary | ICD-10-CM | POA: Diagnosis not present

## 2014-03-23 DIAGNOSIS — M5136 Other intervertebral disc degeneration, lumbar region: Secondary | ICD-10-CM | POA: Diagnosis not present

## 2014-03-24 ENCOUNTER — Other Ambulatory Visit: Payer: Self-pay | Admitting: Internal Medicine

## 2014-03-26 NOTE — Telephone Encounter (Signed)
Sent to the pharmacy by e-scribe. 

## 2014-03-27 DIAGNOSIS — M5136 Other intervertebral disc degeneration, lumbar region: Secondary | ICD-10-CM | POA: Diagnosis not present

## 2014-03-29 DIAGNOSIS — M5136 Other intervertebral disc degeneration, lumbar region: Secondary | ICD-10-CM | POA: Diagnosis not present

## 2014-03-29 DIAGNOSIS — L821 Other seborrheic keratosis: Secondary | ICD-10-CM | POA: Diagnosis not present

## 2014-03-29 DIAGNOSIS — L900 Lichen sclerosus et atrophicus: Secondary | ICD-10-CM | POA: Diagnosis not present

## 2014-03-29 DIAGNOSIS — Z85828 Personal history of other malignant neoplasm of skin: Secondary | ICD-10-CM | POA: Diagnosis not present

## 2014-05-28 ENCOUNTER — Other Ambulatory Visit: Payer: Self-pay | Admitting: Gynecology

## 2014-05-28 DIAGNOSIS — Z124 Encounter for screening for malignant neoplasm of cervix: Secondary | ICD-10-CM | POA: Diagnosis not present

## 2014-05-28 DIAGNOSIS — Z6824 Body mass index (BMI) 24.0-24.9, adult: Secondary | ICD-10-CM | POA: Diagnosis not present

## 2014-05-28 DIAGNOSIS — Z1212 Encounter for screening for malignant neoplasm of rectum: Secondary | ICD-10-CM | POA: Diagnosis not present

## 2014-05-28 DIAGNOSIS — Z1231 Encounter for screening mammogram for malignant neoplasm of breast: Secondary | ICD-10-CM | POA: Diagnosis not present

## 2014-05-30 LAB — CYTOLOGY - PAP

## 2014-06-08 ENCOUNTER — Telehealth: Payer: Self-pay | Admitting: *Deleted

## 2014-06-08 NOTE — Telephone Encounter (Signed)
Left message for pt to call back.  Need to know if pt has had or needs a mammogram this year

## 2014-06-27 DIAGNOSIS — R1310 Dysphagia, unspecified: Secondary | ICD-10-CM | POA: Diagnosis not present

## 2014-06-27 DIAGNOSIS — L820 Inflamed seborrheic keratosis: Secondary | ICD-10-CM | POA: Diagnosis not present

## 2014-06-27 DIAGNOSIS — D485 Neoplasm of uncertain behavior of skin: Secondary | ICD-10-CM | POA: Diagnosis not present

## 2014-07-02 DIAGNOSIS — H40013 Open angle with borderline findings, low risk, bilateral: Secondary | ICD-10-CM | POA: Diagnosis not present

## 2014-07-09 DIAGNOSIS — H16103 Unspecified superficial keratitis, bilateral: Secondary | ICD-10-CM | POA: Diagnosis not present

## 2014-07-16 DIAGNOSIS — H16103 Unspecified superficial keratitis, bilateral: Secondary | ICD-10-CM | POA: Diagnosis not present

## 2014-07-23 ENCOUNTER — Encounter: Payer: Self-pay | Admitting: *Deleted

## 2014-07-26 DIAGNOSIS — R1310 Dysphagia, unspecified: Secondary | ICD-10-CM | POA: Diagnosis not present

## 2014-07-26 DIAGNOSIS — J3089 Other allergic rhinitis: Secondary | ICD-10-CM | POA: Diagnosis not present

## 2014-07-26 DIAGNOSIS — J342 Deviated nasal septum: Secondary | ICD-10-CM | POA: Diagnosis not present

## 2014-08-29 ENCOUNTER — Encounter: Payer: Self-pay | Admitting: Podiatry

## 2014-08-29 ENCOUNTER — Ambulatory Visit (INDEPENDENT_AMBULATORY_CARE_PROVIDER_SITE_OTHER): Payer: Medicare Other | Admitting: Podiatry

## 2014-08-29 VITALS — BP 122/74 | HR 60 | Ht 64.0 in | Wt 135.0 lb

## 2014-08-29 DIAGNOSIS — M7741 Metatarsalgia, right foot: Secondary | ICD-10-CM

## 2014-08-29 DIAGNOSIS — M21969 Unspecified acquired deformity of unspecified lower leg: Secondary | ICD-10-CM | POA: Diagnosis not present

## 2014-08-29 DIAGNOSIS — M2010 Hallux valgus (acquired), unspecified foot: Secondary | ICD-10-CM | POA: Diagnosis not present

## 2014-08-29 DIAGNOSIS — M7740 Metatarsalgia, unspecified foot: Secondary | ICD-10-CM | POA: Diagnosis not present

## 2014-08-29 DIAGNOSIS — M21611 Bunion of right foot: Secondary | ICD-10-CM

## 2014-08-29 NOTE — Patient Instructions (Signed)
Seen for pain in right lateral forefoot. Noted of elevated first Metatarsal bone with lateral weight shifting. Metatarsal binder dispensed x 1. Both feet casted for orthotics. We will call when they are ready.

## 2014-08-29 NOTE — Progress Notes (Signed)
SUBJECTIVE: 69 y.o. year old female presents complaining of pain on right forefoot lateral column under the 5th MPJ duration of several months.  Patient is an avid walker and the pain is hindering her exercise routine.   REVIEW OF SYSTEMS: A comprehensive review of systems was negative.  OBJECTIVE: DERMATOLOGIC EXAMINATION: Nails: Normal findings. No abnormal open lesions noted.  VASCULAR EXAMINATION OF LOWER LIMBS: All pedal pulses are palpable bilateral. NEUROLOGIC EXAMINATION OF THE LOWER LIMBS: All epicritic and tactile sensations grossly intact.  MUSCULOSKELETAL EXAMINATION: Positive of hallux valgus with bunion right.  Elevated first ray right. Forefoot varus right.   RADIOGRAPHIC FINDINGS: Cavus type foot with elevated first ray bilateral R>L.   ASSESSMENT: Forefoot varus with pain at lateral column. Deformed Metatarsal bilateral.  PLAN: Reviewed findings and available treatment options. Metatarsal binder dispensed for right foot. Casted for orthotics.

## 2014-09-12 ENCOUNTER — Encounter: Payer: Self-pay | Admitting: Internal Medicine

## 2014-09-12 MED ORDER — LOSARTAN POTASSIUM 50 MG PO TABS: 50.0000 mg | ORAL_TABLET | Freq: Every day | ORAL | Status: DC

## 2014-09-12 NOTE — Telephone Encounter (Signed)
Sent to the pharmacy by e-scribe. 

## 2014-09-24 DIAGNOSIS — H16103 Unspecified superficial keratitis, bilateral: Secondary | ICD-10-CM | POA: Diagnosis not present

## 2014-10-02 ENCOUNTER — Telehealth: Payer: Self-pay | Admitting: Internal Medicine

## 2014-10-02 DIAGNOSIS — H16103 Unspecified superficial keratitis, bilateral: Secondary | ICD-10-CM | POA: Diagnosis not present

## 2014-10-02 NOTE — Telephone Encounter (Signed)
I am not sure  No specific advice .   Need to stay in network  otherwise    [facility] could still see her  With different provider.

## 2014-10-02 NOTE — Telephone Encounter (Signed)
Pt states she contacted Dr. Nichola Sizer office and was informed she has retired.  Pt wants to know of a gastroenterologist near the [facility] area that Dr. Regis Bill could recommend her to see.

## 2014-10-03 NOTE — Telephone Encounter (Signed)
Pt aware of dr Regis Bill recommendation to stay in network, otherwise no specific advice

## 2014-10-03 NOTE — Telephone Encounter (Signed)
Left a message for a return call.

## 2014-10-10 DIAGNOSIS — H40013 Open angle with borderline findings, low risk, bilateral: Secondary | ICD-10-CM | POA: Diagnosis not present

## 2014-10-10 DIAGNOSIS — H04123 Dry eye syndrome of bilateral lacrimal glands: Secondary | ICD-10-CM | POA: Diagnosis not present

## 2014-10-29 DIAGNOSIS — H01002 Unspecified blepharitis right lower eyelid: Secondary | ICD-10-CM | POA: Diagnosis not present

## 2014-10-29 DIAGNOSIS — H01005 Unspecified blepharitis left lower eyelid: Secondary | ICD-10-CM | POA: Diagnosis not present

## 2014-10-29 DIAGNOSIS — H01004 Unspecified blepharitis left upper eyelid: Secondary | ICD-10-CM | POA: Diagnosis not present

## 2014-10-29 DIAGNOSIS — H01001 Unspecified blepharitis right upper eyelid: Secondary | ICD-10-CM | POA: Diagnosis not present

## 2014-10-29 DIAGNOSIS — H25013 Cortical age-related cataract, bilateral: Secondary | ICD-10-CM | POA: Diagnosis not present

## 2014-10-29 DIAGNOSIS — H40003 Preglaucoma, unspecified, bilateral: Secondary | ICD-10-CM | POA: Diagnosis not present

## 2014-10-29 LAB — HM DIABETES EYE EXAM

## 2014-10-31 ENCOUNTER — Encounter: Payer: Self-pay | Admitting: Family Medicine

## 2014-11-01 ENCOUNTER — Encounter: Payer: Self-pay | Admitting: Podiatry

## 2014-11-01 ENCOUNTER — Ambulatory Visit (INDEPENDENT_AMBULATORY_CARE_PROVIDER_SITE_OTHER): Payer: Medicare Other | Admitting: Podiatry

## 2014-11-01 VITALS — BP 139/82 | HR 57

## 2014-11-01 DIAGNOSIS — M21961 Unspecified acquired deformity of right lower leg: Secondary | ICD-10-CM

## 2014-11-01 NOTE — Patient Instructions (Signed)
Follow up on Orthotics. Aperture pad added to right with 5th MPJ indentation. Try it out. If helps we will add permanent padding. Return as needed.

## 2014-11-01 NOTE — Progress Notes (Signed)
Follow up on orthotics. Sub 5 right foot still sore, but not as much as before.  Aperture pad added to orthotics to reduce pressure under 5th MPJ. Try it out and return for permanent mounting if needed.

## 2014-11-02 ENCOUNTER — Ambulatory Visit: Payer: Medicare Other | Admitting: Podiatry

## 2014-11-09 DIAGNOSIS — Z23 Encounter for immunization: Secondary | ICD-10-CM | POA: Diagnosis not present

## 2014-11-23 ENCOUNTER — Telehealth: Payer: Self-pay | Admitting: Internal Medicine

## 2014-11-23 DIAGNOSIS — H9190 Unspecified hearing loss, unspecified ear: Secondary | ICD-10-CM

## 2014-11-23 NOTE — Telephone Encounter (Signed)
Referral placed.

## 2014-11-23 NOTE — Telephone Encounter (Signed)
Pt call to req a referral to see [facility] in [place]  Reason  pt said she does not hear all that well     [phone].

## 2014-11-23 NOTE — Telephone Encounter (Signed)
Ok   To refer   

## 2014-11-28 DIAGNOSIS — H903 Sensorineural hearing loss, bilateral: Secondary | ICD-10-CM | POA: Diagnosis not present

## 2014-12-12 ENCOUNTER — Ambulatory Visit (INDEPENDENT_AMBULATORY_CARE_PROVIDER_SITE_OTHER): Payer: Medicare Other | Admitting: Internal Medicine

## 2014-12-12 ENCOUNTER — Encounter: Payer: Self-pay | Admitting: Internal Medicine

## 2014-12-12 VITALS — BP 124/70 | Temp 97.9°F | Ht 64.0 in | Wt 132.7 lb

## 2014-12-12 DIAGNOSIS — H903 Sensorineural hearing loss, bilateral: Secondary | ICD-10-CM | POA: Diagnosis not present

## 2014-12-12 DIAGNOSIS — I10 Essential (primary) hypertension: Secondary | ICD-10-CM

## 2014-12-12 DIAGNOSIS — Z0000 Encounter for general adult medical examination without abnormal findings: Secondary | ICD-10-CM | POA: Diagnosis not present

## 2014-12-12 DIAGNOSIS — E785 Hyperlipidemia, unspecified: Secondary | ICD-10-CM

## 2014-12-12 LAB — TSH: TSH: 1.18 u[IU]/mL (ref 0.35–4.50)

## 2014-12-12 LAB — BASIC METABOLIC PANEL
BUN: 14 mg/dL (ref 6–23)
CALCIUM: 9.9 mg/dL (ref 8.4–10.5)
CO2: 28 mEq/L (ref 19–32)
Chloride: 104 mEq/L (ref 96–112)
Creatinine, Ser: 0.79 mg/dL (ref 0.40–1.20)
GFR: 76.13 mL/min (ref >60.00)
Glucose, Bld: 92 mg/dL (ref 70–99)
Potassium: 4.1 mEq/L (ref 3.5–5.1)
SODIUM: 140 meq/L (ref 135–145)

## 2014-12-12 LAB — LIPID PANEL
CHOLESTEROL: 244 mg/dL — AB (ref 0–200)
HDL: 78.4 mg/dL (ref >39.00)
LDL Cholesterol: 146 mg/dL — ABNORMAL HIGH (ref 0–99)
NonHDL: 165.29
TRIGLYCERIDES: 94 mg/dL (ref 0.0–149.0)
Total CHOL/HDL Ratio: 3
VLDL: 18.8 mg/dL (ref 0.0–40.0)

## 2014-12-12 LAB — T4, FREE: Free T4: 0.81 ng/dL (ref 0.60–1.60)

## 2014-12-12 NOTE — Patient Instructions (Addendum)
Continue lifestyle intervention healthy eating and exercise . Will notify you  of labs when available. If all ok then yearly check  Get mammogram yearly for now  If have dense breasts . Can make you own appt.  Continue with seeing dermatology. Can discontinue pap smears  Routine gyne  We can refer if needed or problem arises .   Health Maintenance, Female Adopting a healthy lifestyle and getting preventive care can go a long way to promote health and wellness. Talk with your health care provider about what schedule of regular examinations is right for you. This is a good chance for you to check in with your provider about disease prevention and staying healthy. In between checkups, there are plenty of things you can do on your own. Experts have done a lot of research about which lifestyle changes and preventive measures are most likely to keep you healthy. Ask your health care provider for more information. WEIGHT AND DIET  Eat a healthy diet  Be sure to include plenty of vegetables, fruits, low-fat dairy products, and lean protein.  Do not eat a lot of foods high in solid fats, added sugars, or salt.  Get regular exercise. This is one of the most important things you can do for your health.  Most adults should exercise for at least 150 minutes each week. The exercise should increase your heart rate and make you sweat (moderate-intensity exercise).  Most adults should also do strengthening exercises at least twice a week. This is in addition to the moderate-intensity exercise.  Maintain a healthy weight  Body mass index (BMI) is a measurement that can be used to identify possible weight problems. It estimates body fat based on height and weight. Your health care provider can help determine your BMI and help you achieve or maintain a healthy weight.  For females 32 years of age and older:   A BMI below 18.5 is considered underweight.  A BMI of 18.5 to 24.9 is normal.  A BMI of 25  to 29.9 is considered overweight.  A BMI of 30 and above is considered obese.  Watch levels of cholesterol and blood lipids  You should start having your blood tested for lipids and cholesterol at 71 years of age, then have this test every 5 years.  You may need to have your cholesterol levels checked more often if:  Your lipid or cholesterol levels are high.  You are older than 70 years of age.  You are at high risk for heart disease.  CANCER SCREENING   Lung Cancer  Lung cancer screening is recommended for adults 43-77 years old who are at high risk for lung cancer because of a history of smoking.  A yearly low-dose CT scan of the lungs is recommended for people who:  Currently smoke.  Have quit within the past 15 years.  Have at least a 30-pack-year history of smoking. A pack year is smoking an average of one pack of cigarettes a day for 1 year.  Yearly screening should continue until it has been 15 years since you quit.  Yearly screening should stop if you develop a health problem that would prevent you from having lung cancer treatment.  Breast Cancer  Practice breast self-awareness. This means understanding how your breasts normally appear and feel.  It also means doing regular breast self-exams. Let your health care provider know about any changes, no matter how small.  If you are in your 20s or 30s, you should have  a clinical breast exam (CBE) by a health care provider every 1-3 years as part of a regular health exam.  If you are 105 or older, have a CBE every year. Also consider having a breast X-ray (mammogram) every year.  If you have a family history of breast cancer, talk to your health care provider about genetic screening.  If you are at high risk for breast cancer, talk to your health care provider about having an MRI and a mammogram every year.  Breast cancer gene (BRCA) assessment is recommended for women who have family members with BRCA-related  cancers. BRCA-related cancers include:  Breast.  Ovarian.  Tubal.  Peritoneal cancers.  Results of the assessment will determine the need for genetic counseling and BRCA1 and BRCA2 testing. Cervical Cancer Your health care provider may recommend that you be screened regularly for cancer of the pelvic organs (ovaries, uterus, and vagina). This screening involves a pelvic examination, including checking for microscopic changes to the surface of your cervix (Pap test). You may be encouraged to have this screening done every 3 years, beginning at age 2.  For women ages 19-65, health care providers may recommend pelvic exams and Pap testing every 3 years, or they may recommend the Pap and pelvic exam, combined with testing for human papilloma virus (HPV), every 5 years. Some types of HPV increase your risk of cervical cancer. Testing for HPV may also be done on women of any age with unclear Pap test results.  Other health care providers may not recommend any screening for nonpregnant women who are considered low risk for pelvic cancer and who do not have symptoms. Ask your health care provider if a screening pelvic exam is right for you.  If you have had past treatment for cervical cancer or a condition that could lead to cancer, you need Pap tests and screening for cancer for at least 20 years after your treatment. If Pap tests have been discontinued, your risk factors (such as having a new sexual partner) need to be reassessed to determine if screening should resume. Some women have medical problems that increase the chance of getting cervical cancer. In these cases, your health care provider may recommend more frequent screening and Pap tests. Colorectal Cancer  This type of cancer can be detected and often prevented.  Routine colorectal cancer screening usually begins at 71 years of age and continues through 71 years of age.  Your health care provider may recommend screening at an earlier  age if you have risk factors for colon cancer.  Your health care provider may also recommend using home test kits to check for hidden blood in the stool.  A small camera at the end of a tube can be used to examine your colon directly (sigmoidoscopy or colonoscopy). This is done to check for the earliest forms of colorectal cancer.  Routine screening usually begins at age 77.  Direct examination of the colon should be repeated every 5-10 years through 71 years of age. However, you may need to be screened more often if early forms of precancerous polyps or small growths are found. Skin Cancer  Check your skin from head to toe regularly.  Tell your health care provider about any new moles or changes in moles, especially if there is a change in a mole's shape or color.  Also tell your health care provider if you have a mole that is larger than the size of a pencil eraser.  Always use sunscreen. Apply sunscreen  liberally and repeatedly throughout the day.  Protect yourself by wearing long sleeves, pants, a wide-brimmed hat, and sunglasses whenever you are outside. HEART DISEASE, DIABETES, AND HIGH BLOOD PRESSURE   High blood pressure causes heart disease and increases the risk of stroke. High blood pressure is more likely to develop in:  People who have blood pressure in the high end of the normal range (130-139/85-89 mm Hg).  People who are overweight or obese.  People who are African American.  If you are 28-66 years of age, have your blood pressure checked every 3-5 years. If you are 53 years of age or older, have your blood pressure checked every year. You should have your blood pressure measured twice--once when you are at a hospital or clinic, and once when you are not at a hospital or clinic. Record the average of the two measurements. To check your blood pressure when you are not at a hospital or clinic, you can use:  An automated blood pressure machine at a pharmacy.  A home  blood pressure monitor.  If you are between 43 years and 27 years old, ask your health care provider if you should take aspirin to prevent strokes.  Have regular diabetes screenings. This involves taking a blood sample to check your fasting blood sugar level.  If you are at a normal weight and have a low risk for diabetes, have this test once every three years after 71 years of age.  If you are overweight and have a high risk for diabetes, consider being tested at a younger age or more often. PREVENTING INFECTION  Hepatitis B  If you have a higher risk for hepatitis B, you should be screened for this virus. You are considered at high risk for hepatitis B if:  You were born in a country where hepatitis B is common. Ask your health care provider which countries are considered high risk.  Your parents were born in a high-risk country, and you have not been immunized against hepatitis B (hepatitis B vaccine).  You have HIV or AIDS.  You use needles to inject street drugs.  You live with someone who has hepatitis B.  You have had sex with someone who has hepatitis B.  You get hemodialysis treatment.  You take certain medicines for conditions, including cancer, organ transplantation, and autoimmune conditions. Hepatitis C  Blood testing is recommended for:  Everyone born from [birth year] through 1967.  Anyone with known risk factors for hepatitis C. Sexually transmitted infections (STIs)  You should be screened for sexually transmitted infections (STIs) including gonorrhea and chlamydia if:  You are sexually active and are younger than 71 years of age.  You are older than 71 years of age and your health care provider tells you that you are at risk for this type of infection.  Your sexual activity has changed since you were last screened and you are at an increased risk for chlamydia or gonorrhea. Ask your health care provider if you are at risk.  If you do not have HIV, but are at  risk, it may be recommended that you take a prescription medicine daily to prevent HIV infection. This is called pre-exposure prophylaxis (PrEP). You are considered at risk if:  You are sexually active and do not regularly use condoms or know the HIV status of your partner(s).  You take drugs by injection.  You are sexually active with a partner who has HIV. Talk with your health care provider about whether you  are at high risk of being infected with HIV. If you choose to begin PrEP, you should first be tested for HIV. You should then be tested every 3 months for as long as you are taking PrEP.  PREGNANCY   If you are premenopausal and you may become pregnant, ask your health care provider about preconception counseling.  If you may become pregnant, take 400 to 800 micrograms (mcg) of folic acid every day.  If you want to prevent pregnancy, talk to your health care provider about birth control (contraception). OSTEOPOROSIS AND MENOPAUSE   Osteoporosis is a disease in which the bones lose minerals and strength with aging. This can result in serious bone fractures. Your risk for osteoporosis can be identified using a bone density scan.  If you are 43 years of age or older, or if you are at risk for osteoporosis and fractures, ask your health care provider if you should be screened.  Ask your health care provider whether you should take a calcium or vitamin D supplement to lower your risk for osteoporosis.  Menopause may have certain physical symptoms and risks.  Hormone replacement therapy may reduce some of these symptoms and risks. Talk to your health care provider about whether hormone replacement therapy is right for you.  HOME CARE INSTRUCTIONS   Schedule regular health, dental, and eye exams.  Stay current with your immunizations.   Do not use any tobacco products including cigarettes, chewing tobacco, or electronic cigarettes.  If you are pregnant, do not drink alcohol.  If  you are breastfeeding, limit how much and how often you drink alcohol.  Limit alcohol intake to no more than 1 drink per day for nonpregnant women. One drink equals 12 ounces of beer, 5 ounces of wine, or 1 ounces of hard liquor.  Do not use street drugs.  Do not share needles.  Ask your health care provider for help if you need support or information about quitting drugs.  Tell your health care provider if you often feel depressed.  Tell your health care provider if you have ever been abused or do not feel safe at home.   This information is not intended to replace advice given to you by your health care provider. Make sure you discuss any questions you have with your health care provider.   Document Released: 07/28/2010 Document Revised: 02/02/2014 Document Reviewed: 12/14/2012 [employer] Interactive Patient Education [employer].

## 2014-12-12 NOTE — Progress Notes (Signed)
Pre visit review using our clinic review tool, if applicable. No additional management support is needed unless otherwise documented below in the visit note.  Chief Complaint  Patient presents with  . Medicare Wellness    HPI: Sheila Livingston 69 y.o. comes in today for Preventive Medicare wellness visit .Since last visit. Had audio evaluation soeom SN hearing loss she chooses at this time to not augment . Cost etc   Nose as stuffy  Right foot MT pain orthodic and inserts do well  Blepharitis   Per eye doc  Managing  Had gotten married  Hx of hxv remote  Would like vatltrex refilled Last gyne check mezer now retired  drm sees for cancer  On nose  Also sees derm for lichen sclerosis  BP controlled no se of med   Health Maintenance  Topic Date Due  . Hepatitis C Screening  04-25-1945  . DEXA SCAN  05/25/2008  . MAMMOGRAM  06/27/2014  . TETANUS/TDAP  01/27/2015  . INFLUENZA VACCINE  08/27/2015  . COLONOSCOPY  08/11/2022  . ZOSTAVAX  Completed  . PNA vac Low Risk Adult  Completed   Health Maintenance Review LIFESTYLE:  TADsocial etoh. Weight watcher  Sugar beverages: Sleep: 7  Low impact aerobic    MEDICARE DOCUMENT QUESTIONS  TO SCAN     Hearing:  Some dec   See audioreport   Vision:  No limitations at present . Last eye check UTD  Safety:  Has smoke detector and wears seat belts.  No firearms. No excess sun exposure. Sees dentist regularly.  Falls: n  Advance directive :  Reviewed  Has one.  Memory: Felt to be good  , no concern from her or her family.  Depression: No anhedonia unusual crying or depressive symptoms  Nutrition: Eats well balanced diet; adequate calcium and vitamin D. No swallowing chewing problems.  Injury: no major injuries in the last six months.  Other healthcare providers:  Reviewed today .  Social:  Lives with spouse married. No pets.   Preventive parameters: up-to-date  Reviewed   ADLS:   There are no problems or need for assistance   driving, feeding, obtaining food, dressing, toileting and bathing, managing money using phone. She is independent.    ROS:   See HPI  GEN/ HEENT: No fever, significant weight changes sweats headaches vision problems hearing changes, CV/ PULM; No chest pain shortness of breath cough, syncope,edema  change in exercise tolerance. GI /GU: No adominal pain, vomiting, change in bowel habits. No blood in the stool. No significant GU symptoms. SKIN/HEME: ,no acute skin rashes suspicious lesions or bleeding. No lymphadenopathy, nodules, masses.  NEURO/ PSYCH:  No neurologic signs such as weakness numbness. No depression anxiety. IMM/ Allergy: No unusual infections.  Allergy .   REST of 12 system review negative except as per HPI   Past Medical History  Diagnosis Date  . Hypertension   . Hyperplastic colon polyp   . Allergy   . Endometriosis   . IBS (irritable bowel syndrome)   . History of skin cancer   . Anal fissure   . Hemorrhoids   . Hyperlipidemia   . Benign paroxysmal positional vertigo     Family History  Problem Relation Age of Onset  . Diabetes Mother   . Breast cancer Sister   . Colon polyps Sister   . Stroke Mother   . Diabetes Sister   . Colon cancer Neg Hx   . Colon polyps Sister  Social History   Social History  . Marital Status: Widowed    Spouse Name: N/A  . Number of Children: N/A  . Years of Education: N/A   Social History Main Topics  . Smoking status: Former Research scientist (life sciences)  . Smokeless tobacco: Never Used  . Alcohol Use: 0.0 oz/week    0 Standard drinks or equivalent per week     Comment: rare  . Drug Use: No  . Sexual Activity: Not Asked   Other Topics Concern  . 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     Outpatient Encounter Prescriptions as of 12/12/2014  Medication Sig  . aspirin 81 MG chewable tablet Chew 81 mg by mouth daily.    . Cholecalciferol (VITAMIN D3) 400 UNITS CAPS Take 1 capsule by mouth daily.  . clobetasol ointment (TEMOVATE) 0.05 % Apply topically 2 (two) times daily.    . Flaxseed, Linseed, (FLAX SEED OIL PO) Take by mouth.    Marland Kitchen ibuprofen (ADVIL,MOTRIN) 200 MG tablet Take 200 mg by mouth every 6 (six) hours as needed.    Marland Kitchen losartan (COZAAR) 50 MG tablet Take 1 tablet (50 mg total) by mouth daily.  . MULTIPLE VITAMIN PO Take by mouth.    . Omega-3 Fatty Acids (FISH OIL PO) Take by mouth.  . pramoxine-hydrocortisone (ANALPRAM HC) cream Apply topically 3 (three) times daily.  . valACYclovir (VALTREX) 1000 MG tablet Take 2 tablets (2,000 mg total) by mouth 2 (two) times daily. As needed for cold sores  . desonide (DESOWEN) 0.05 % ointment Apply 1 application topically 2 (two) times daily.    . hydrocortisone 2.5 % cream Apply topically 2 (two) times daily.    . pseudoephedrine (SUDAFED) 30 MG tablet Take 30 mg by mouth every 4 (four) hours as needed.     No facility-administered encounter medications on file as of 12/12/2014.    EXAM:  BP 124/70 mmHg  Temp(Src) 97.9 F (36.6 C) (Oral)  Ht $R'5\' 4"'QQ$  (1.626 m)  Wt 132 lb 11.2 oz (60.192 kg)  BMI 22.77 kg/m2  Body mass index is 22.77 kg/(m^2).  Physical Exam: Vital signs reviewed BTD:VVOH is a well-developed well-nourished alert cooperative   who appears stated age in no acute distress.  HEENT: normocephalic atraumatic , Eyes: PERRL EOM's full, conjunctiva clear, Nares: paten,t no deformity discharge or tenderness., Ears: no deformity EAC's clear TMs with normal landmarks. Mouth: clear OP, no lesions, edema.  Moist mucous membranes. Dentition in adequate repair. NECK: supple without masses, thyromegaly or bruits. CHEST/PULM:  Clear to auscultation and percussion breath sounds equal no wheeze , rales or rhonchi. No  chest Liaw deformities or tenderness. CV: PMI is nondisplaced, S1 S2 no gallops, murmurs, rubs. Peripheral pulses are full without delay.No JVD .  ABDOMEN: Bowel sounds normal nontender  No guard or rebound, no hepato splenomegal no CVA tenderness.   Extremtities:  No clubbing cyanosis or edema, no acute joint swelling or redness no focal atrophy NEURO:  Oriented x3, cranial nerves 3-12 appear to be intact, no obvious focal weakness,gait within normal limits no abnormal reflexes or asymmetrical SKIN: No acute rashes normal turgor, color, no  bruising or petechiae. PSYCH: Oriented, good eye contact, no obvious depression anxiety, cognition and judgment appear normal. LN: no cervical axillary inguinal adenopathy No noted deficits in memory, attention, and speech.     ASSESSMENT AND PLAN:  Discussed the following assessment and plan:  Medicare annual wellness visit, subsequent  Hyperlipidemia - Plan: Basic metabolic panel, Lipid panel, TSH, T4, free  Sensorineural hearing loss (SNHL) of both ears - r more than left   mod sever   Essential hypertension - controlled  Lipids bmp  tsh ? Patient Care Team: Burnis Medin, MD as PCP - General Delila Pereyra, MD (Obstetrics and Gynecology) Sydnee Cabal, MD (Orthopedic Surgery) Crista Luria, MD as Attending Physician (Dermatology) amy Howdyshell   Patient Instructions   Continue lifestyle intervention healthy eating and exercise . Will notify you  of labs when available. If all ok then yearly check  Get mammogram yearly for now  If have dense breasts . Can make you own appt.  Continue with seeing dermatology. Can discontinue pap smears  Routine gyne  We can refer if needed or problem arises .   Health Maintenance, Female Adopting a healthy lifestyle and getting preventive care can go a long way to promote health and wellness. Talk with your health care provider about what schedule of regular examinations is right for you. This is a good chance  for you to check in with your provider about disease prevention and staying healthy. In between checkups, there are plenty of things you can do on your own. Experts have done a lot of research about which lifestyle changes and preventive measures are most likely to keep you healthy. Ask your health care provider for more information. WEIGHT AND DIET  Eat a healthy diet  Be sure to include plenty of vegetables, fruits, low-fat dairy products, and lean protein.  Do not eat a lot of foods high in solid fats, added sugars, or salt.  Get regular exercise. This is one of the most important things you can do for your health.  Most adults should exercise for at least 150 minutes each week. The exercise should increase your heart rate and make you sweat (moderate-intensity exercise).  Most adults should also do strengthening exercises at least twice a week. This is in addition to the moderate-intensity exercise.  Maintain a healthy weight  Body mass index (BMI) is a measurement that can be used to identify possible weight problems. It estimates body fat based on height and weight. Your health care provider can help determine your BMI and help you achieve or maintain a healthy weight.  For females 31 years of age and older:   A BMI below 18.5 is considered underweight.  A BMI of 18.5 to 24.9 is normal.  A BMI of 25 to 29.9 is considered overweight.  A BMI of 30 and above is considered obese.  Watch levels of cholesterol and blood lipids  You should start having your blood tested for lipids and cholesterol at 71 years of age, then have this test every 5 years.  You may need to have your cholesterol levels checked more often if:  Your lipid or cholesterol levels are high.  You are older than 71 years of age.  You are at high risk for heart disease.  CANCER SCREENING   Lung Cancer  Lung cancer screening is recommended for adults 35-26 years old who are at high risk for lung cancer  because of a history of smoking.  A yearly low-dose CT  scan of the lungs is recommended for people who:  Currently smoke.  Have quit within the past 15 years.  Have at least a 30-pack-year history of smoking. A pack year is smoking an average of one pack of cigarettes a day for 1 year.  Yearly screening should continue until it has been 15 years since you quit.  Yearly screening should stop if you develop a health problem that would prevent you from having lung cancer treatment.  Breast Cancer  Practice breast self-awareness. This means understanding how your breasts normally appear and feel.  It also means doing regular breast self-exams. Let your health care provider know about any changes, no matter how small.  If you are in your 20s or 30s, you should have a clinical breast exam (CBE) by a health care provider every 1-3 years as part of a regular health exam.  If you are 27 or older, have a CBE every year. Also consider having a breast X-ray (mammogram) every year.  If you have a family history of breast cancer, talk to your health care provider about genetic screening.  If you are at high risk for breast cancer, talk to your health care provider about having an MRI and a mammogram every year.  Breast cancer gene (BRCA) assessment is recommended for women who have family members with BRCA-related cancers. BRCA-related cancers include:  Breast.  Ovarian.  Tubal.  Peritoneal cancers.  Results of the assessment will determine the need for genetic counseling and BRCA1 and BRCA2 testing. Cervical Cancer Your health care provider may recommend that you be screened regularly for cancer of the pelvic organs (ovaries, uterus, and vagina). This screening involves a pelvic examination, including checking for microscopic changes to the surface of your cervix (Pap test). You may be encouraged to have this screening done every 3 years, beginning at age 60.  For women ages 89-65,  health care providers may recommend pelvic exams and Pap testing every 3 years, or they may recommend the Pap and pelvic exam, combined with testing for human papilloma virus (HPV), every 5 years. Some types of HPV increase your risk of cervical cancer. Testing for HPV may also be done on women of any age with unclear Pap test results.  Other health care providers may not recommend any screening for nonpregnant women who are considered low risk for pelvic cancer and who do not have symptoms. Ask your health care provider if a screening pelvic exam is right for you.  If you have had past treatment for cervical cancer or a condition that could lead to cancer, you need Pap tests and screening for cancer for at least 20 years after your treatment. If Pap tests have been discontinued, your risk factors (such as having a new sexual partner) need to be reassessed to determine if screening should resume. Some women have medical problems that increase the chance of getting cervical cancer. In these cases, your health care provider may recommend more frequent screening and Pap tests. Colorectal Cancer  This type of cancer can be detected and often prevented.  Routine colorectal cancer screening usually begins at 72 years of age and continues through 71 years of age.  Your health care provider may recommend screening at an earlier age if you have risk factors for colon cancer.  Your health care provider may also recommend using home test kits to check for hidden blood in the stool.  A small camera at the end of a tube can be used to  examine your colon directly (sigmoidoscopy or colonoscopy). This is done to check for the earliest forms of colorectal cancer.  Routine screening usually begins at age 65.  Direct examination of the colon should be repeated every 5-10 years through 71 years of age. However, you may need to be screened more often if early forms of precancerous polyps or small growths are  found. Skin Cancer  Check your skin from head to toe regularly.  Tell your health care provider about any new moles or changes in moles, especially if there is a change in a mole's shape or color.  Also tell your health care provider if you have a mole that is larger than the size of a pencil eraser.  Always use sunscreen. Apply sunscreen liberally and repeatedly throughout the day.  Protect yourself by wearing long sleeves, pants, a wide-brimmed hat, and sunglasses whenever you are outside. HEART DISEASE, DIABETES, AND HIGH BLOOD PRESSURE   High blood pressure causes heart disease and increases the risk of stroke. High blood pressure is more likely to develop in:  People who have blood pressure in the high end of the normal range (130-139/85-89 mm Hg).  People who are overweight or obese.  People who are African American.  If you are 69-63 years of age, have your blood pressure checked every 3-5 years. If you are 73 years of age or older, have your blood pressure checked every year. You should have your blood pressure measured twice--once when you are at a hospital or clinic, and once when you are not at a hospital or clinic. Record the average of the two measurements. To check your blood pressure when you are not at a hospital or clinic, you can use:  An automated blood pressure machine at a pharmacy.  A home blood pressure monitor.  If you are between 40 years and 90 years old, ask your health care provider if you should take aspirin to prevent strokes.  Have regular diabetes screenings. This involves taking a blood sample to check your fasting blood sugar level.  If you are at a normal weight and have a low risk for diabetes, have this test once every three years after 71 years of age.  If you are overweight and have a high risk for diabetes, consider being tested at a younger age or more often. PREVENTING INFECTION  Hepatitis B  If you have a higher risk for hepatitis B,  you should be screened for this virus. You are considered at high risk for hepatitis B if:  You were born in a country where hepatitis B is common. Ask your health care provider which countries are considered high risk.  Your parents were born in a high-risk country, and you have not been immunized against hepatitis B (hepatitis B vaccine).  You have HIV or AIDS.  You use needles to inject street drugs.  You live with someone who has hepatitis B.  You have had sex with someone who has hepatitis B.  You get hemodialysis treatment.  You take certain medicines for conditions, including cancer, organ transplantation, and autoimmune conditions. Hepatitis C  Blood testing is recommended for:  Everyone born from [birth year] through 1967.  Anyone with known risk factors for hepatitis C. Sexually transmitted infections (STIs)  You should be screened for sexually transmitted infections (STIs) including gonorrhea and chlamydia if:  You are sexually active and are younger than 71 years of age.  You are older than 71 years of age and your health  care provider tells you that you are at risk for this type of infection.  Your sexual activity has changed since you were last screened and you are at an increased risk for chlamydia or gonorrhea. Ask your health care provider if you are at risk.  If you do not have HIV, but are at risk, it may be recommended that you take a prescription medicine daily to prevent HIV infection. This is called pre-exposure prophylaxis (PrEP). You are considered at risk if:  You are sexually active and do not regularly use condoms or know the HIV status of your partner(s).  You take drugs by injection.  You are sexually active with a partner who has HIV. Talk with your health care provider about whether you are at high risk of being infected with HIV. If you choose to begin PrEP, you should first be tested for HIV. You should then be tested every 3 months for as long as  you are taking PrEP.  PREGNANCY   If you are premenopausal and you may become pregnant, ask your health care provider about preconception counseling.  If you may become pregnant, take 400 to 800 micrograms (mcg) of folic acid every day.  If you want to prevent pregnancy, talk to your health care provider about birth control (contraception). OSTEOPOROSIS AND MENOPAUSE   Osteoporosis is a disease in which the bones lose minerals and strength with aging. This can result in serious bone fractures. Your risk for osteoporosis can be identified using a bone density scan.  If you are 58 years of age or older, or if you are at risk for osteoporosis and fractures, ask your health care provider if you should be screened.  Ask your health care provider whether you should take a calcium or vitamin D supplement to lower your risk for osteoporosis.  Menopause may have certain physical symptoms and risks.  Hormone replacement therapy may reduce some of these symptoms and risks. Talk to your health care provider about whether hormone replacement therapy is right for you.  HOME CARE INSTRUCTIONS   Schedule regular health, dental, and eye exams.  Stay current with your immunizations.   Do not use any tobacco products including cigarettes, chewing tobacco, or electronic cigarettes.  If you are pregnant, do not drink alcohol.  If you are breastfeeding, limit how much and how often you drink alcohol.  Limit alcohol intake to no more than 1 drink per day for nonpregnant women. One drink equals 12 ounces of beer, 5 ounces of wine, or 1 ounces of hard liquor.  Do not use street drugs.  Do not share needles.  Ask your health care provider for help if you need support or information about quitting drugs.  Tell your health care provider if you often feel depressed.  Tell your health care provider if you have ever been abused or do not feel safe at home.   This information is not intended to replace  advice given to you by your health care provider. Make sure you discuss any questions you have with your health care provider.   Document Released: 07/28/2010 Document Revised: 02/02/2014 Document Reviewed: 12/14/2012 Elsevier Interactive Patient Education 2016 Jackson K. Rawson Minix [name]  Lab Results  Component Value Date   WBC 5.9 10/27/2013   HGB 13.6 10/27/2013   HCT 39.9 10/27/2013   PLT 215.0 10/27/2013   GLUCOSE 92 12/12/2014   CHOL 244* 12/12/2014   TRIG 94.0 12/12/2014   HDL 78.40  12/12/2014   LDLDIRECT 144.5 01/11/2013   LDLCALC 146* 12/12/2014   ALT 25 10/27/2013   AST 24 10/27/2013   NA 140 12/12/2014   K 4.1 12/12/2014   CL 104 12/12/2014   CREATININE 0.79 12/12/2014   BUN 14 12/12/2014   CO2 28 12/12/2014   TSH 1.18 12/12/2014

## 2014-12-15 ENCOUNTER — Encounter: Payer: Self-pay | Admitting: Internal Medicine

## 2014-12-17 DIAGNOSIS — H5203 Hypermetropia, bilateral: Secondary | ICD-10-CM | POA: Diagnosis not present

## 2014-12-17 DIAGNOSIS — H40003 Preglaucoma, unspecified, bilateral: Secondary | ICD-10-CM | POA: Diagnosis not present

## 2014-12-17 DIAGNOSIS — H25013 Cortical age-related cataract, bilateral: Secondary | ICD-10-CM | POA: Diagnosis not present

## 2014-12-17 LAB — HM DIABETES EYE EXAM

## 2014-12-19 ENCOUNTER — Other Ambulatory Visit: Payer: Self-pay | Admitting: Family Medicine

## 2014-12-19 ENCOUNTER — Encounter: Payer: Self-pay | Admitting: Family Medicine

## 2014-12-28 ENCOUNTER — Encounter: Payer: Self-pay | Admitting: Internal Medicine

## 2014-12-29 NOTE — Telephone Encounter (Signed)
If she never had a hep c screen she can choose to do this .   High risk situation  Is  transfusion in the 70s  And 80s  IVDU needle sticks and tatoos in early days  But if she has donated blood in the last 15- 20 years she has been screened.

## 2015-01-17 DIAGNOSIS — L820 Inflamed seborrheic keratosis: Secondary | ICD-10-CM | POA: Diagnosis not present

## 2015-01-17 DIAGNOSIS — D485 Neoplasm of uncertain behavior of skin: Secondary | ICD-10-CM | POA: Diagnosis not present

## 2015-01-17 DIAGNOSIS — L570 Actinic keratosis: Secondary | ICD-10-CM | POA: Diagnosis not present

## 2015-01-17 DIAGNOSIS — L821 Other seborrheic keratosis: Secondary | ICD-10-CM | POA: Diagnosis not present

## 2015-01-17 DIAGNOSIS — Z23 Encounter for immunization: Secondary | ICD-10-CM | POA: Diagnosis not present

## 2015-01-17 DIAGNOSIS — Z808 Family history of malignant neoplasm of other organs or systems: Secondary | ICD-10-CM | POA: Diagnosis not present

## 2015-01-17 DIAGNOSIS — D225 Melanocytic nevi of trunk: Secondary | ICD-10-CM | POA: Diagnosis not present

## 2015-01-17 DIAGNOSIS — Z85828 Personal history of other malignant neoplasm of skin: Secondary | ICD-10-CM | POA: Diagnosis not present

## 2015-01-17 DIAGNOSIS — L900 Lichen sclerosus et atrophicus: Secondary | ICD-10-CM | POA: Diagnosis not present

## 2015-01-18 ENCOUNTER — Other Ambulatory Visit: Payer: Self-pay | Admitting: Internal Medicine

## 2015-01-30 DIAGNOSIS — H40003 Preglaucoma, unspecified, bilateral: Secondary | ICD-10-CM | POA: Diagnosis not present

## 2015-03-27 DIAGNOSIS — Z1231 Encounter for screening mammogram for malignant neoplasm of breast: Secondary | ICD-10-CM | POA: Diagnosis not present

## 2015-03-27 LAB — HM MAMMOGRAPHY

## 2015-03-29 ENCOUNTER — Encounter: Payer: Self-pay | Admitting: Family Medicine

## 2015-07-04 ENCOUNTER — Other Ambulatory Visit: Payer: Self-pay | Admitting: General Practice

## 2015-07-04 MED ORDER — LOSARTAN POTASSIUM 50 MG PO TABS: 50.0000 mg | ORAL_TABLET | Freq: Every day | ORAL | Status: DC

## 2015-07-16 ENCOUNTER — Encounter: Payer: Self-pay | Admitting: Internal Medicine

## 2015-07-16 ENCOUNTER — Ambulatory Visit (INDEPENDENT_AMBULATORY_CARE_PROVIDER_SITE_OTHER): Payer: Medicare Other | Admitting: Internal Medicine

## 2015-07-16 VITALS — BP 122/72 | Temp 98.2°F | Ht 64.0 in | Wt 134.8 lb

## 2015-07-16 DIAGNOSIS — M5440 Lumbago with sciatica, unspecified side: Secondary | ICD-10-CM

## 2015-07-16 DIAGNOSIS — M549 Dorsalgia, unspecified: Secondary | ICD-10-CM | POA: Diagnosis not present

## 2015-07-16 MED ORDER — LOSARTAN POTASSIUM 50 MG PO TABS: 50.0000 mg | ORAL_TABLET | Freq: Every day | ORAL | Status: DC

## 2015-07-16 NOTE — Patient Instructions (Addendum)
Sounds like  scaitica     Sx .  And mechanical back pain .  Consider yoga other exercises . physical therapy .   But plan on getting dr Nonie Hoyer to see you .  May need a specific exercise  Program .  Contact us if fever etc .    Sciatica With Rehab The sciatic nerve runs from the back down the leg and is responsible for sensation and control of the muscles in the back (posterior) side of the thigh, lower leg, and foot. Sciatica is a condition that is characterized by inflammation of this nerve.  SYMPTOMS   Signs of nerve damage, including numbness and/or weakness along the posterior side of the lower extremity.  Pain in the back of the thigh that may also travel down the leg.  Pain that worsens when sitting for long periods of time.  Occasionally, pain in the back or buttock. CAUSES  Inflammation of the sciatic nerve is the cause of sciatica. The inflammation is due to something irritating the nerve. Common sources of irritation include:  Sitting for long periods of time.  Direct trauma to the nerve.  Arthritis of the spine.  Herniated or ruptured disk.  Slipping of the vertebrae (spondylolisthesis).  Pressure from soft tissues, such as muscles or ligament-like tissue (fascia). RISK INCREASES WITH:  Sports that place pressure or stress on the spine (football or weightlifting).  Poor strength and flexibility.  Failure to warm up properly before activity.  Family history of low back pain or disk disorders.  Previous back injury or surgery.  Poor body mechanics, especially when lifting, or poor posture. PREVENTION   Warm up and stretch properly before activity.  Maintain physical fitness:  Strength, flexibility, and endurance.  Cardiovascular fitness.  Learn and use proper technique, especially with posture and lifting. When possible, have coach correct improper technique.  Avoid activities that place stress on the spine. PROGNOSIS If treated properly, then  sciatica usually resolves within 6 weeks. However, occasionally surgery is necessary.  RELATED COMPLICATIONS   Permanent nerve damage, including pain, numbness, tingle, or weakness.  Chronic back pain.  Risks of surgery: infection, bleeding, nerve damage, or damage to surrounding tissues. TREATMENT Treatment initially involves resting from any activities that aggravate your symptoms. The use of ice and medication may help reduce pain and inflammation. The use of strengthening and stretching exercises may help reduce pain with activity. These exercises may be performed at home or with referral to a therapist. A therapist may recommend further treatments, such as transcutaneous electronic nerve stimulation (TENS) or ultrasound. Your caregiver may recommend corticosteroid injections to help reduce inflammation of the sciatic nerve. If symptoms persist despite non-surgical (conservative) treatment, then surgery may be recommended. MEDICATION  If pain medication is necessary, then nonsteroidal anti-inflammatory medications, such as aspirin and ibuprofen, or other minor pain relievers, such as acetaminophen, are often recommended.  Do not take pain medication for 7 days before surgery.  Prescription pain relievers may be given if deemed necessary by your caregiver. Use only as directed and only as much as you need.  Ointments applied to the skin may be helpful.  Corticosteroid injections may be given by your caregiver. These injections should be reserved for the most serious cases, because they may only be given a certain number of times. HEAT AND COLD  Cold treatment (icing) relieves pain and reduces inflammation. Cold treatment should be applied for 10 to 15 minutes every 2 to 3 hours for inflammation and pain  and immediately after any activity that aggravates your symptoms. Use ice packs or massage the area with a piece of ice (ice massage).  Heat treatment may be used prior to performing the  stretching and strengthening activities prescribed by your caregiver, physical therapist, or athletic trainer. Use a heat pack or soak the injury in warm water. SEEK MEDICAL CARE IF:  Treatment seems to offer no benefit, or the condition worsens.  Any medications produce adverse side effects. EXERCISES  RANGE OF MOTION (ROM) AND STRETCHING EXERCISES - Sciatica Most people with sciatic will find that their symptoms worsen with either excessive bending forward (flexion) or arching at the low back (extension). The exercises which will help resolve your symptoms will focus on the opposite motion. Your physician, physical therapist or athletic trainer will help you determine which exercises will be most helpful to resolve your low back pain. Do not complete any exercises without first consulting with your clinician. Discontinue any exercises which worsen your symptoms until you speak to your clinician. If you have pain, numbness or tingling which travels down into your buttocks, leg or foot, the goal of the therapy is for these symptoms to move closer to your back and eventually resolve. Occasionally, these leg symptoms will get better, but your low back pain may worsen; this is typically an indication of progress in your rehabilitation. Be certain to be very alert to any changes in your symptoms and the activities in which you participated in the 24 hours prior to the change. Sharing this information with your clinician will allow him/her to most efficiently treat your condition. These exercises may help you when beginning to rehabilitate your injury. Your symptoms may resolve with or without further involvement from your physician, physical therapist or athletic trainer. While completing these exercises, remember:   Restoring tissue flexibility helps normal motion to return to the joints. This allows healthier, less painful movement and activity.  An effective stretch should be held for at least 30  seconds.  A stretch should never be painful. You should only feel a gentle lengthening or release in the stretched tissue. FLEXION RANGE OF MOTION AND STRETCHING EXERCISES: STRETCH - Flexion, Single Knee to Chest   Lie on a firm bed or floor with both legs extended in front of you.  Keeping one leg in contact with the floor, bring your opposite knee to your chest. Hold your leg in place by either grabbing behind your thigh or at your knee.  Pull until you feel a gentle stretch in your low back. Hold __________ seconds.  Slowly release your grasp and repeat the exercise with the opposite side. Repeat __________ times. Complete this exercise __________ times per day.  STRETCH - Flexion, Double Knee to Chest  Lie on a firm bed or floor with both legs extended in front of you.  Keeping one leg in contact with the floor, bring your opposite knee to your chest.  Tense your stomach muscles to support your back and then lift your other knee to your chest. Hold your legs in place by either grabbing behind your thighs or at your knees.  Pull both knees toward your chest until you feel a gentle stretch in your low back. Hold __________ seconds.  Tense your stomach muscles and slowly return one leg at a time to the floor. Repeat __________ times. Complete this exercise __________ times per day.  STRETCH - Low Trunk Rotation   Lie on a firm bed or floor. Keeping your legs in  front of you, bend your knees so they are both pointed toward the ceiling and your feet are flat on the floor.  Extend your arms out to the side. This will stabilize your upper body by keeping your shoulders in contact with the floor.  Gently and slowly drop both knees together to one side until you feel a gentle stretch in your low back. Hold for __________ seconds.  Tense your stomach muscles to support your low back as you bring your knees back to the starting position. Repeat the exercise to the other side. Repeat  __________ times. Complete this exercise __________ times per day  EXTENSION RANGE OF MOTION AND FLEXIBILITY EXERCISES: STRETCH - Extension, Prone on Elbows  Lie on your stomach on the floor, a bed will be too soft. Place your palms about shoulder width apart and at the height of your head.  Place your elbows under your shoulders. If this is too painful, stack pillows under your chest.  Allow your body to relax so that your hips drop lower and make contact more completely with the floor.  Hold this position for __________ seconds.  Slowly return to lying flat on the floor. Repeat __________ times. Complete this exercise __________ times per day.  RANGE OF MOTION - Extension, Prone Press Ups  Lie on your stomach on the floor, a bed will be too soft. Place your palms about shoulder width apart and at the height of your head.  Keeping your back as relaxed as possible, slowly straighten your elbows while keeping your hips on the floor. You may adjust the placement of your hands to maximize your comfort. As you gain motion, your hands will come more underneath your shoulders.  Hold this position __________ seconds.  Slowly return to lying flat on the floor. Repeat __________ times. Complete this exercise __________ times per day.  STRENGTHENING EXERCISES - Sciatica  These exercises may help you when beginning to rehabilitate your injury. These exercises should be done near your "sweet spot." This is the neutral, low-back arch, somewhere between fully rounded and fully arched, that is your least painful position. When performed in this safe range of motion, these exercises can be used for people who have either a flexion or extension based injury. These exercises may resolve your symptoms with or without further involvement from your physician, physical therapist or athletic trainer. While completing these exercises, remember:   Muscles can gain both the endurance and the strength needed for  everyday activities through controlled exercises.  Complete these exercises as instructed by your physician, physical therapist or athletic trainer. Progress with the resistance and repetition exercises only as your caregiver advises.  You may experience muscle soreness or fatigue, but the pain or discomfort you are trying to eliminate should never worsen during these exercises. If this pain does worsen, stop and make certain you are following the directions exactly. If the pain is still present after adjustments, discontinue the exercise until you can discuss the trouble with your clinician. STRENGTHENING - Deep Abdominals, Pelvic Tilt   Lie on a firm bed or floor. Keeping your legs in front of you, bend your knees so they are both pointed toward the ceiling and your feet are flat on the floor.  Tense your lower abdominal muscles to press your low back into the floor. This motion will rotate your pelvis so that your tail bone is scooping upwards rather than pointing at your feet or into the floor.  With a gentle tension and  even breathing, hold this position for __________ seconds. Repeat __________ times. Complete this exercise __________ times per day.  STRENGTHENING - Abdominals, Crunches   Lie on a firm bed or floor. Keeping your legs in front of you, bend your knees so they are both pointed toward the ceiling and your feet are flat on the floor. Cross your arms over your chest.  Slightly tip your chin down without bending your neck.  Tense your abdominals and slowly lift your trunk high enough to just clear your shoulder blades. Lifting higher can put excessive stress on the low back and does not further strengthen your abdominal muscles.  Control your return to the starting position. Repeat __________ times. Complete this exercise __________ times per day.  STRENGTHENING - Quadruped, Opposite UE/LE Lift  Assume a hands and knees position on a firm surface. Keep your hands under your  shoulders and your knees under your hips. You may place padding under your knees for comfort.  Find your neutral spine and gently tense your abdominal muscles so that you can maintain this position. Your shoulders and hips should form a rectangle that is parallel with the floor and is not twisted.  Keeping your trunk steady, lift your right hand no higher than your shoulder and then your left leg no higher than your hip. Make sure you are not holding your breath. Hold this position __________ seconds.  Continuing to keep your abdominal muscles tense and your back steady, slowly return to your starting position. Repeat with the opposite arm and leg. Repeat __________ times. Complete this exercise __________ times per day.  STRENGTHENING - Abdominals and Quadriceps, Straight Leg Raise   Lie on a firm bed or floor with both legs extended in front of you.  Keeping one leg in contact with the floor, bend the other knee so that your foot can rest flat on the floor.  Find your neutral spine, and tense your abdominal muscles to maintain your spinal position throughout the exercise.  Slowly lift your straight leg off the floor about 6 inches for a count of 15, making sure to not hold your breath.  Still keeping your neutral spine, slowly lower your leg all the way to the floor. Repeat this exercise with each leg __________ times. Complete this exercise __________ times per day. POSTURE AND BODY MECHANICS CONSIDERATIONS - Sciatica Keeping correct posture when sitting, standing or completing your activities will reduce the stress put on different body tissues, allowing injured tissues a chance to heal and limiting painful experiences. The following are general guidelines for improved posture. Your physician or physical therapist will provide you with any instructions specific to your needs. While reading these guidelines, remember:  The exercises prescribed by your provider will help you have the  flexibility and strength to maintain correct postures.  The correct posture provides the optimal environment for your joints to work. All of your joints have less wear and tear when properly supported by a spine with good posture. This means you will experience a healthier, less painful body.  Correct posture must be practiced with all of your activities, especially prolonged sitting and standing. Correct posture is as important when doing repetitive low-stress activities (typing) as it is when doing a single heavy-load activity (lifting). RESTING POSITIONS Consider which positions are most painful for you when choosing a resting position. If you have pain with flexion-based activities (sitting, bending, stooping, squatting), choose a position that allows you to rest in a less flexed posture. You  would want to avoid curling into a fetal position on your side. If your pain worsens with extension-based activities (prolonged standing, working overhead), avoid resting in an extended position such as sleeping on your stomach. Most people will find more comfort when they rest with their spine in a more neutral position, neither too rounded nor too arched. Lying on a non-sagging bed on your side with a pillow between your knees, or on your back with a pillow under your knees will often provide some relief. Keep in mind, being in any one position for a prolonged period of time, no matter how correct your posture, can still lead to stiffness. PROPER SITTING POSTURE In order to minimize stress and discomfort on your spine, you must sit with correct posture Sitting with good posture should be effortless for a healthy body. Returning to good posture is a gradual process. Many people can work toward this most comfortably by using various supports until they have the flexibility and strength to maintain this posture on their own. When sitting with proper posture, your ears will fall over your shoulders and your shoulders  will fall over your hips. You should use the back of the chair to support your upper back. Your low back will be in a neutral position, just slightly arched. You may place a small pillow or folded towel at the base of your low back for support.  When working at a desk, create an environment that supports good, upright posture. Without extra support, muscles fatigue and lead to excessive strain on joints and other tissues. Keep these recommendations in mind: CHAIR:   A chair should be able to slide under your desk when your back makes contact with the back of the chair. This allows you to work closely.  The chair's height should allow your eyes to be level with the upper part of your monitor and your hands to be slightly lower than your elbows. BODY POSITION  Your feet should make contact with the floor. If this is not possible, use a foot rest.  Keep your ears over your shoulders. This will reduce stress on your neck and low back. INCORRECT SITTING POSTURES   If you are feeling tired and unable to assume a healthy sitting posture, do not slouch or slump. This puts excessive strain on your back tissues, causing more damage and pain. Healthier options include:  Using more support, like a lumbar pillow.  Switching tasks to something that requires you to be upright or walking.  Talking a brief walk.  Lying down to rest in a neutral-spine position. PROLONGED STANDING WHILE SLIGHTLY LEANING FORWARD  When completing a task that requires you to lean forward while standing in one place for a long time, place either foot up on a stationary 2-4 inch high object to help maintain the best posture. When both feet are on the ground, the low back tends to lose its slight inward curve. If this curve flattens (or becomes too large), then the back and your other joints will experience too much stress, fatigue more quickly and can cause pain.  CORRECT STANDING POSTURES Proper standing posture should be assumed  with all daily activities, even if they only take a few moments, like when brushing your teeth. As in sitting, your ears should fall over your shoulders and your shoulders should fall over your hips. You should keep a slight tension in your abdominal muscles to brace your spine. Your tailbone should point down to the ground, not behind  your body, resulting in an over-extended swayback posture.  INCORRECT STANDING POSTURES  Common incorrect standing postures include a forward head, locked knees and/or an excessive swayback. WALKING Walk with an upright posture. Your ears, shoulders and hips should all line-up. PROLONGED ACTIVITY IN A FLEXED POSITION When completing a task that requires you to bend forward at your waist or lean over a low surface, try to find a way to stabilize 3 of 4 of your limbs. You can place a hand or elbow on your thigh or rest a knee on the surface you are reaching across. This will provide you more stability so that your muscles do not fatigue as quickly. By keeping your knees relaxed, or slightly bent, you will also reduce stress across your low back. CORRECT LIFTING TECHNIQUES DO :   Assume a wide stance. This will provide you more stability and the opportunity to get as close as possible to the object which you are lifting.  Tense your abdominals to brace your spine; then bend at the knees and hips. Keeping your back locked in a neutral-spine position, lift using your leg muscles. Lift with your legs, keeping your back straight.  Test the weight of unknown objects before attempting to lift them.  Try to keep your elbows locked down at your sides in order get the best strength from your shoulders when carrying an object.  Always ask for help when lifting heavy or awkward objects. INCORRECT LIFTING TECHNIQUES DO NOT:   Lock your knees when lifting, even if it is a small object.  Bend and twist. Pivot at your feet or move your feet when needing to change  directions.  Assume that you cannot safely pick up a paperclip without proper posture.   This information is not intended to replace advice given to you by your health care provider. Make sure you discuss any questions you have with your health care provider.   Document Released: 01/12/2005 Document Revised: 05/29/2014 Document Reviewed: 04/26/2008 [employer] Interactive Patient Education [employer].

## 2015-07-16 NOTE — Progress Notes (Signed)
Pre visit review using our clinic review tool, if applicable. No additional management support is needed unless otherwise documented below in the visit note.  Chief Complaint  Patient presents with  . Back Pain    Ongong for several months.  Worsened in the past week.  . Left Leg Pain    HPI: Sheila Livingston 70 y.o.  Comes in for sda   appt   Back  discomfort  off and on for years   This past week  Worsening  And began top Ache all the time   And used ice heat and notas bad .  Left si area and down to buttocks and down to knee .  Husband  Says exercise  makes worse .  But she says needs tp stay active  Hx of ddd but no surgery   No waeknss fever chills  Constant numbess or injury ROS: See pertinent positives and negatives per HPI.  Past Medical History  Diagnosis Date  . Hypertension   . Hyperplastic colon polyp   . Allergy   . Endometriosis   . IBS (irritable bowel syndrome)   . History of skin cancer   . Anal fissure   . Hemorrhoids   . Hyperlipidemia   . Benign paroxysmal positional vertigo     Family History  Problem Relation Age of Onset  . Diabetes Mother   . Breast cancer Sister   . Colon polyps Sister   . Stroke Mother   . Diabetes Sister   . Colon cancer Neg Hx   . Colon polyps Sister     Social History   Social History  . Marital Status: Widowed    Spouse Name: N/A  . Number of Children: N/A  . Years of Education: N/A   Social History Main Topics  . Smoking status: Former Research scientist (life sciences)  . Smokeless tobacco: Never Used  . Alcohol Use: 0.0 oz/week    0 Standard drinks or equivalent per week     Comment: rare  . Drug Use: No  . Sexual Activity: Not Asked   Other Topics Concern  . 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      Outpatient Prescriptions Prior to Visit  Medication Sig Dispense Refill  . aspirin 81 MG chewable tablet Chew 81 mg by mouth daily.      . Cholecalciferol (VITAMIN D3) 400 UNITS CAPS Take 1 capsule by mouth daily.    . clobetasol ointment (TEMOVATE) 0.05 % Apply topically 2 (two) times daily.      Marland Kitchen desonide (DESOWEN) 0.05 % ointment Apply 1 application topically 2 (two) times daily.      . Flaxseed, Linseed, (FLAX SEED OIL PO) Take by mouth.      . hydrocortisone 2.5 % cream Apply topically 2 (two) times daily.      Marland Kitchen ibuprofen (ADVIL,MOTRIN) 200 MG tablet Take 200 mg by mouth every 6 (six) hours as needed.      . MULTIPLE VITAMIN PO Take by mouth.      . Omega-3 Fatty Acids (FISH OIL PO) Take by mouth.    . pramoxine-hydrocortisone (ANALPRAM HC) cream  Apply topically 3 (three) times daily. 30 g 2  . pseudoephedrine (SUDAFED) 30 MG tablet Take 30 mg by mouth every 4 (four) hours as needed.      . valACYclovir (VALTREX) 1000 MG tablet Take 2 tablets (2,000 mg total) by mouth 2 (two) times daily. As needed for cold sores 30 tablet 3  . losartan (COZAAR) 50 MG tablet Take 1 tablet (50 mg total) by mouth daily. 90 tablet 0   No facility-administered medications prior to visit.     EXAM:  BP 122/72 mmHg  Temp(Src) 98.2 F (36.8 C) (Oral)  Ht 5\' 4"  (1.626 m)  Wt 134 lb 12.8 oz (61.145 kg)  BMI 23.13 kg/m2  Body mass index is 23.13 kg/(m^2).  GENERAL: vitals reviewed and listed above, alert, oriented, appears well hydrated and in no acute distress HEENT: atraumatic, conjunctiva  clear, no obvious abnormalities on inspection of external nose and ears  NECK: no obvious masses on inspection  CV: HRRR, no clubbing cyanosis or  peripheral edema nl cap refill  MS: moves all extremities without noticeable focal  Abnormality  Left si paraspinal area tenderness  Gait normal  Toe heel walk nl neg slr   No atrophy   PSYCH: pleasant and cooperative, no obvious depression or  anxiety  ASSESSMENT AND PLAN:  Discussed the following assessment and plan:  Low back pain with sciatica, sciatica laterality unspecified, unspecified back pain laterality - Plan: Ambulatory referral to Sports Medicine  Back pain, unspecified location dsic physical modalities   nsaid not helping    optinos disc  Pt yoga chiro if wishes  Will proceed with SM consult and   Give exercise to do  -Patient advised to return or notify health care team  if symptoms worsen ,persist or new concerns arise.  Patient Instructions  Sounds like  scaitica     Sx .  And mechanical back pain .  Consider yoga other exercises . physical therapy .   But plan on getting dr Nonie Hoyer to see you .  May need a specific exercise  Program .  Contact us if fever etc .    Sciatica With Rehab The sciatic nerve runs from the back down the leg and is responsible for sensation and control of the muscles in the back (posterior) side of the thigh, lower leg, and foot. Sciatica is a condition that is characterized by inflammation of this nerve.  SYMPTOMS   Signs of nerve damage, including numbness and/or weakness along the posterior side of the lower extremity.  Pain in the back of the thigh that may also travel down the leg.  Pain that worsens when sitting for long periods of time.  Occasionally, pain in the back or buttock. CAUSES  Inflammation of the sciatic nerve is the cause of sciatica. The inflammation is due to something irritating the nerve. Common sources of irritation include:  Sitting for long periods of time.  Direct trauma to the nerve.  Arthritis of the spine.  Herniated or ruptured disk.  Slipping of the vertebrae (spondylolisthesis).  Pressure from soft tissues, such as muscles or ligament-like tissue (fascia). RISK INCREASES WITH:  Sports that place pressure or stress on the spine (football or weightlifting).  Poor strength and flexibility.  Failure to warm up properly before  activity.  Family history of low back pain or disk disorders.  Previous back injury or surgery.  Poor body mechanics, especially when lifting, or poor posture. PREVENTION   Warm up and stretch properly  before activity.  Maintain physical fitness:  Strength, flexibility, and endurance.  Cardiovascular fitness.  Learn and use proper technique, especially with posture and lifting. When possible, have coach correct improper technique.  Avoid activities that place stress on the spine. PROGNOSIS If treated properly, then sciatica usually resolves within 6 weeks. However, occasionally surgery is necessary.  RELATED COMPLICATIONS   Permanent nerve damage, including pain, numbness, tingle, or weakness.  Chronic back pain.  Risks of surgery: infection, bleeding, nerve damage, or damage to surrounding tissues. TREATMENT Treatment initially involves resting from any activities that aggravate your symptoms. The use of ice and medication may help reduce pain and inflammation. The use of strengthening and stretching exercises may help reduce pain with activity. These exercises may be performed at home or with referral to a therapist. A therapist may recommend further treatments, such as transcutaneous electronic nerve stimulation (TENS) or ultrasound. Your caregiver may recommend corticosteroid injections to help reduce inflammation of the sciatic nerve. If symptoms persist despite non-surgical (conservative) treatment, then surgery may be recommended. MEDICATION  If pain medication is necessary, then nonsteroidal anti-inflammatory medications, such as aspirin and ibuprofen, or other minor pain relievers, such as acetaminophen, are often recommended.  Do not take pain medication for 7 days before surgery.  Prescription pain relievers may be given if deemed necessary by your caregiver. Use only as directed and only as much as you need.  Ointments applied to the skin may be  helpful.  Corticosteroid injections may be given by your caregiver. These injections should be reserved for the most serious cases, because they may only be given a certain number of times. HEAT AND COLD  Cold treatment (icing) relieves pain and reduces inflammation. Cold treatment should be applied for 10 to 15 minutes every 2 to 3 hours for inflammation and pain and immediately after any activity that aggravates your symptoms. Use ice packs or massage the area with a piece of ice (ice massage).  Heat treatment may be used prior to performing the stretching and strengthening activities prescribed by your caregiver, physical therapist, or athletic trainer. Use a heat pack or soak the injury in warm water. SEEK MEDICAL CARE IF:  Treatment seems to offer no benefit, or the condition worsens.  Any medications produce adverse side effects. EXERCISES  RANGE OF MOTION (ROM) AND STRETCHING EXERCISES - Sciatica Most people with sciatic will find that their symptoms worsen with either excessive bending forward (flexion) or arching at the low back (extension). The exercises which will help resolve your symptoms will focus on the opposite motion. Your physician, physical therapist or athletic trainer will help you determine which exercises will be most helpful to resolve your low back pain. Do not complete any exercises without first consulting with your clinician. Discontinue any exercises which worsen your symptoms until you speak to your clinician. If you have pain, numbness or tingling which travels down into your buttocks, leg or foot, the goal of the therapy is for these symptoms to move closer to your back and eventually resolve. Occasionally, these leg symptoms will get better, but your low back pain may worsen; this is typically an indication of progress in your rehabilitation. Be certain to be very alert to any changes in your symptoms and the activities in which you participated in the 24 hours prior  to the change. Sharing this information with your clinician will allow him/her to most efficiently treat your condition. These exercises may help you when beginning to rehabilitate your injury. Your symptoms  may resolve with or without further involvement from your physician, physical therapist or athletic trainer. While completing these exercises, remember:   Restoring tissue flexibility helps normal motion to return to the joints. This allows healthier, less painful movement and activity.  An effective stretch should be held for at least 30 seconds.  A stretch should never be painful. You should only feel a gentle lengthening or release in the stretched tissue. FLEXION RANGE OF MOTION AND STRETCHING EXERCISES: STRETCH - Flexion, Single Knee to Chest   Lie on a firm bed or floor with both legs extended in front of you.  Keeping one leg in contact with the floor, bring your opposite knee to your chest. Hold your leg in place by either grabbing behind your thigh or at your knee.  Pull until you feel a gentle stretch in your low back. Hold __________ seconds.  Slowly release your grasp and repeat the exercise with the opposite side. Repeat __________ times. Complete this exercise __________ times per day.  STRETCH - Flexion, Double Knee to Chest  Lie on a firm bed or floor with both legs extended in front of you.  Keeping one leg in contact with the floor, bring your opposite knee to your chest.  Tense your stomach muscles to support your back and then lift your other knee to your chest. Hold your legs in place by either grabbing behind your thighs or at your knees.  Pull both knees toward your chest until you feel a gentle stretch in your low back. Hold __________ seconds.  Tense your stomach muscles and slowly return one leg at a time to the floor. Repeat __________ times. Complete this exercise __________ times per day.  STRETCH - Low Trunk Rotation   Lie on a firm bed or floor.  Keeping your legs in front of you, bend your knees so they are both pointed toward the ceiling and your feet are flat on the floor.  Extend your arms out to the side. This will stabilize your upper body by keeping your shoulders in contact with the floor.  Gently and slowly drop both knees together to one side until you feel a gentle stretch in your low back. Hold for __________ seconds.  Tense your stomach muscles to support your low back as you bring your knees back to the starting position. Repeat the exercise to the other side. Repeat __________ times. Complete this exercise __________ times per day  EXTENSION RANGE OF MOTION AND FLEXIBILITY EXERCISES: STRETCH - Extension, Prone on Elbows  Lie on your stomach on the floor, a bed will be too soft. Place your palms about shoulder width apart and at the height of your head.  Place your elbows under your shoulders. If this is too painful, stack pillows under your chest.  Allow your body to relax so that your hips drop lower and make contact more completely with the floor.  Hold this position for __________ seconds.  Slowly return to lying flat on the floor. Repeat __________ times. Complete this exercise __________ times per day.  RANGE OF MOTION - Extension, Prone Press Ups  Lie on your stomach on the floor, a bed will be too soft. Place your palms about shoulder width apart and at the height of your head.  Keeping your back as relaxed as possible, slowly straighten your elbows while keeping your hips on the floor. You may adjust the placement of your hands to maximize your comfort. As you gain motion, your hands will come  more underneath your shoulders.  Hold this position __________ seconds.  Slowly return to lying flat on the floor. Repeat __________ times. Complete this exercise __________ times per day.  STRENGTHENING EXERCISES - Sciatica  These exercises may help you when beginning to rehabilitate your injury. These exercises  should be done near your "sweet spot." This is the neutral, low-back arch, somewhere between fully rounded and fully arched, that is your least painful position. When performed in this safe range of motion, these exercises can be used for people who have either a flexion or extension based injury. These exercises may resolve your symptoms with or without further involvement from your physician, physical therapist or athletic trainer. While completing these exercises, remember:   Muscles can gain both the endurance and the strength needed for everyday activities through controlled exercises.  Complete these exercises as instructed by your physician, physical therapist or athletic trainer. Progress with the resistance and repetition exercises only as your caregiver advises.  You may experience muscle soreness or fatigue, but the pain or discomfort you are trying to eliminate should never worsen during these exercises. If this pain does worsen, stop and make certain you are following the directions exactly. If the pain is still present after adjustments, discontinue the exercise until you can discuss the trouble with your clinician. STRENGTHENING - Deep Abdominals, Pelvic Tilt   Lie on a firm bed or floor. Keeping your legs in front of you, bend your knees so they are both pointed toward the ceiling and your feet are flat on the floor.  Tense your lower abdominal muscles to press your low back into the floor. This motion will rotate your pelvis so that your tail bone is scooping upwards rather than pointing at your feet or into the floor.  With a gentle tension and even breathing, hold this position for __________ seconds. Repeat __________ times. Complete this exercise __________ times per day.  STRENGTHENING - Abdominals, Crunches   Lie on a firm bed or floor. Keeping your legs in front of you, bend your knees so they are both pointed toward the ceiling and your feet are flat on the floor. Cross your  arms over your chest.  Slightly tip your chin down without bending your neck.  Tense your abdominals and slowly lift your trunk high enough to just clear your shoulder blades. Lifting higher can put excessive stress on the low back and does not further strengthen your abdominal muscles.  Control your return to the starting position. Repeat __________ times. Complete this exercise __________ times per day.  STRENGTHENING - Quadruped, Opposite UE/LE Lift  Assume a hands and knees position on a firm surface. Keep your hands under your shoulders and your knees under your hips. You may place padding under your knees for comfort.  Find your neutral spine and gently tense your abdominal muscles so that you can maintain this position. Your shoulders and hips should form a rectangle that is parallel with the floor and is not twisted.  Keeping your trunk steady, lift your right hand no higher than your shoulder and then your left leg no higher than your hip. Make sure you are not holding your breath. Hold this position __________ seconds.  Continuing to keep your abdominal muscles tense and your back steady, slowly return to your starting position. Repeat with the opposite arm and leg. Repeat __________ times. Complete this exercise __________ times per day.  STRENGTHENING - Abdominals and Quadriceps, Straight Leg Raise   Lie on a  firm bed or floor with both legs extended in front of you.  Keeping one leg in contact with the floor, bend the other knee so that your foot can rest flat on the floor.  Find your neutral spine, and tense your abdominal muscles to maintain your spinal position throughout the exercise.  Slowly lift your straight leg off the floor about 6 inches for a count of 15, making sure to not hold your breath.  Still keeping your neutral spine, slowly lower your leg all the way to the floor. Repeat this exercise with each leg __________ times. Complete this exercise __________ times  per day. POSTURE AND BODY MECHANICS CONSIDERATIONS - Sciatica Keeping correct posture when sitting, standing or completing your activities will reduce the stress put on different body tissues, allowing injured tissues a chance to heal and limiting painful experiences. The following are general guidelines for improved posture. Your physician or physical therapist will provide you with any instructions specific to your needs. While reading these guidelines, remember:  The exercises prescribed by your provider will help you have the flexibility and strength to maintain correct postures.  The correct posture provides the optimal environment for your joints to work. All of your joints have less wear and tear when properly supported by a spine with good posture. This means you will experience a healthier, less painful body.  Correct posture must be practiced with all of your activities, especially prolonged sitting and standing. Correct posture is as important when doing repetitive low-stress activities (typing) as it is when doing a single heavy-load activity (lifting). RESTING POSITIONS Consider which positions are most painful for you when choosing a resting position. If you have pain with flexion-based activities (sitting, bending, stooping, squatting), choose a position that allows you to rest in a less flexed posture. You would want to avoid curling into a fetal position on your side. If your pain worsens with extension-based activities (prolonged standing, working overhead), avoid resting in an extended position such as sleeping on your stomach. Most people will find more comfort when they rest with their spine in a more neutral position, neither too rounded nor too arched. Lying on a non-sagging bed on your side with a pillow between your knees, or on your back with a pillow under your knees will often provide some relief. Keep in mind, being in any one position for a prolonged period of time, no matter  how correct your posture, can still lead to stiffness. PROPER SITTING POSTURE In order to minimize stress and discomfort on your spine, you must sit with correct posture Sitting with good posture should be effortless for a healthy body. Returning to good posture is a gradual process. Many people can work toward this most comfortably by using various supports until they have the flexibility and strength to maintain this posture on their own. When sitting with proper posture, your ears will fall over your shoulders and your shoulders will fall over your hips. You should use the back of the chair to support your upper back. Your low back will be in a neutral position, just slightly arched. You may place a small pillow or folded towel at the base of your low back for support.  When working at a desk, create an environment that supports good, upright posture. Without extra support, muscles fatigue and lead to excessive strain on joints and other tissues. Keep these recommendations in mind: CHAIR:   A chair should be able to slide under your desk when your  back makes contact with the back of the chair. This allows you to work closely.  The chair's height should allow your eyes to be level with the upper part of your monitor and your hands to be slightly lower than your elbows. BODY POSITION  Your feet should make contact with the floor. If this is not possible, use a foot rest.  Keep your ears over your shoulders. This will reduce stress on your neck and low back. INCORRECT SITTING POSTURES   If you are feeling tired and unable to assume a healthy sitting posture, do not slouch or slump. This puts excessive strain on your back tissues, causing more damage and pain. Healthier options include:  Using more support, like a lumbar pillow.  Switching tasks to something that requires you to be upright or walking.  Talking a brief walk.  Lying down to rest in a neutral-spine position. PROLONGED STANDING  WHILE SLIGHTLY LEANING FORWARD  When completing a task that requires you to lean forward while standing in one place for a long time, place either foot up on a stationary 2-4 inch high object to help maintain the best posture. When both feet are on the ground, the low back tends to lose its slight inward curve. If this curve flattens (or becomes too large), then the back and your other joints will experience too much stress, fatigue more quickly and can cause pain.  CORRECT STANDING POSTURES Proper standing posture should be assumed with all daily activities, even if they only take a few moments, like when brushing your teeth. As in sitting, your ears should fall over your shoulders and your shoulders should fall over your hips. You should keep a slight tension in your abdominal muscles to brace your spine. Your tailbone should point down to the ground, not behind your body, resulting in an over-extended swayback posture.  INCORRECT STANDING POSTURES  Common incorrect standing postures include a forward head, locked knees and/or an excessive swayback. WALKING Walk with an upright posture. Your ears, shoulders and hips should all line-up. PROLONGED ACTIVITY IN A FLEXED POSITION When completing a task that requires you to bend forward at your waist or lean over a low surface, try to find a way to stabilize 3 of 4 of your limbs. You can place a hand or elbow on your thigh or rest a knee on the surface you are reaching across. This will provide you more stability so that your muscles do not fatigue as quickly. By keeping your knees relaxed, or slightly bent, you will also reduce stress across your low back. CORRECT LIFTING TECHNIQUES DO :   Assume a wide stance. This will provide you more stability and the opportunity to get as close as possible to the object which you are lifting.  Tense your abdominals to brace your spine; then bend at the knees and hips. Keeping your back locked in a neutral-spine  position, lift using your leg muscles. Lift with your legs, keeping your back straight.  Test the weight of unknown objects before attempting to lift them.  Try to keep your elbows locked down at your sides in order get the best strength from your shoulders when carrying an object.  Always ask for help when lifting heavy or awkward objects. INCORRECT LIFTING TECHNIQUES DO NOT:   Lock your knees when lifting, even if it is a small object.  Bend and twist. Pivot at your feet or move your feet when needing to change directions.  Assume that you  cannot safely pick up a paperclip without proper posture.   This information is not intended to replace advice given to you by your health care provider. Make sure you discuss any questions you have with your health care provider.   Document Released: 01/12/2005 Document Revised: 05/29/2014 Document Reviewed: 04/26/2008 [employer] Patient Education 2016 [place] K. Panosh [name]

## 2015-07-17 ENCOUNTER — Encounter: Payer: Self-pay | Admitting: Internal Medicine

## 2015-08-05 DIAGNOSIS — M9904 Segmental and somatic dysfunction of sacral region: Secondary | ICD-10-CM | POA: Diagnosis not present

## 2015-08-05 DIAGNOSIS — M9903 Segmental and somatic dysfunction of lumbar region: Secondary | ICD-10-CM | POA: Diagnosis not present

## 2015-08-05 DIAGNOSIS — M5417 Radiculopathy, lumbosacral region: Secondary | ICD-10-CM | POA: Diagnosis not present

## 2015-08-05 DIAGNOSIS — M5137 Other intervertebral disc degeneration, lumbosacral region: Secondary | ICD-10-CM | POA: Diagnosis not present

## 2015-08-05 DIAGNOSIS — M9902 Segmental and somatic dysfunction of thoracic region: Secondary | ICD-10-CM | POA: Diagnosis not present

## 2015-08-07 ENCOUNTER — Ambulatory Visit: Payer: Medicare Other | Admitting: Family Medicine

## 2015-08-07 DIAGNOSIS — M5137 Other intervertebral disc degeneration, lumbosacral region: Secondary | ICD-10-CM | POA: Diagnosis not present

## 2015-08-07 DIAGNOSIS — M5417 Radiculopathy, lumbosacral region: Secondary | ICD-10-CM | POA: Diagnosis not present

## 2015-08-07 DIAGNOSIS — M9903 Segmental and somatic dysfunction of lumbar region: Secondary | ICD-10-CM | POA: Diagnosis not present

## 2015-08-07 DIAGNOSIS — M9902 Segmental and somatic dysfunction of thoracic region: Secondary | ICD-10-CM | POA: Diagnosis not present

## 2015-08-07 DIAGNOSIS — M9904 Segmental and somatic dysfunction of sacral region: Secondary | ICD-10-CM | POA: Diagnosis not present

## 2015-08-09 DIAGNOSIS — M9904 Segmental and somatic dysfunction of sacral region: Secondary | ICD-10-CM | POA: Diagnosis not present

## 2015-08-09 DIAGNOSIS — M9903 Segmental and somatic dysfunction of lumbar region: Secondary | ICD-10-CM | POA: Diagnosis not present

## 2015-08-09 DIAGNOSIS — M5417 Radiculopathy, lumbosacral region: Secondary | ICD-10-CM | POA: Diagnosis not present

## 2015-08-09 DIAGNOSIS — M9902 Segmental and somatic dysfunction of thoracic region: Secondary | ICD-10-CM | POA: Diagnosis not present

## 2015-08-09 DIAGNOSIS — M5137 Other intervertebral disc degeneration, lumbosacral region: Secondary | ICD-10-CM | POA: Diagnosis not present

## 2015-08-12 DIAGNOSIS — M5417 Radiculopathy, lumbosacral region: Secondary | ICD-10-CM | POA: Diagnosis not present

## 2015-08-12 DIAGNOSIS — M9902 Segmental and somatic dysfunction of thoracic region: Secondary | ICD-10-CM | POA: Diagnosis not present

## 2015-08-12 DIAGNOSIS — M9904 Segmental and somatic dysfunction of sacral region: Secondary | ICD-10-CM | POA: Diagnosis not present

## 2015-08-12 DIAGNOSIS — M5137 Other intervertebral disc degeneration, lumbosacral region: Secondary | ICD-10-CM | POA: Diagnosis not present

## 2015-08-12 DIAGNOSIS — M9903 Segmental and somatic dysfunction of lumbar region: Secondary | ICD-10-CM | POA: Diagnosis not present

## 2015-08-14 DIAGNOSIS — M5417 Radiculopathy, lumbosacral region: Secondary | ICD-10-CM | POA: Diagnosis not present

## 2015-08-14 DIAGNOSIS — M9903 Segmental and somatic dysfunction of lumbar region: Secondary | ICD-10-CM | POA: Diagnosis not present

## 2015-08-14 DIAGNOSIS — M9902 Segmental and somatic dysfunction of thoracic region: Secondary | ICD-10-CM | POA: Diagnosis not present

## 2015-08-14 DIAGNOSIS — M9904 Segmental and somatic dysfunction of sacral region: Secondary | ICD-10-CM | POA: Diagnosis not present

## 2015-08-14 DIAGNOSIS — M5137 Other intervertebral disc degeneration, lumbosacral region: Secondary | ICD-10-CM | POA: Diagnosis not present

## 2015-08-16 DIAGNOSIS — M5417 Radiculopathy, lumbosacral region: Secondary | ICD-10-CM | POA: Diagnosis not present

## 2015-08-16 DIAGNOSIS — M5137 Other intervertebral disc degeneration, lumbosacral region: Secondary | ICD-10-CM | POA: Diagnosis not present

## 2015-08-16 DIAGNOSIS — M9903 Segmental and somatic dysfunction of lumbar region: Secondary | ICD-10-CM | POA: Diagnosis not present

## 2015-08-16 DIAGNOSIS — M9902 Segmental and somatic dysfunction of thoracic region: Secondary | ICD-10-CM | POA: Diagnosis not present

## 2015-08-16 DIAGNOSIS — M9904 Segmental and somatic dysfunction of sacral region: Secondary | ICD-10-CM | POA: Diagnosis not present

## 2015-09-02 DIAGNOSIS — M5137 Other intervertebral disc degeneration, lumbosacral region: Secondary | ICD-10-CM | POA: Diagnosis not present

## 2015-09-02 DIAGNOSIS — M9904 Segmental and somatic dysfunction of sacral region: Secondary | ICD-10-CM | POA: Diagnosis not present

## 2015-09-02 DIAGNOSIS — M5417 Radiculopathy, lumbosacral region: Secondary | ICD-10-CM | POA: Diagnosis not present

## 2015-09-02 DIAGNOSIS — M9902 Segmental and somatic dysfunction of thoracic region: Secondary | ICD-10-CM | POA: Diagnosis not present

## 2015-09-02 DIAGNOSIS — M9903 Segmental and somatic dysfunction of lumbar region: Secondary | ICD-10-CM | POA: Diagnosis not present

## 2015-09-05 DIAGNOSIS — M9903 Segmental and somatic dysfunction of lumbar region: Secondary | ICD-10-CM | POA: Diagnosis not present

## 2015-09-05 DIAGNOSIS — M9904 Segmental and somatic dysfunction of sacral region: Secondary | ICD-10-CM | POA: Diagnosis not present

## 2015-09-05 DIAGNOSIS — M5417 Radiculopathy, lumbosacral region: Secondary | ICD-10-CM | POA: Diagnosis not present

## 2015-09-05 DIAGNOSIS — M5137 Other intervertebral disc degeneration, lumbosacral region: Secondary | ICD-10-CM | POA: Diagnosis not present

## 2015-09-05 DIAGNOSIS — M9902 Segmental and somatic dysfunction of thoracic region: Secondary | ICD-10-CM | POA: Diagnosis not present

## 2015-09-09 DIAGNOSIS — M5417 Radiculopathy, lumbosacral region: Secondary | ICD-10-CM | POA: Diagnosis not present

## 2015-09-09 DIAGNOSIS — M5137 Other intervertebral disc degeneration, lumbosacral region: Secondary | ICD-10-CM | POA: Diagnosis not present

## 2015-09-09 DIAGNOSIS — M9904 Segmental and somatic dysfunction of sacral region: Secondary | ICD-10-CM | POA: Diagnosis not present

## 2015-09-09 DIAGNOSIS — M9902 Segmental and somatic dysfunction of thoracic region: Secondary | ICD-10-CM | POA: Diagnosis not present

## 2015-09-09 DIAGNOSIS — M9903 Segmental and somatic dysfunction of lumbar region: Secondary | ICD-10-CM | POA: Diagnosis not present

## 2015-09-11 DIAGNOSIS — M9903 Segmental and somatic dysfunction of lumbar region: Secondary | ICD-10-CM | POA: Diagnosis not present

## 2015-09-11 DIAGNOSIS — M5417 Radiculopathy, lumbosacral region: Secondary | ICD-10-CM | POA: Diagnosis not present

## 2015-09-11 DIAGNOSIS — M9904 Segmental and somatic dysfunction of sacral region: Secondary | ICD-10-CM | POA: Diagnosis not present

## 2015-09-11 DIAGNOSIS — M9902 Segmental and somatic dysfunction of thoracic region: Secondary | ICD-10-CM | POA: Diagnosis not present

## 2015-09-11 DIAGNOSIS — M5137 Other intervertebral disc degeneration, lumbosacral region: Secondary | ICD-10-CM | POA: Diagnosis not present

## 2015-09-16 DIAGNOSIS — M5137 Other intervertebral disc degeneration, lumbosacral region: Secondary | ICD-10-CM | POA: Diagnosis not present

## 2015-09-16 DIAGNOSIS — M9903 Segmental and somatic dysfunction of lumbar region: Secondary | ICD-10-CM | POA: Diagnosis not present

## 2015-09-16 DIAGNOSIS — M9904 Segmental and somatic dysfunction of sacral region: Secondary | ICD-10-CM | POA: Diagnosis not present

## 2015-09-16 DIAGNOSIS — M9902 Segmental and somatic dysfunction of thoracic region: Secondary | ICD-10-CM | POA: Diagnosis not present

## 2015-09-16 DIAGNOSIS — M5417 Radiculopathy, lumbosacral region: Secondary | ICD-10-CM | POA: Diagnosis not present

## 2015-09-25 DIAGNOSIS — M9902 Segmental and somatic dysfunction of thoracic region: Secondary | ICD-10-CM | POA: Diagnosis not present

## 2015-09-25 DIAGNOSIS — M9903 Segmental and somatic dysfunction of lumbar region: Secondary | ICD-10-CM | POA: Diagnosis not present

## 2015-09-25 DIAGNOSIS — M5417 Radiculopathy, lumbosacral region: Secondary | ICD-10-CM | POA: Diagnosis not present

## 2015-09-25 DIAGNOSIS — M5137 Other intervertebral disc degeneration, lumbosacral region: Secondary | ICD-10-CM | POA: Diagnosis not present

## 2015-09-25 DIAGNOSIS — M9904 Segmental and somatic dysfunction of sacral region: Secondary | ICD-10-CM | POA: Diagnosis not present

## 2015-10-09 DIAGNOSIS — M9903 Segmental and somatic dysfunction of lumbar region: Secondary | ICD-10-CM | POA: Diagnosis not present

## 2015-10-09 DIAGNOSIS — M5137 Other intervertebral disc degeneration, lumbosacral region: Secondary | ICD-10-CM | POA: Diagnosis not present

## 2015-10-09 DIAGNOSIS — M9904 Segmental and somatic dysfunction of sacral region: Secondary | ICD-10-CM | POA: Diagnosis not present

## 2015-10-09 DIAGNOSIS — M9902 Segmental and somatic dysfunction of thoracic region: Secondary | ICD-10-CM | POA: Diagnosis not present

## 2015-10-09 DIAGNOSIS — M5417 Radiculopathy, lumbosacral region: Secondary | ICD-10-CM | POA: Diagnosis not present

## 2015-10-28 DIAGNOSIS — H40003 Preglaucoma, unspecified, bilateral: Secondary | ICD-10-CM | POA: Diagnosis not present

## 2015-11-04 DIAGNOSIS — H25013 Cortical age-related cataract, bilateral: Secondary | ICD-10-CM | POA: Diagnosis not present

## 2015-11-04 DIAGNOSIS — H5203 Hypermetropia, bilateral: Secondary | ICD-10-CM | POA: Diagnosis not present

## 2015-11-06 DIAGNOSIS — M9903 Segmental and somatic dysfunction of lumbar region: Secondary | ICD-10-CM | POA: Diagnosis not present

## 2015-11-06 DIAGNOSIS — M9902 Segmental and somatic dysfunction of thoracic region: Secondary | ICD-10-CM | POA: Diagnosis not present

## 2015-11-06 DIAGNOSIS — M9904 Segmental and somatic dysfunction of sacral region: Secondary | ICD-10-CM | POA: Diagnosis not present

## 2015-11-06 DIAGNOSIS — M5417 Radiculopathy, lumbosacral region: Secondary | ICD-10-CM | POA: Diagnosis not present

## 2015-11-06 DIAGNOSIS — M5137 Other intervertebral disc degeneration, lumbosacral region: Secondary | ICD-10-CM | POA: Diagnosis not present

## 2015-11-11 ENCOUNTER — Ambulatory Visit (INDEPENDENT_AMBULATORY_CARE_PROVIDER_SITE_OTHER): Payer: Medicare Other | Admitting: Internal Medicine

## 2015-11-11 ENCOUNTER — Encounter: Payer: Self-pay | Admitting: Internal Medicine

## 2015-11-11 VITALS — BP 128/60 | Temp 98.2°F | Wt 136.4 lb

## 2015-11-11 DIAGNOSIS — J069 Acute upper respiratory infection, unspecified: Secondary | ICD-10-CM

## 2015-11-11 DIAGNOSIS — J329 Chronic sinusitis, unspecified: Secondary | ICD-10-CM | POA: Diagnosis not present

## 2015-11-11 DIAGNOSIS — L900 Lichen sclerosus et atrophicus: Secondary | ICD-10-CM | POA: Diagnosis not present

## 2015-11-11 NOTE — Patient Instructions (Signed)
This acts like a  Viral sinus infection and should resolve on its own in another week however if you get fever over 100.4 or  Worsening sever pain shortness of breath contact us for  advice  Or reevaluation    Sinusitis, Adult Sinusitis is redness, soreness, and inflammation of the paranasal sinuses. Paranasal sinuses are air pockets within the bones of your face. They are located beneath your eyes, in the middle of your forehead, and above your eyes. In healthy paranasal sinuses, mucus is able to drain out, and air is able to circulate through them by way of your nose. However, when your paranasal sinuses are inflamed, mucus and air can become trapped. This can allow bacteria and other germs to grow and cause infection. Sinusitis can develop quickly and last only a short time (acute) or continue over a long period (chronic). Sinusitis that lasts for more than 12 weeks is considered chronic. CAUSES Causes of sinusitis include:  Allergies.  Structural abnormalities, such as displacement of the cartilage that separates your nostrils (deviated septum), which can decrease the air flow through your nose and sinuses and affect sinus drainage.  Functional abnormalities, such as when the small hairs (cilia) that line your sinuses and help remove mucus do not work properly or are not present. SIGNS AND SYMPTOMS Symptoms of acute and chronic sinusitis are the same. The primary symptoms are pain and pressure around the affected sinuses. Other symptoms include:  Upper toothache.  Earache.  Headache.  Bad breath.  Decreased sense of smell and taste.  A cough, which worsens when you are lying flat.  Fatigue.  Fever.  Thick drainage from your nose, which often is green and may contain pus (purulent).  Swelling and warmth over the affected sinuses. DIAGNOSIS Your health care provider will perform a physical exam. During your exam, your health care provider may perform any of the following to  help determine if you have acute sinusitis or chronic sinusitis:  Look in your nose for signs of abnormal growths in your nostrils (nasal polyps).  Tap over the affected sinus to check for signs of infection.  View the inside of your sinuses using an imaging device that has a light attached (endoscope). If your health care provider suspects that you have chronic sinusitis, one or more of the following tests may be recommended:  Allergy tests.  Nasal culture. A sample of mucus is taken from your nose, sent to a lab, and screened for bacteria.  Nasal cytology. A sample of mucus is taken from your nose and examined by your health care provider to determine if your sinusitis is related to an allergy. TREATMENT Most cases of acute sinusitis are related to a viral infection and will resolve on their own within 10 days. Sometimes, medicines are prescribed to help relieve symptoms of both acute and chronic sinusitis. These may include pain medicines, decongestants, nasal steroid sprays, or saline sprays. However, for sinusitis related to a bacterial infection, your health care provider will prescribe antibiotic medicines. These are medicines that will help kill the bacteria causing the infection. Rarely, sinusitis is caused by a fungal infection. In these cases, your health care provider will prescribe antifungal medicine. For some cases of chronic sinusitis, surgery is needed. Generally, these are cases in which sinusitis recurs more than 3 times per year, despite other treatments. HOME CARE INSTRUCTIONS  Drink plenty of water. Water helps thin the mucus so your sinuses can drain more easily.  Use a humidifier.  Inhale  steam 3-4 times a day (for example, sit in the bathroom with the shower running).  Apply a warm, moist washcloth to your face 3-4 times a day, or as directed by your health care provider.  Use saline nasal sprays to help moisten and clean your sinuses.  Take medicines only as  directed by your health care provider.  If you were prescribed either an antibiotic or antifungal medicine, finish it all even if you start to feel better. SEEK IMMEDIATE MEDICAL CARE IF:  You have increasing pain or severe headaches.  You have nausea, vomiting, or drowsiness.  You have swelling around your face.  You have vision problems.  You have a stiff neck.  You have difficulty breathing.   This information is not intended to replace advice given to you by your health care provider. Make sure you discuss any questions you have with your health care provider.   Document Released: 01/12/2005 Document Revised: 02/02/2014 Document Reviewed: 01/27/2011 Elsevier Interactive Patient Education [employer].

## 2015-11-11 NOTE — Progress Notes (Signed)
Pre visit review using our clinic review tool, if applicable. No additional management support is needed unless otherwise documented below in the visit note.  Chief Complaint  Patient presents with  . Nasal Congestion    Started Last Thursday  . Sore Throat  . Sinus Pressure/Pain  . Low Grade Fever    HPI: Sheila Livingston 70 y.o.  sda  Onset 4  Days ago onset  Of  And 3 days fever st and now tight in face.   And husband insisted    To come   99 2 days ago .    Chills and ? Fever.  99.4 this am .    Now ok   takin otcs some  No cp sob  hemoptysuis  ROS: See pertinent positives and negatives per HPI. Derm retiored lichen sclerosis and given  Temovate? Will need either new derm  But not in network or gyne   Past Medical History:  Diagnosis Date  . Allergy   . Anal fissure   . Benign paroxysmal positional vertigo   . Endometriosis   . Hemorrhoids   . History of skin cancer   . Hyperlipidemia   . Hyperplastic colon polyp   . Hypertension   . IBS (irritable bowel syndrome)     Family History  Problem Relation Age of Onset  . Diabetes Mother   . Breast cancer Sister   . Colon polyps Sister   . Stroke Mother   . Diabetes Sister   . Colon cancer Neg Hx   . Colon polyps Sister     Social History   Social History  . Marital status: Widowed    Spouse name: N/A  . Number of children: N/A  . Years of education: N/A   Social History Main Topics  . Smoking status: Former Research scientist (life sciences)  . Smokeless tobacco: Never Used  . Alcohol use 0.0 oz/week     Comment: rare  . Drug use: No  . Sexual activity: Not Asked   Other Topics Concern  . None   Social History Narrative   Widowed   Remarried 2015 2016   [place] of 2   Dog and Cat   6-7 hours of sleep   Mom passed away 01-15-2010   Working [employer]  1 - 27  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    Outpatient Medications Prior to Visit  Medication Sig Dispense Refill    . aspirin 81 MG chewable tablet Chew 81 mg by mouth daily.      . Cholecalciferol (VITAMIN D3) 400 UNITS CAPS Take 1 capsule by mouth daily.    . clobetasol ointment (TEMOVATE) 0.05 % Apply topically 2 (two) times daily.      . Flaxseed, Linseed, (FLAX SEED OIL PO) Take by mouth.      . hydrocortisone 2.5 % cream Apply topically 2 (two) times daily.      Marland Kitchen ibuprofen (ADVIL,MOTRIN) 200 MG tablet Take 200 mg by mouth every 6 (six) hours as needed.      Marland Kitchen losartan (COZAAR) 50 MG tablet Take 1 tablet (50 mg total) by mouth daily. 90 tablet 1  . MULTIPLE VITAMIN PO Take by mouth.      . Omega-3 Fatty Acids (FISH OIL PO) Take by mouth.    . pseudoephedrine (SUDAFED) 30 MG tablet Take 30 mg by mouth every 4 (four) hours as needed.      Marland Kitchen  valACYclovir (VALTREX) 1000 MG tablet Take 2 tablets (2,000 mg total) by mouth 2 (two) times daily. As needed for cold sores 30 tablet 3  . desonide (DESOWEN) 0.05 % ointment Apply 1 application topically 2 (two) times daily.      . pramoxine-hydrocortisone (ANALPRAM HC) cream Apply topically 3 (three) times daily. 30 g 2   No facility-administered medications prior to visit.      EXAM:  BP 128/60 (BP Location: Right Arm, Patient Position: Sitting, Cuff Size: Normal)   Temp 98.2 F (36.8 C) (Oral)   Wt 136 lb 6.4 oz (61.9 kg)   BMI 23.41 kg/m   Body mass index is 23.41 kg/m. WDWN in NAD  quiet respirations; moderaltycongested  somewhat hoarse. Non toxic . HEENT: Normocephalic ;atraumatic , Eyes;  PERRL, EOMs  Full, lids and conjunctiva clear,,Ears: no deformities, canals nl, TM landmarks normal, Nose: no deformity or discharge but congested;face minimally tender Mouth : OP clear without lesion or edema . Neck: Supple without adenopathy or masses or bruits Chest:  Clear to A&P without wheezes rales or rhonchi CV:  S1-S2 no gallops or murmurs peripheral perfusion is normal Skin :nl perfusion and no acute rashes  PSYCH: pleasant and cooperative, no obvious  depression or anxiety  ASSESSMENT AND PLAN:  Discussed the following assessment and plan:  Sinusitis, unspecified chronicity, unspecified location - prob viral   expectant management and fu  fo rintervention  URI, acute  Anogenital lichen sclerosus - rx dr Tonia Brooms since retired can get provider in network gyne or derm  Disc   risk benefit of  Antibiotic usage -Patient advised to return or notify health care team  if symptoms worsen ,persist or new concerns arise.  Patient Instructions  This acts like a  Viral sinus infection and should resolve on its own in another week however if you get fever over 100.4 or  Worsening sever pain shortness of breath contact us for  advice  Or reevaluation    Sinusitis, Adult Sinusitis is redness, soreness, and inflammation of the paranasal sinuses. Paranasal sinuses are air pockets within the bones of your face. They are located beneath your eyes, in the middle of your forehead, and above your eyes. In healthy paranasal sinuses, mucus is able to drain out, and air is able to circulate through them by way of your nose. However, when your paranasal sinuses are inflamed, mucus and air can become trapped. This can allow bacteria and other germs to grow and cause infection. Sinusitis can develop quickly and last only a short time (acute) or continue over a long period (chronic). Sinusitis that lasts for more than 12 weeks is considered chronic. CAUSES Causes of sinusitis include:  Allergies.  Structural abnormalities, such as displacement of the cartilage that separates your nostrils (deviated septum), which can decrease the air flow through your nose and sinuses and affect sinus drainage.  Functional abnormalities, such as when the small hairs (cilia) that line your sinuses and help remove mucus do not work properly or are not present. SIGNS AND SYMPTOMS Symptoms of acute and chronic sinusitis are the same. The primary symptoms are pain and pressure around  the affected sinuses. Other symptoms include:  Upper toothache.  Earache.  Headache.  Bad breath.  Decreased sense of smell and taste.  A cough, which worsens when you are lying flat.  Fatigue.  Fever.  Thick drainage from your nose, which often is green and may contain pus (purulent).  Swelling and warmth over the affected sinuses.  DIAGNOSIS Your health care provider will perform a physical exam. During your exam, your health care provider may perform any of the following to help determine if you have acute sinusitis or chronic sinusitis:  Look in your nose for signs of abnormal growths in your nostrils (nasal polyps).  Tap over the affected sinus to check for signs of infection.  View the inside of your sinuses using an imaging device that has a light attached (endoscope). If your health care provider suspects that you have chronic sinusitis, one or more of the following tests may be recommended:  Allergy tests.  Nasal culture. A sample of mucus is taken from your nose, sent to a lab, and screened for bacteria.  Nasal cytology. A sample of mucus is taken from your nose and examined by your health care provider to determine if your sinusitis is related to an allergy. TREATMENT Most cases of acute sinusitis are related to a viral infection and will resolve on their own within 10 days. Sometimes, medicines are prescribed to help relieve symptoms of both acute and chronic sinusitis. These may include pain medicines, decongestants, nasal steroid sprays, or saline sprays. However, for sinusitis related to a bacterial infection, your health care provider will prescribe antibiotic medicines. These are medicines that will help kill the bacteria causing the infection. Rarely, sinusitis is caused by a fungal infection. In these cases, your health care provider will prescribe antifungal medicine. For some cases of chronic sinusitis, surgery is needed. Generally, these are cases in which  sinusitis recurs more than 3 times per year, despite other treatments. HOME CARE INSTRUCTIONS  Drink plenty of water. Water helps thin the mucus so your sinuses can drain more easily.  Use a humidifier.  Inhale steam 3-4 times a day (for example, sit in the bathroom with the shower running).  Apply a warm, moist washcloth to your face 3-4 times a day, or as directed by your health care provider.  Use saline nasal sprays to help moisten and clean your sinuses.  Take medicines only as directed by your health care provider.  If you were prescribed either an antibiotic or antifungal medicine, finish it all even if you start to feel better. SEEK IMMEDIATE MEDICAL CARE IF:  You have increasing pain or severe headaches.  You have nausea, vomiting, or drowsiness.  You have swelling around your face.  You have vision problems.  You have a stiff neck.  You have difficulty breathing.   This information is not intended to replace advice given to you by your health care provider. Make sure you discuss any questions you have with your health care provider.   Document Released: 01/12/2005 Document Revised: 02/02/2014 Document Reviewed: 01/27/2011 Elsevier Interactive Patient Education 2016 [place] K. Jachai Okazaki [name]

## 2015-11-16 ENCOUNTER — Other Ambulatory Visit: Payer: Self-pay | Admitting: Internal Medicine

## 2015-11-19 NOTE — Telephone Encounter (Signed)
Sent to the pharmacy by e-scribe for 90 days.  Pt has yearly wellness scheduled for 02/12/16

## 2015-11-27 DIAGNOSIS — H179 Unspecified corneal scar and opacity: Secondary | ICD-10-CM | POA: Diagnosis not present

## 2015-12-13 ENCOUNTER — Encounter: Payer: PRIVATE HEALTH INSURANCE | Admitting: Internal Medicine

## 2015-12-30 DIAGNOSIS — M9903 Segmental and somatic dysfunction of lumbar region: Secondary | ICD-10-CM | POA: Diagnosis not present

## 2015-12-30 DIAGNOSIS — M9904 Segmental and somatic dysfunction of sacral region: Secondary | ICD-10-CM | POA: Diagnosis not present

## 2015-12-30 DIAGNOSIS — M9902 Segmental and somatic dysfunction of thoracic region: Secondary | ICD-10-CM | POA: Diagnosis not present

## 2015-12-30 DIAGNOSIS — M5137 Other intervertebral disc degeneration, lumbosacral region: Secondary | ICD-10-CM | POA: Diagnosis not present

## 2015-12-30 DIAGNOSIS — M5417 Radiculopathy, lumbosacral region: Secondary | ICD-10-CM | POA: Diagnosis not present

## 2016-01-07 DIAGNOSIS — L900 Lichen sclerosus et atrophicus: Secondary | ICD-10-CM | POA: Diagnosis not present

## 2016-01-21 ENCOUNTER — Other Ambulatory Visit: Payer: Self-pay | Admitting: Internal Medicine

## 2016-01-21 NOTE — Telephone Encounter (Signed)
Sent to the pharmacy by e-scribe for 90 days.  Pt has upcoming cpx. 

## 2016-01-30 DIAGNOSIS — H40003 Preglaucoma, unspecified, bilateral: Secondary | ICD-10-CM | POA: Diagnosis not present

## 2016-02-11 NOTE — Progress Notes (Deleted)
No chief complaint on file.   HPI: Sheila Livingston 71 y.o. comes in today for Preventive Medicare wellness visit .Since last visit.  Health Maintenance  Topic Date Due  . Hepatitis C Screening  18-May-1945  . TETANUS/TDAP  01/27/2015  . MAMMOGRAM  03/26/2017  . COLONOSCOPY  08/11/2022  . INFLUENZA VACCINE  Addressed  . DEXA SCAN  Completed  . ZOSTAVAX  Completed  . PNA vac Low Risk Adult  Completed   Health Maintenance Review LIFESTYLE:  TAD Sugar beverages: Sleep:    MEDICARE DOCUMENT QUESTIONS  TO SCAN     Hearing:   Vision:  No limitations at present . Last eye check UTD  Safety:  Has smoke detector and wears seat belts.  No firearms. No excess sun exposure. Sees dentist regularly.  Falls:   Advance directive :  Reviewed  Has one.  Memory: Felt to be good  , no concern from her or her family.  Depression: No anhedonia unusual crying or depressive symptoms  Nutrition: Eats well balanced diet; adequate calcium and vitamin D. No swallowing chewing problems.  Injury: no major injuries in the last six months.  Other healthcare providers:  Reviewed today .  Social:  Lives with spouse married. No pets.   Preventive parameters: up-to-date  Reviewed   ADLS:   There are no problems or need for assistance  driving, feeding, obtaining food, dressing, toileting and bathing, managing money using phone. She is independent.  EXERCISE/ HABITS  Per week   No tobacco    etoh   ROS:  GEN/ HEENT: No fever, significant weight changes sweats headaches vision problems hearing changes, CV/ PULM; No chest pain shortness of breath cough, syncope,edema  change in exercise tolerance. GI /GU: No adominal pain, vomiting, change in bowel habits. No blood in the stool. No significant GU symptoms. SKIN/HEME: ,no acute skin rashes suspicious lesions or bleeding. No lymphadenopathy, nodules, masses.  NEURO/ PSYCH:  No neurologic signs such as weakness numbness. No depression  anxiety. IMM/ Allergy: No unusual infections.  Allergy .   REST of 12 system review negative except as per HPI   Past Medical History:  Diagnosis Date  . Allergy   . Anal fissure   . Benign paroxysmal positional vertigo   . Endometriosis   . Hemorrhoids   . History of skin cancer   . Hyperlipidemia   . Hyperplastic colon polyp   . Hypertension   . IBS (irritable bowel syndrome)     Family History  Problem Relation Age of Onset  . Diabetes Mother   . Breast cancer Sister   . Colon polyps Sister   . Stroke Mother   . Diabetes Sister   . Colon cancer Neg Hx   . Colon polyps Sister     Social History   Social History  . Marital status: Widowed    Spouse name: N/A  . Number of children: N/A  . Years of education: N/A   Social History Main Topics  . Smoking status: Former Research scientist (life sciences)  . Smokeless tobacco: Never Used  . Alcohol use 0.0 oz/week     Comment: rare  . Drug use: No  . Sexual activity: Not on file   Other Topics Concern  . Not on file   Social History Narrative   Widowed   Remarried 2015 2016   [facility] of [facility]   Dog and Cat   6-7 hours of sleep   Mom passed away 03-Jan-2010   Working [employer]  [employer]ans  65 - 12  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    Outpatient Encounter Prescriptions as of 02/12/2016  Medication Sig  . aspirin 81 MG chewable tablet Chew 81 mg by mouth daily.    . Cholecalciferol (VITAMIN D3) 400 UNITS CAPS Take 1 capsule by mouth daily.  . clobetasol ointment (TEMOVATE) 0.05 % Apply topically 2 (two) times daily.    . Flaxseed, Linseed, (FLAX SEED OIL PO) Take by mouth.    . hydrocortisone 2.5 % cream Apply topically 2 (two) times daily.    Marland Kitchen ibuprofen (ADVIL,MOTRIN) 200 MG tablet Take 200 mg by mouth every 6 (six) hours as needed.    Marland Kitchen losartan (COZAAR) 50 MG tablet TAKE 1 TABLET BY MOUTH  DAILY  . MULTIPLE VITAMIN PO Take by mouth.    . Omega-3 Fatty Acids (FISH OIL PO) Take by  mouth.  . pseudoephedrine (SUDAFED) 30 MG tablet Take 30 mg by mouth every 4 (four) hours as needed.    . valACYclovir (VALTREX) 1000 MG tablet Take 2 tablets (2,000 mg total) by mouth 2 (two) times daily. As needed for cold sores   No facility-administered encounter medications on file as of 02/12/2016.     EXAM:  There were no vitals taken for this visit.  There is no height or weight on file to calculate BMI.  Physical Exam: Vital signs reviewed [id number] is a well-developed well-nourished alert cooperative   who appears stated age in no acute distress.  HEENT: normocephalic atraumatic , Eyes: PERRL EOM's full, conjunctiva clear, Nares: paten,t no deformity discharge or tenderness., Ears: no deformity EAC's clear TMs with normal landmarks. Mouth: clear OP, no lesions, edema.  Moist mucous membranes. Dentition in adequate repair. NECK: supple without masses, thyromegaly or bruits. CHEST/PULM:  Clear to auscultation and percussion breath sounds equal no wheeze , rales or rhonchi. No chest [place] deformities or tenderness. CV: PMI is nondisplaced, S1 S2 no gallops, murmurs, rubs. Peripheral pulses are full without delay.No JVD .  ABDOMEN: Bowel sounds normal nontender  No guard or rebound, no hepato splenomegal no CVA tenderness.   Extremtities:  No clubbing cyanosis or edema, no acute joint swelling or redness no focal atrophy NEURO:  Oriented x3, cranial nerves 3-12 appear to be intact, no obvious focal weakness,gait within normal limits no abnormal reflexes or asymmetrical SKIN: No acute rashes normal turgor, color, no bruising or petechiae. PSYCH: Oriented, good eye contact, no obvious depression anxiety, cognition and judgment appear normal. LN: no cervical axillary inguinal adenopathy No noted deficits in memory, attention, and speech.   Lab Results  Component Value Date   WBC 5.9 10/27/2013   HGB 13.6 10/27/2013   HCT 39.9 10/27/2013   PLT 215.0 10/27/2013   GLUCOSE 92  12/12/2014   CHOL 244 (H) 12/12/2014   TRIG 94.0 12/12/2014   HDL 78.40 12/12/2014   LDLDIRECT 144.5 01/11/2013   LDLCALC 146 (H) 12/12/2014   ALT 25 10/27/2013   AST 24 10/27/2013   NA 140 12/12/2014   K 4.1 12/12/2014   CL 104 12/12/2014   CREATININE 0.79 12/12/2014   BUN 14 12/12/2014   CO2 28 12/12/2014   TSH 1.18 12/12/2014    ASSESSMENT AND PLAN:  Discussed the following assessment and plan:  Medicare annual wellness visit, subsequent  Visit for preventive health examination  Hyperlipidemia, unspecified hyperlipidemia type  Essential hypertension  Patient Care Team:  Burnis Medin, MD as PCP - General Delila Pereyra, MD (Obstetrics and Gynecology) Sydnee Cabal, MD (Orthopedic Surgery) Crista Luria, MD as Attending Physician (Dermatology) amy Waggoner   There are no Patient Instructions on file for this visit.  Standley Brooking. Guadalupe Kerekes [name]

## 2016-02-12 ENCOUNTER — Encounter: Payer: Medicare Other | Admitting: Internal Medicine

## 2016-03-10 ENCOUNTER — Encounter: Payer: Medicare Other | Admitting: Internal Medicine

## 2016-03-18 ENCOUNTER — Encounter: Payer: Medicare Other | Admitting: Internal Medicine

## 2016-04-09 LAB — HM MAMMOGRAPHY

## 2016-04-14 ENCOUNTER — Encounter: Payer: Self-pay | Admitting: Family Medicine

## 2016-05-04 ENCOUNTER — Other Ambulatory Visit: Payer: Self-pay | Admitting: Orthopedic Surgery

## 2016-05-06 ENCOUNTER — Encounter (HOSPITAL_BASED_OUTPATIENT_CLINIC_OR_DEPARTMENT_OTHER): Payer: Self-pay | Admitting: *Deleted

## 2016-05-06 ENCOUNTER — Other Ambulatory Visit: Payer: Self-pay | Admitting: Orthopedic Surgery

## 2016-05-07 ENCOUNTER — Encounter (HOSPITAL_BASED_OUTPATIENT_CLINIC_OR_DEPARTMENT_OTHER)
Admission: RE | Admit: 2016-05-07 | Discharge: 2016-05-07 | Disposition: A | Discharge status: Home / Self Care | Payer: Medicare Other | Source: Ambulatory Visit | Attending: Orthopedic Surgery | Admitting: Orthopedic Surgery

## 2016-05-07 DIAGNOSIS — H8110 Benign paroxysmal vertigo, unspecified ear: Secondary | ICD-10-CM | POA: Diagnosis not present

## 2016-05-07 DIAGNOSIS — Z8371 Family history of colonic polyps: Secondary | ICD-10-CM | POA: Diagnosis not present

## 2016-05-07 DIAGNOSIS — Z90710 Acquired absence of both cervix and uterus: Secondary | ICD-10-CM | POA: Diagnosis not present

## 2016-05-07 DIAGNOSIS — K589 Irritable bowel syndrome without diarrhea: Secondary | ICD-10-CM | POA: Diagnosis not present

## 2016-05-07 DIAGNOSIS — E785 Hyperlipidemia, unspecified: Secondary | ICD-10-CM | POA: Diagnosis not present

## 2016-05-07 DIAGNOSIS — Z833 Family history of diabetes mellitus: Secondary | ICD-10-CM | POA: Diagnosis not present

## 2016-05-07 DIAGNOSIS — C4361 Malignant melanoma of right upper limb, including shoulder: Secondary | ICD-10-CM | POA: Diagnosis not present

## 2016-05-07 DIAGNOSIS — Z87891 Personal history of nicotine dependence: Secondary | ICD-10-CM | POA: Diagnosis not present

## 2016-05-07 DIAGNOSIS — M19041 Primary osteoarthritis, right hand: Secondary | ICD-10-CM | POA: Diagnosis not present

## 2016-05-07 DIAGNOSIS — Z803 Family history of malignant neoplasm of breast: Secondary | ICD-10-CM | POA: Diagnosis not present

## 2016-05-07 DIAGNOSIS — Z85828 Personal history of other malignant neoplasm of skin: Secondary | ICD-10-CM | POA: Diagnosis not present

## 2016-05-07 DIAGNOSIS — Z8719 Personal history of other diseases of the digestive system: Secondary | ICD-10-CM | POA: Diagnosis not present

## 2016-05-07 DIAGNOSIS — I10 Essential (primary) hypertension: Secondary | ICD-10-CM | POA: Diagnosis not present

## 2016-05-07 DIAGNOSIS — Z888 Allergy status to other drugs, medicaments and biological substances status: Secondary | ICD-10-CM | POA: Diagnosis not present

## 2016-05-07 DIAGNOSIS — M19042 Primary osteoarthritis, left hand: Secondary | ICD-10-CM | POA: Diagnosis not present

## 2016-05-07 DIAGNOSIS — Z823 Family history of stroke: Secondary | ICD-10-CM | POA: Diagnosis not present

## 2016-05-07 DIAGNOSIS — L608 Other nail disorders: Secondary | ICD-10-CM | POA: Diagnosis present

## 2016-05-12 ENCOUNTER — Ambulatory Visit (HOSPITAL_BASED_OUTPATIENT_CLINIC_OR_DEPARTMENT_OTHER): Payer: Medicare Other | Admitting: Certified Registered"

## 2016-05-12 ENCOUNTER — Encounter (HOSPITAL_BASED_OUTPATIENT_CLINIC_OR_DEPARTMENT_OTHER): Payer: Self-pay | Admitting: Certified Registered"

## 2016-05-12 ENCOUNTER — Ambulatory Visit (HOSPITAL_BASED_OUTPATIENT_CLINIC_OR_DEPARTMENT_OTHER)
Admission: RE | Admit: 2016-05-12 | Discharge: 2016-05-12 | Disposition: A | Discharge status: Home / Self Care | Payer: Medicare Other | Source: Ambulatory Visit | Attending: Orthopedic Surgery | Admitting: Orthopedic Surgery

## 2016-05-12 ENCOUNTER — Encounter (HOSPITAL_BASED_OUTPATIENT_CLINIC_OR_DEPARTMENT_OTHER)
Admission: RE | Disposition: A | Discharge status: Home / Self Care | Payer: Self-pay | Source: Ambulatory Visit | Attending: Orthopedic Surgery

## 2016-05-12 DIAGNOSIS — M19041 Primary osteoarthritis, right hand: Secondary | ICD-10-CM | POA: Insufficient documentation

## 2016-05-12 DIAGNOSIS — E785 Hyperlipidemia, unspecified: Secondary | ICD-10-CM | POA: Diagnosis not present

## 2016-05-12 DIAGNOSIS — Z803 Family history of malignant neoplasm of breast: Secondary | ICD-10-CM | POA: Insufficient documentation

## 2016-05-12 DIAGNOSIS — C4361 Malignant melanoma of right upper limb, including shoulder: Secondary | ICD-10-CM | POA: Diagnosis not present

## 2016-05-12 DIAGNOSIS — I10 Essential (primary) hypertension: Secondary | ICD-10-CM | POA: Diagnosis not present

## 2016-05-12 DIAGNOSIS — Z833 Family history of diabetes mellitus: Secondary | ICD-10-CM | POA: Insufficient documentation

## 2016-05-12 DIAGNOSIS — Z823 Family history of stroke: Secondary | ICD-10-CM | POA: Insufficient documentation

## 2016-05-12 DIAGNOSIS — H8110 Benign paroxysmal vertigo, unspecified ear: Secondary | ICD-10-CM | POA: Insufficient documentation

## 2016-05-12 DIAGNOSIS — M19042 Primary osteoarthritis, left hand: Secondary | ICD-10-CM | POA: Insufficient documentation

## 2016-05-12 DIAGNOSIS — Z87891 Personal history of nicotine dependence: Secondary | ICD-10-CM | POA: Insufficient documentation

## 2016-05-12 DIAGNOSIS — Z90710 Acquired absence of both cervix and uterus: Secondary | ICD-10-CM | POA: Insufficient documentation

## 2016-05-12 DIAGNOSIS — Z888 Allergy status to other drugs, medicaments and biological substances status: Secondary | ICD-10-CM | POA: Insufficient documentation

## 2016-05-12 DIAGNOSIS — Z8371 Family history of colonic polyps: Secondary | ICD-10-CM | POA: Insufficient documentation

## 2016-05-12 DIAGNOSIS — Z85828 Personal history of other malignant neoplasm of skin: Secondary | ICD-10-CM | POA: Insufficient documentation

## 2016-05-12 DIAGNOSIS — K589 Irritable bowel syndrome without diarrhea: Secondary | ICD-10-CM | POA: Insufficient documentation

## 2016-05-12 DIAGNOSIS — Z8719 Personal history of other diseases of the digestive system: Secondary | ICD-10-CM | POA: Insufficient documentation

## 2016-05-12 HISTORY — DX: Adverse effect of unspecified anesthetic, initial encounter: T41.45XA

## 2016-05-12 HISTORY — DX: Other complications of anesthesia, initial encounter: T88.59XA

## 2016-05-12 HISTORY — DX: Nausea with vomiting, unspecified: R11.2

## 2016-05-12 HISTORY — DX: Other specified postprocedural states: Z98.890

## 2016-05-12 HISTORY — PX: SKIN FULL THICKNESS GRAFT: SHX442

## 2016-05-12 HISTORY — PX: NAILBED REPAIR: SHX5028

## 2016-05-12 HISTORY — DX: Unspecified osteoarthritis, unspecified site: M19.90

## 2016-05-12 SURGERY — REPAIR, NAIL BED
Anesthesia: Regional | Site: Thumb | Laterality: Right

## 2016-05-12 MED ORDER — MEPERIDINE HCL 25 MG/ML IJ SOLN: 6.2500 mg | INTRAMUSCULAR | Status: DC | PRN

## 2016-05-12 MED ORDER — FENTANYL CITRATE (PF) 100 MCG/2ML IJ SOLN
INTRAMUSCULAR | Status: DC | PRN
  Administered 2016-05-12: 50 ug via INTRAVENOUS

## 2016-05-12 MED ORDER — PROPOFOL 500 MG/50ML IV EMUL
INTRAVENOUS | Status: DC | PRN
  Administered 2016-05-12: 50 ug/kg/min via INTRAVENOUS

## 2016-05-12 MED ORDER — HYDROCODONE-ACETAMINOPHEN 5-325 MG PO TABS: 1.0000 | ORAL_TABLET | Freq: Four times a day (QID) | ORAL | 0 refills | Status: DC | PRN

## 2016-05-12 MED ORDER — OXYCODONE HCL 5 MG/5ML PO SOLN: 5.0000 mg | Freq: Once | ORAL | Status: DC | PRN

## 2016-05-12 MED ORDER — BUPIVACAINE HCL (PF) 0.5 % IJ SOLN
INTRAMUSCULAR | Status: DC | PRN
  Administered 2016-05-12: 5 mL

## 2016-05-12 MED ORDER — FENTANYL CITRATE (PF) 100 MCG/2ML IJ SOLN
INTRAMUSCULAR | Status: AC
  Filled 2016-05-12: qty 2

## 2016-05-12 MED ORDER — CEFAZOLIN SODIUM-DEXTROSE 2-4 GM/100ML-% IV SOLN
INTRAVENOUS | Status: AC
  Filled 2016-05-12: qty 100

## 2016-05-12 MED ORDER — CEFAZOLIN SODIUM-DEXTROSE 2-4 GM/100ML-% IV SOLN
2.0000 g | INTRAVENOUS | Status: AC
  Administered 2016-05-12: 2 g via INTRAVENOUS

## 2016-05-12 MED ORDER — FENTANYL CITRATE (PF) 100 MCG/2ML IJ SOLN: 25.0000 ug | INTRAMUSCULAR | Status: DC | PRN

## 2016-05-12 MED ORDER — OXYCODONE HCL 5 MG PO TABS: 5.0000 mg | ORAL_TABLET | Freq: Once | ORAL | Status: DC | PRN

## 2016-05-12 MED ORDER — CHLORHEXIDINE GLUCONATE 4 % EX LIQD: 60.0000 mL | Freq: Once | CUTANEOUS | Status: DC

## 2016-05-12 MED ORDER — LIDOCAINE HCL (PF) 0.5 % IJ SOLN
INTRAMUSCULAR | Status: DC | PRN
  Administered 2016-05-12: 50 mL via INTRAVENOUS

## 2016-05-12 MED ORDER — ONDANSETRON HCL 4 MG/2ML IJ SOLN
INTRAMUSCULAR | Status: DC | PRN
  Administered 2016-05-12: 4 mg via INTRAVENOUS

## 2016-05-12 MED ORDER — PROMETHAZINE HCL 25 MG/ML IJ SOLN: 6.2500 mg | INTRAMUSCULAR | Status: DC | PRN

## 2016-05-12 MED ORDER — LACTATED RINGERS IV SOLN
INTRAVENOUS | Status: DC
  Administered 2016-05-12: 12:00:00 via INTRAVENOUS

## 2016-05-12 SURGICAL SUPPLY — 48 items
BLADE MINI RND TIP GREEN BEAV (BLADE) ×3 IMPLANT
BLADE SURG 15 STRL LF DISP TIS (BLADE) ×2 IMPLANT
BLADE SURG 15 STRL SS (BLADE) ×1
BNDG COHESIVE 1X5 TAN STRL LF (GAUZE/BANDAGES/DRESSINGS) IMPLANT
BNDG COHESIVE 3X5 TAN STRL LF (GAUZE/BANDAGES/DRESSINGS) IMPLANT
BNDG ESMARK 4X9 LF (GAUZE/BANDAGES/DRESSINGS) ×3 IMPLANT
BNDG GAUZE ELAST 4 BULKY (GAUZE/BANDAGES/DRESSINGS) IMPLANT
CHLORAPREP W/TINT 26ML (MISCELLANEOUS) ×3 IMPLANT
CORDS BIPOLAR (ELECTRODE) ×3 IMPLANT
COVER BACK TABLE 60X90IN (DRAPES) ×3 IMPLANT
COVER MAYO STAND STRL (DRAPES) ×3 IMPLANT
CUFF TOURNIQUET SINGLE 18IN (TOURNIQUET CUFF) ×3 IMPLANT
DEPRESSOR TONGUE BLADE STERILE (MISCELLANEOUS) IMPLANT
DRAPE EXTREMITY T 121X128X90 (DRAPE) ×3 IMPLANT
DRAPE SURG 17X23 STRL (DRAPES) ×3 IMPLANT
GAUZE SPONGE 4X4 12PLY STRL (GAUZE/BANDAGES/DRESSINGS) ×3 IMPLANT
GAUZE XEROFORM 1X8 LF (GAUZE/BANDAGES/DRESSINGS) ×3 IMPLANT
GLOVE BIOGEL PI IND STRL 7.0 (GLOVE) ×4 IMPLANT
GLOVE BIOGEL PI IND STRL 7.5 (GLOVE) ×2 IMPLANT
GLOVE BIOGEL PI IND STRL 8.5 (GLOVE) ×2 IMPLANT
GLOVE BIOGEL PI INDICATOR 7.0 (GLOVE) ×2
GLOVE BIOGEL PI INDICATOR 7.5 (GLOVE) ×1
GLOVE BIOGEL PI INDICATOR 8.5 (GLOVE) ×1
GLOVE SURG ORTHO 8.0 STRL STRW (GLOVE) ×3 IMPLANT
GLOVE SURG SS PI 7.0 STRL IVOR (GLOVE) ×6 IMPLANT
GOWN STRL REUS W/ TWL LRG LVL3 (GOWN DISPOSABLE) ×2 IMPLANT
GOWN STRL REUS W/TWL LRG LVL3 (GOWN DISPOSABLE) ×1
GOWN STRL REUS W/TWL XL LVL3 (GOWN DISPOSABLE) ×3 IMPLANT
NEEDLE PRECISIONGLIDE 27X1.5 (NEEDLE) ×3 IMPLANT
NS IRRIG 1000ML POUR BTL (IV SOLUTION) ×3 IMPLANT
PACK BASIN DAY SURGERY FS (CUSTOM PROCEDURE TRAY) ×3 IMPLANT
PAD CAST 3X4 CTTN HI CHSV (CAST SUPPLIES) IMPLANT
PADDING CAST ABS 4INX4YD NS (CAST SUPPLIES) ×1
PADDING CAST ABS COTTON 4X4 ST (CAST SUPPLIES) ×2 IMPLANT
PADDING CAST COTTON 3X4 STRL (CAST SUPPLIES)
SPLINT FINGER 4.25 BULB 911906 (SOFTGOODS) ×3 IMPLANT
SPLINT PLASTER CAST XFAST 3X15 (CAST SUPPLIES) IMPLANT
SPLINT PLASTER XTRA FASTSET 3X (CAST SUPPLIES)
STAPLER VISISTAT 35W (STAPLE) IMPLANT
STOCKINETTE 4X48 STRL (DRAPES) ×3 IMPLANT
SUT CHROMIC 5 0 P 3 (SUTURE) ×3 IMPLANT
SUT CHROMIC 6 0 G 1 (SUTURE) ×3 IMPLANT
SUT ETHILON 4 0 PS 2 18 (SUTURE) ×3 IMPLANT
SUT VIC AB 4-0 P2 18 (SUTURE) IMPLANT
SYR BULB 3OZ (MISCELLANEOUS) ×3 IMPLANT
SYR CONTROL 10ML LL (SYRINGE) ×3 IMPLANT
TOWEL OR 17X24 6PK STRL BLUE (TOWEL DISPOSABLE) ×6 IMPLANT
UNDERPAD 30X30 (UNDERPADS AND DIAPERS) ×3 IMPLANT

## 2016-05-12 NOTE — Anesthesia Preprocedure Evaluation (Signed)
Anesthesia Evaluation  Patient identified by MRN, date of birth, ID band Patient awake    Reviewed: Allergy & Precautions, NPO status , Patient's Chart, lab work & pertinent test results  History of Anesthesia Complications (+) PONV and history of anesthetic complications  Airway Mallampati: II  TM Distance: >3 FB Neck ROM: Full    Dental no notable dental hx.    Pulmonary neg pulmonary ROS, former smoker,    Pulmonary exam normal breath sounds clear to auscultation       Cardiovascular hypertension, Pt. on medications Normal cardiovascular exam Rhythm:Regular Rate:Normal     Neuro/Psych negative neurological ROS  negative psych ROS   GI/Hepatic negative GI ROS, Neg liver ROS,   Endo/Other  negative endocrine ROS  Renal/GU negative Renal ROS     Musculoskeletal negative musculoskeletal ROS (+)   Abdominal   Peds  Hematology negative hematology ROS (+)   Anesthesia Other Findings   Reproductive/Obstetrics negative OB ROS                            Anesthesia Physical Anesthesia Plan  ASA: II  Anesthesia Plan: Bier Block   Post-op Pain Management:    Induction: Intravenous  Airway Management Planned:   Additional Equipment:   Intra-op Plan:   Post-operative Plan:   Informed Consent: I have reviewed the patients History and Physical, chart, labs and discussed the procedure including the risks, benefits and alternatives for the proposed anesthesia with the patient or authorized representative who has indicated his/her understanding and acceptance.   Dental advisory given  Plan Discussed with: CRNA  Anesthesia Plan Comments:        Anesthesia Quick Evaluation

## 2016-05-12 NOTE — Discharge Instructions (Addendum)

## 2016-05-12 NOTE — H&P (Signed)
Sheila Livingston is an 71 y.o. female.   Chief Complaint: melanotic nailbed HPI: Sheila Livingston is a 72 year old right-hand-dominant former patient not been seen in 6 years. She is referred by Merdis Delay PA for a discoloration of her right thumb nail matrix. He does not complain of any pain or discomfort. She states this is been present for several months. She recalls no history of injury to it she has had a biopsy done by myself 6 years ago to a smaller version of a melanotic streak which had no pathologic abnormality on biopsy. States that she has not noticed the discoloration enlarged. Has had a basal cell and squamous carcinoma in the past. Former smoker. No history diabetes.      Past Medical History:  Diagnosis Date  . Allergy   . Anal fissure   . Arthritis    hands  . Benign paroxysmal positional vertigo   . Complication of anesthesia   . Endometriosis   . Hemorrhoids   . History of skin cancer   . Hyperlipidemia   . Hyperplastic colon polyp   . Hypertension   . IBS (irritable bowel syndrome)   . PONV (postoperative nausea and vomiting)     Past Surgical History:  Procedure Laterality Date  . ABDOMINAL HYSTERECTOMY     bso enometriosis; no cancer  . BUNIONECTOMY Left 11/23/2008  . ROTATOR CUFF REPAIR Right 10/2009  . thumb nail surgery Right     Family History  Problem Relation Age of Onset  . Diabetes Mother   . Stroke Mother   . Breast cancer Sister   . Colon polyps Sister   . Diabetes Sister   . Colon polyps Sister   . Colon cancer Neg Hx    Social History:  reports that she has quit smoking. She has never used smokeless tobacco. She reports that she drinks alcohol. She reports that she does not use drugs.  Allergies:  Allergies  Allergen Reactions  . Flonase [Fluticasone Propionate]   . Fluticasone Propionate     REACTION: Facial swelling under eyes nose bleeds  . Lisinopril     REACTION: dizziness and light headed  . Loperamide Hcl     REACTION: passed out     No prescriptions prior to admission.    No results found for this or any previous visit (from the past 48 hour(s)).  No results found.   Pertinent items are noted in HPI.  There were no vitals taken for this visit.  General appearance: alert, cooperative and appears stated age Head: Normocephalic, without obvious abnormality Neck: no JVD Resp: clear to auscultation bilaterally Cardio: regular rate and rhythm, S1, S2 normal, no murmur, click, rub or gallop GI: soft, non-tender; bowel sounds normal; no masses,  no organomegaly Extremities: black thumb nail right Pulses: 2+ and symmetric Skin: Skin color, texture, turgor normal. No rashes or lesions Neurologic: Grossly normal Incision/Wound: na  Assessment/Plan Assessment:  1. Melanonychia  Right (thumb)    Plan: We have discussed with her the possibility of biopsy the of the nail right thumb I would recommend removal of the entire nail nail plate extending the entire specimen off and that I have had the experience of sending portions often having pathology states that it biopsy is incomplete and they are unable to make a diagnosis requiring a second or even third nail biopsy for a diagnosis is made. Discussed possibility of melanotic changes with her possibility of the necessity of even an amputation. She would like to proceed  to have this done this be scheduled an outpatient under regional anesthesia. She is aware that there is no guarantee to the surgery the possibility of infection recurrence injury to arteries nerves tendons possibility of decreased sensation to the pulp should this not be malignancy. Stability of amputation of the distal phalanx with sentinel node should the biopsy be positive. Questions are encouraged and answered to her satisfaction. Scheduled for ablation of her nail with full-thickness skin graft from the upper inner arm right hand to the right thumb.      Sheila Livingston R 05/12/2016, 10:13 AM

## 2016-05-12 NOTE — Anesthesia Postprocedure Evaluation (Signed)
Anesthesia Post Note  Patient: Sheila Livingston  Procedure(s) Performed: Procedure(s) (LRB): EXCISION OF NAIL MATRIX BED RIGHT (Right) UPPER ARM SKIN GRAFT RIGHT THUMB (Right)  Patient location during evaluation: PACU Anesthesia Type: Bier Block Level of consciousness: awake and alert Pain management: pain level controlled Vital Signs Assessment: post-procedure vital signs reviewed and stable Respiratory status: spontaneous breathing Cardiovascular status: stable Anesthetic complications: no       Last Vitals:  Vitals:   05/12/16 1315 05/12/16 1333  BP: (!) 143/60 (!) 152/42  Pulse: 65 (!) 52  Resp: 16   Temp:  36.5 C    Last Pain:  Vitals:   05/12/16 1333  TempSrc:   PainSc: 0-No pain                 Nolon Nations

## 2016-05-12 NOTE — Brief Op Note (Signed)
05/12/2016  12:38 PM  PATIENT:  Sheila Livingston  71 y.o. female  PRE-OPERATIVE DIAGNOSIS:  Jackson L60.8  POST-OPERATIVE DIAGNOSIS:  MELONYCHIA RIGHT THUMB L  PROCEDURE:  Procedure(s): EXCISION OF NAIL MATRIX BED RIGHT (Right) UPPER ARM SKIN GRAFT RIGHT THUMB (Right)  SURGEON:  Surgeon(s) and Role:    * Daryll Brod, MD - Primary  PHYSICIAN ASSISTANT:   ASSISTANTS: none   ANESTHESIA:   local and regional  EBL:  Total I/O In: 500 [I.V.:500] Out: -   BLOOD ADMINISTERED:none  DRAINS: none   LOCAL MEDICATIONS USED:  BUPIVICAINE   SPECIMEN:  Excision  DISPOSITION OF SPECIMEN:  PATHOLOGY  COUNTS:  YES  TOURNIQUET:   Total Tourniquet Time Documented: Upper Arm (Right) - 51 minutes Total: Upper Arm (Right) - 51 minutes   DICTATION: .Other Dictation: Dictation Number [id number]  PLAN OF CARE: Discharge to home after PACU  PATIENT DISPOSITION:  PACU - hemodynamically stable.

## 2016-05-12 NOTE — Anesthesia Procedure Notes (Signed)
Anesthesia Regional Block: Bier block (IV Regional)   Pre-Anesthetic Checklist: ,, timeout performed, Correct Patient, Correct Site, Correct Laterality, Correct Procedure,, site marked, surgical consent,, at surgeon's request  Laterality: Right     Needles:  Injection technique: Single-shot  Needle Type: Other      Needle Gauge: 22     Additional Needles:   Procedures:,,,,,,, Esmarch exsanguination, single tourniquet utilized,  Narrative:   Performed by: Personally       

## 2016-05-12 NOTE — Op Note (Signed)
Dictation Number [id number]

## 2016-05-12 NOTE — Anesthesia Procedure Notes (Signed)
Procedure Name: MAC Date/Time: 05/12/2016 11:50 AM Performed by: Moody Robben D Pre-anesthesia Checklist: Patient identified, Emergency Drugs available, Suction available, Patient being monitored and Timeout performed Patient Re-evaluated:Patient Re-evaluated prior to inductionOxygen Delivery Method: Simple face mask

## 2016-05-12 NOTE — Transfer of Care (Signed)
Immediate Anesthesia Transfer of Care Note  Patient: Sheila Livingston  Procedure(s) Performed: Procedure(s): EXCISION OF NAIL MATRIX BED RIGHT (Right) UPPER ARM SKIN GRAFT RIGHT THUMB (Right)  Patient Location: PACU  Anesthesia Type:Bier block  Level of Consciousness: awake, alert , oriented and patient cooperative  Airway & Oxygen Therapy: Patient Spontanous Breathing and Patient connected to face mask oxygen  Post-op Assessment: Report given to RN and Post -op Vital signs reviewed and stable  Post vital signs: Reviewed and stable  Last Vitals:  Vitals:   05/12/16 1115  BP: (!) 147/68  Pulse: 65  Resp: 16  Temp: 36.6 C    Last Pain:  Vitals:   05/12/16 1115  TempSrc: Oral         Complications: No apparent anesthesia complications

## 2016-05-13 NOTE — Op Note (Signed)
[name], [name]                  ACCOUNT NO.:  [id number]  MEDICAL RECORD NO.:  [id number]  LOCATION:                                 FACILITY:  PHYSICIAN:  Daryll Brod, [name]            DATE OF BIRTH:  DATE OF PROCEDURE:  05/12/2016 DATE OF DISCHARGE:                              OPERATIVE REPORT   PREOPERATIVE DIAGNOSIS:  Melanotic right thumb nail matrix.  POSTOPERATIVE DIAGNOSIS:  Melanotic right thumb nail matrix.  OPERATION:  Removal of nail matrix with full-thickness skin graft, right thumb.  SURGEON:  Daryll Brod, [name]  ASSISTANT:  None.  ANESTHESIA:  Upper arm IV regional with metacarpal block, local infiltration.  PLACE OF SURGERY:  [facility].  ANESTHESIOLOGIST:  Dr. Lissa Hoard.  HISTORY:  The patient is a 71 year old female, who comes in with a black thumb, entire nail matrix.  She is scheduled for biopsy with excision of the entire nail matrix, right thumb, with skin graft from the upper arm. Pre, peri, and postoperative course have been discussed along with risks and complications.  She is aware that there is no guarantee to the surgery; the possibility of infection; recurrence of injury to arteries, nerves, tendons; incomplete relief of symptoms; dystrophy; the possibility of this being a melanoma requiring further intervention.  In the preoperative area, the patient was seen, the extremity marked by both patient and surgeon.  Antibiotic given.  PROCEDURE IN DETAIL:  The patient was brought to the operating room, where an upper arm IV regional anesthetic was carried out without difficulty under the direction of the Anesthesia Department.  She was prepped using ChloraPrep in a supine position with the right arm free. A 3-minute dry time was allowed and time-out taken confirming the patient and procedure.  A metacarpal block was given with 0.25% bupivacaine without epinephrine to the right thumb.  The nail plate was then removed with a Publishing copy.  This allowed visualization of the nail matrix.  On lifting the proximal nail fold, blackened area was present over the entire proximal nail matrix and dorsal nail fold.  An incision was then made around the entire nail matrix.  The dorsal nail fold was included in the specimen.  This was then undermined removing the nail matrix from the distal phalanx over its entire course, taking care to remove the corner swallowtails on both radial and ulnar aspect. The entire specimen was sent as 1 specimen including the entire nail matrix dorsally, palmarly, and distally.  The wound was copiously irrigated with saline.  Instruments were then changed.  A template was made, measured, remeasured.  This was then placed on the upper inner arm.  An elliptical incision was then made, removing the full-thickness skin graft from the inner upper arm.  The template had been marked on the skin prior to elevation.  This was set aside.  The wound was irrigated, undermined, and closed with horizontal mattress, vertical mattress, interrupted 4-0 nylon sutures.  The skin graft was then placed and cut to fit the template, which had been inscribed onto the surface. This was then placed after irrigation of the  thumb and sutured into position with a running 6-0 chromic suture.  This entirely closed the defect.  A stent dressing was then applied using nonadherent gauze, Steri-Strips, and benzoin.  A sterile compressive dressing and splint to the dorsum of the finger applied.  The proximal wound was then covered with nonadherent gauze and Hypafix.  On deflation of the tourniquet, remaining fingers pinked.  She was taken to the recovery room for observation in satisfactory condition.  She will be discharged to home to return to [facility] in 1 week, on [place].          ______________________________ Daryll Brod, [name]     GK/MEDQ  D:  05/12/2016  T:  05/13/2016  Job:  [id number]

## 2016-05-14 ENCOUNTER — Encounter (HOSPITAL_BASED_OUTPATIENT_CLINIC_OR_DEPARTMENT_OTHER): Payer: Self-pay | Admitting: Orthopedic Surgery

## 2016-05-21 ENCOUNTER — Other Ambulatory Visit: Payer: Self-pay | Admitting: Oncology

## 2016-05-22 ENCOUNTER — Telehealth: Payer: Self-pay | Admitting: Oncology

## 2016-05-22 ENCOUNTER — Encounter: Payer: Self-pay | Admitting: Oncology

## 2016-05-22 NOTE — Telephone Encounter (Signed)
Appt has been scheduled for the pt to see Dr. Jana Hakim on 5/4 at 130pm. Unable to reach the pt. Left a vm w/appt date and time. Letter mailed to the pt and faxed to the referring

## 2016-05-27 ENCOUNTER — Other Ambulatory Visit (HOSPITAL_COMMUNITY): Payer: Self-pay | Admitting: Surgery

## 2016-05-27 DIAGNOSIS — D038 Melanoma in situ of other sites: Secondary | ICD-10-CM

## 2016-05-28 ENCOUNTER — Other Ambulatory Visit: Payer: Self-pay | Admitting: Orthopedic Surgery

## 2016-05-28 ENCOUNTER — Encounter (HOSPITAL_BASED_OUTPATIENT_CLINIC_OR_DEPARTMENT_OTHER): Payer: Self-pay | Admitting: *Deleted

## 2016-05-29 ENCOUNTER — Ambulatory Visit (HOSPITAL_BASED_OUTPATIENT_CLINIC_OR_DEPARTMENT_OTHER): Payer: Medicare Other | Admitting: Oncology

## 2016-05-29 DIAGNOSIS — D038 Melanoma in situ of other sites: Secondary | ICD-10-CM | POA: Diagnosis not present

## 2016-05-29 NOTE — Progress Notes (Signed)
[facility]  [phone] [phone]     ID: Sheila Livingston DOB: October 27, 1953  MR#: [id number]  UTM#:[id number]  Patient Care Team: Burnis Medin, MD as PCP - Jacklynn Lewis, MD (Orthopedic Surgery) Crista Luria, MD as Attending Physician (Dermatology) amy Mickelsen  Linda Hedges, DO as Consulting Physician (Obstetrics and Gynecology) Daryll Brod, MD as Consulting Physician (Orthopedic Surgery) Chauncey Cruel, MD OTHER MD:  CHIEF COMPLAINT: acral melanoma in situ  CURRENT TREATMENT: Definitive surgery pending   HISTORY OF PRESENT ILLNESS: Keyly tells me she had a "streak" on her right thumbnail in 2012, and that this was biopsied and it was benign. I do not find that pathology report. More recently, for several months she has noted darkening of the right thumb nail head, and she was referred back to Dr. Fredna Dow for evaluation. He describes the nailbed to be dark-colored but otherwise the finger to be flexible, and with no evidence of satellite lesions or adenopathy. Excisional biopsy of this lesion on 05/12/2016 ([id number]) showed malignant melanoma in situ, a coil lentiginous type. The distal margin was positive. The proximal margin was less than 0.1 mm. Amputation of the right thumb at the IP joint has been proposed. The patient was referred to oncology and also to general surgery for consideration of sentinel lymph node sampling  INTERVAL HISTORY: Janewas evaluated in the cancer clinic 05/29/2016  REVIEW OF SYSTEMS: Aside from minor problems such as a history of constipation and early cataracts, a detailed review of systems today was noncontributory  PAST MEDICAL HISTORY: Past Medical History:  Diagnosis Date  . Allergy   . Anal fissure   . Arthritis    hands  . Benign paroxysmal positional vertigo   . Complication of anesthesia   . Endometriosis   . Hemorrhoids   . History of skin cancer   . Hyperlipidemia   . Hyperplastic colon polyp     . Hypertension   . IBS (irritable bowel syndrome)   . PONV (postoperative nausea and vomiting)     PAST SURGICAL HISTORY: Past Surgical History:  Procedure Laterality Date  . ABDOMINAL HYSTERECTOMY     bso enometriosis; no cancer  . BUNIONECTOMY Left 11/23/2008  . NAILBED REPAIR Right 05/12/2016   Procedure: EXCISION OF NAIL MATRIX BED RIGHT;  Surgeon: Daryll Brod, MD;  Location: [facility];  Service: Orthopedics;  Laterality: Right;  . ROTATOR CUFF REPAIR Right 10/2009  . SKIN FULL THICKNESS GRAFT Right 05/12/2016   Procedure: UPPER ARM SKIN GRAFT RIGHT THUMB;  Surgeon: Daryll Brod, MD;  Location: [facility];  Service: Orthopedics;  Laterality: Right;  . thumb nail surgery Right     FAMILY HISTORY Family History  Problem Relation Age of Onset  . Diabetes Mother   . Stroke Mother   . Breast cancer Sister   . Colon polyps Sister   . Diabetes Sister   . Colon polyps Sister   . Colon cancer Neg Hx   The patient's father died from noncancer related causes at age 51. The patient's mother is alive at age 74. She has a history of non-Hodgkin's lymphoma remotely. The patient has one brother, 2 sisters. One of the patient's sisters was diagnosed with breast cancer in her 48s. There is also a paternal uncle with a history of female breast cancer. There is no history of melanoma in the family.  GYNECOLOGIC HISTORY:  No LMP recorded. Patient has had a hysterectomy. Menarche age 46, she is [place]  P0. She underwent hysterectomy at age 50 and later bilateral salpingo-oophorectomy. She was on hormone replacement until approximately 2006  SOCIAL HISTORY:  She worked for [employer] but is now retired. She married for the second time approximately one year ago.    ADVANCED DIRECTIVES: Not in place   HEALTH MAINTENANCE: Social History  Substance Use Topics  . Smoking status: Former Research scientist (life sciences)  . Smokeless tobacco: Never Used  . Alcohol use 0.0 oz/week     Comment: rare      Colonoscopy: 2014  PAP: Status post hysterectomy  Bone density:   Allergies  Allergen Reactions  . Imodium [Loperamide] Other (See Comments)  . Flonase [Fluticasone Propionate]   . Fluticasone Propionate     REACTION: Facial swelling under eyes nose bleeds  . Lisinopril     REACTION: dizziness and light headed  . Loperamide Hcl     REACTION: passed out  . Other Other (See Comments)    Hummus? Swelling of face  . Benadryl [Diphenhydramine] Anxiety    Current Outpatient Prescriptions  Medication Sig Dispense Refill  . aspirin 81 MG chewable tablet Chew 81 mg by mouth daily.      . Cholecalciferol (VITAMIN D3) 400 UNITS CAPS Take 1 capsule by mouth daily.    Marland Kitchen docusate sodium (COLACE) 100 MG capsule Take 100 mg by mouth 2 (two) times daily.    . Flaxseed, Linseed, (FLAX SEED OIL PO) Take by mouth.      . losartan (COZAAR) 50 MG tablet TAKE 1 TABLET BY MOUTH  DAILY 90 tablet 0  . LYSINE PO Take by mouth.    . MULTIPLE VITAMIN PO Take by mouth.      . Omega-3 Fatty Acids (FISH OIL PO) Take by mouth.     No current facility-administered medications for this visit.     OBJECTIVE: Middle-aged white woman in no acute distress Vitals:   05/29/16 1320  BP: (!) 123/53  Pulse: 71  Resp: 18  Temp: 97.9 F (36.6 C)     Body mass index is 24.1 kg/m.    ECOG FS:0 - Asymptomatic  Ocular: Sclerae unicteric, pupils round and equal Ear-nose-throat: Oropharynx clear and moist Lymphatic: No cervical or supraclavicular adenopathy Lungs no rales or rhonchi, good excursion bilaterally Heart regular rate and rhythm, no murmur appreciated Abd soft, nontender, positive bowel sounds; gynecologic exam deferred MSK no focal spinal tenderness, no joint edema Neuro: non-focal, well-oriented, appropriate affect Breasts: Deferred Skin: Examination of the skin shows no lesions of concern. The right thumbnail at the base as a darkened area. There are no lesions in the rest of the hand or  forearm and no palpable right epitrochlear or axillary adenopathy    LAB RESULTS:  CMP     Component Value Date/Time   NA 140 12/12/2014 1118   K 4.1 12/12/2014 1118   CL 104 12/12/2014 1118   CO2 28 12/12/2014 1118   GLUCOSE 92 12/12/2014 1118   GLUCOSE 91 12/15/2005 0930   BUN 14 12/12/2014 1118   CREATININE 0.79 12/12/2014 1118   CALCIUM 9.9 12/12/2014 1118   PROT 7.7 10/27/2013 0942   ALBUMIN 4.3 10/27/2013 0942   AST 24 10/27/2013 0942   ALT 25 10/27/2013 0942   ALKPHOS 72 10/27/2013 0942   BILITOT 0.8 10/27/2013 0942   GFRNONAA >60 08/18/2010 1331   GFRAA >60 08/18/2010 1331    No results found for: TOTALPROTELP, ALBUMINELP, A1GS, A2GS, BETS, BETA2SER, GAMS, MSPIKE, SPEI  No results found for: KPAFRELGTCHN,  LAMBDASER, [facility]  Lab Results  Component Value Date   WBC 5.9 10/27/2013   NEUTROABS 3.3 10/27/2013   HGB 13.6 10/27/2013   HCT 39.9 10/27/2013   MCV 93.2 10/27/2013   PLT 215.0 10/27/2013      Chemistry      Component Value Date/Time   NA 140 12/12/2014 1118   K 4.1 12/12/2014 1118   CL 104 12/12/2014 1118   CO2 28 12/12/2014 1118   BUN 14 12/12/2014 1118   CREATININE 0.79 12/12/2014 1118      Component Value Date/Time   CALCIUM 9.9 12/12/2014 1118   ALKPHOS 72 10/27/2013 0942   AST 24 10/27/2013 0942   ALT 25 10/27/2013 0942   BILITOT 0.8 10/27/2013 0942       No results found for: LABCA2  No components found for: [id number]  No results for input(s): INR in the last 168 hours.  Urinalysis    Component Value Date/Time   COLORURINE yellow 01/03/2010 0750   APPEARANCEUR Clear 01/03/2010 0750   LABSPEC 1.020 01/03/2010 0750   PHURINE 7.0 01/03/2010 0750   HGBUR negative 01/03/2010 0750   BILIRUBINUR n 01/07/2011 1514   PROTEINUR n 01/07/2011 1514   UROBILINOGEN 0.2 01/07/2011 1514   UROBILINOGEN 0.2 01/03/2010 0750   NITRITE n 01/07/2011 1514   NITRITE negative 01/03/2010 0750   LEUKOCYTESUR Negative 01/07/2011 1514      STUDIES: 05/04/2016 right finger firmness reviewed with patient  ELIGIBLE FOR AVAILABLE RESEARCH PROTOCOL: no  ASSESSMENT: 71 y.o. Starling Manns woman status post right thumbnail biopsy 05/12/2016 showing malignant melanoma in situ, acral lentiginous type, with positive distal and very close proximal margins  (1) definitive surgery planned: Right thumb amputation at the IP joint  (2) sentinel lymph node sampling not needed in the absence of invasive disease  (3) no indication for systemic treatment  (4) yearly screening dermatologic exam and yearly screening retinal exam recommended  (5) patient does not meet criteria for genetic testing; however patient's sister does meet criteria for breast cancer genetic testing   PLAN: We spent the better part of today's 50 minute appointment discussing the biology of melanoma in general and the specifics of her situation. She understands the information we have as of now shows no invasion of the deeper layers and therefore no possibility for spread to lymph nodes. Accordingly assuming that the final pathology does not show invasion sentinel lymph node sampling will not be necessary.  We discussed the fact that for in situ melanoma and margin of at least a half a centimeter is standard and therefore she will need further surgery which is already planned.  We have excellent systemic therapy for advanced melanoma these days, but there is no indication for those drugs in this situation. We do not have drugs to prevent melanoma from developing. I did recommend yearly total skin exam by her dermatologist and yearly retinal exam by her ophthalmologist  There is no family history of melanoma. There is however a strong suggestion of a breast cancer mutation in this family. Elenora herself does not qualify for testing but one of her sisters does. I urged her to get that sister tested and I gave her the information on how to contact our genetics counselor to get  that accomplished. If the sister does carry a mutation then Amya herself as well as other family members would qualify for testing.  [place] has a good understanding of the overall plan. She agrees with it. She knows the goal of treatment  in her case is cure. I have not made a return appointment for her with me here but will be glad to see her anytime in the future if on when the need arises.  Chauncey Cruel, MD   05/31/2016 10:54 AM Medical Oncology and Hematology [facility] [address] [place], [place] [address] Tel. [phone]    Fax. [phone]

## 2016-05-31 ENCOUNTER — Other Ambulatory Visit: Payer: Self-pay | Admitting: Oncology

## 2016-05-31 DIAGNOSIS — D039 Melanoma in situ, unspecified: Secondary | ICD-10-CM

## 2016-05-31 HISTORY — DX: Melanoma in situ, unspecified: D03.9

## 2016-05-31 NOTE — Progress Notes (Unsigned)
[facility] Initial Visit:  Patient Care Team: Panosh, Standley Brooking, MD as PCP - Jacklynn Lewis, MD (Orthopedic Surgery) Crista Luria, MD as Attending Physician (Dermatology) amy Scoggins  Linda Hedges, DO as Consulting Physician (Obstetrics and Gynecology) Daryll Brod, MD as Consulting Physician (Orthopedic Surgery) Johnathan Hausen, MD as Consulting Physician (General Surgery)  CHIEF COMPLAINTS/PURPOSE OF CONSULTATION:   No history exists.    HISTORY OF PRESENTING ILLNESS: Sheila Livingston 71 y.o. female is here because of  ***  Review of Systems - Oncology  MEDICAL HISTORY: Past Medical History:  Diagnosis Date  . Allergy   . Anal fissure   . Arthritis    hands  . Benign paroxysmal positional vertigo   . Complication of anesthesia   . Endometriosis   . Hemorrhoids   . History of skin cancer   . Hyperlipidemia   . Hyperplastic colon polyp   . Hypertension   . IBS (irritable bowel syndrome)   . PONV (postoperative nausea and vomiting)     SURGICAL HISTORY: Past Surgical History:  Procedure Laterality Date  . ABDOMINAL HYSTERECTOMY     bso enometriosis; no cancer  . BUNIONECTOMY Left 11/23/2008  . NAILBED REPAIR Right 05/12/2016   Procedure: EXCISION OF NAIL MATRIX BED RIGHT;  Surgeon: Daryll Brod, MD;  Location: [facility];  Service: Orthopedics;  Laterality: Right;  . ROTATOR CUFF REPAIR Right 10/2009  . SKIN FULL THICKNESS GRAFT Right 05/12/2016   Procedure: UPPER ARM SKIN GRAFT RIGHT THUMB;  Surgeon: Daryll Brod, MD;  Location: [facility];  Service: Orthopedics;  Laterality: Right;  . thumb nail surgery Right     SOCIAL HISTORY: Social History   Social History  . Marital status: Married    Spouse name: N/A  . Number of children: N/A  . Years of education: N/A   Occupational History  . Not on file.   Social History Main Topics  . Smoking status: Former Research scientist (life sciences)  . Smokeless tobacco: Never Used  . Alcohol  use 0.0 oz/week     Comment: rare  . Drug use: No  . Sexual activity: Not on file   Other Topics Concern  . 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    FAMILY HISTORY Family History  Problem Relation Age of Onset  . Diabetes Mother   . Stroke Mother   . Breast cancer Sister   . Colon polyps Sister   . Diabetes Sister   . Colon polyps Sister   . Colon cancer Neg Hx     ALLERGIES:  is allergic to imodium [loperamide]; flonase [fluticasone propionate]; fluticasone propionate; lisinopril; loperamide hcl; other; and benadryl [diphenhydramine].  MEDICATIONS:  Current Outpatient Prescriptions  Medication Sig Dispense Refill  . aspirin 81 MG chewable tablet Chew 81 mg by mouth daily.      . Cholecalciferol (VITAMIN D3) 400 UNITS CAPS Take 1 capsule by mouth daily.    Marland Kitchen docusate sodium (COLACE) 100 MG capsule Take 100 mg by mouth 2 (two) times daily.    . Flaxseed, Linseed, (FLAX SEED OIL PO) Take by mouth.      Marland Kitchen  losartan (COZAAR) 50 MG tablet TAKE 1 TABLET BY MOUTH  DAILY 90 tablet 0  . LYSINE PO Take by mouth.    . MULTIPLE VITAMIN PO Take by mouth.      . Omega-3 Fatty Acids (FISH OIL PO) Take by mouth.     No current facility-administered medications for this visit.     PHYSICAL EXAMINATION:  ECOG PERFORMANCE STATUS: {CHL ONC ECOG PS:[id number]}   There were no vitals filed for this visit.  There were no vitals filed for this visit.   Physical Exam   LABORATORY DATA: I have personally reviewed the data as listed:  No visits with results within 1 Month(s) from this visit.  Latest known visit with results is:  Abstract on 04/14/2016  Component Date Value Ref Range Status  . HM Mammogram 04/09/2016 0-4 Bi-Rad  0-4 Bi-Rad,  Self Reported Normal Final    RADIOGRAPHIC STUDIES: I have personally reviewed the radiological images as listed and agree with the findings in the report  No results found.  ASSESSMENT/PLAN Cancer Staging No matching staging information was found for the patient.   No problem-specific Assessment & Plan notes found for this encounter.   No orders of the defined types were placed in this encounter.   All questions were answered. The patient knows to call the clinic with any problems, questions or concerns.  This note was electronically signed.    Chauncey Cruel, MD  05/31/2016 11:20 AM

## 2016-06-01 ENCOUNTER — Telehealth: Payer: Self-pay | Admitting: *Deleted

## 2016-06-01 ENCOUNTER — Encounter (HOSPITAL_BASED_OUTPATIENT_CLINIC_OR_DEPARTMENT_OTHER): Payer: Self-pay | Admitting: Anesthesiology

## 2016-06-01 NOTE — Telephone Encounter (Signed)
Dr. Jana Hakim sent information about me needing annual F/U for Melanoma.  Dr. Tonia Brooms has retired.  I now see Almyra Free PA-C with the [facility] on [address]."  Will notify provider to re-send this information.  Care Team updated.

## 2016-06-02 ENCOUNTER — Ambulatory Visit (HOSPITAL_BASED_OUTPATIENT_CLINIC_OR_DEPARTMENT_OTHER)
Admission: RE | Admit: 2016-06-02 | Discharge: 2016-06-02 | Disposition: A | Discharge status: Home / Self Care | Payer: Medicare Other | Source: Ambulatory Visit | Attending: Orthopedic Surgery | Admitting: Orthopedic Surgery

## 2016-06-02 ENCOUNTER — Ambulatory Visit (HOSPITAL_COMMUNITY): Payer: Medicare Other

## 2016-06-02 ENCOUNTER — Encounter (HOSPITAL_BASED_OUTPATIENT_CLINIC_OR_DEPARTMENT_OTHER)
Admission: RE | Disposition: A | Discharge status: Home / Self Care | Payer: Self-pay | Source: Ambulatory Visit | Attending: Orthopedic Surgery

## 2016-06-02 ENCOUNTER — Ambulatory Visit (HOSPITAL_BASED_OUTPATIENT_CLINIC_OR_DEPARTMENT_OTHER): Payer: Medicare Other | Admitting: Anesthesiology

## 2016-06-02 ENCOUNTER — Encounter (HOSPITAL_BASED_OUTPATIENT_CLINIC_OR_DEPARTMENT_OTHER): Payer: Self-pay | Admitting: *Deleted

## 2016-06-02 ENCOUNTER — Encounter (HOSPITAL_COMMUNITY): Payer: Self-pay

## 2016-06-02 DIAGNOSIS — Z85828 Personal history of other malignant neoplasm of skin: Secondary | ICD-10-CM | POA: Diagnosis not present

## 2016-06-02 DIAGNOSIS — Z8719 Personal history of other diseases of the digestive system: Secondary | ICD-10-CM | POA: Diagnosis not present

## 2016-06-02 DIAGNOSIS — K589 Irritable bowel syndrome without diarrhea: Secondary | ICD-10-CM | POA: Insufficient documentation

## 2016-06-02 DIAGNOSIS — M19042 Primary osteoarthritis, left hand: Secondary | ICD-10-CM | POA: Insufficient documentation

## 2016-06-02 DIAGNOSIS — Z90710 Acquired absence of both cervix and uterus: Secondary | ICD-10-CM | POA: Diagnosis not present

## 2016-06-02 DIAGNOSIS — Z9889 Other specified postprocedural states: Secondary | ICD-10-CM | POA: Diagnosis not present

## 2016-06-02 DIAGNOSIS — Z888 Allergy status to other drugs, medicaments and biological substances status: Secondary | ICD-10-CM | POA: Diagnosis not present

## 2016-06-02 DIAGNOSIS — H8110 Benign paroxysmal vertigo, unspecified ear: Secondary | ICD-10-CM | POA: Diagnosis not present

## 2016-06-02 DIAGNOSIS — E785 Hyperlipidemia, unspecified: Secondary | ICD-10-CM | POA: Diagnosis not present

## 2016-06-02 DIAGNOSIS — C4361 Malignant melanoma of right upper limb, including shoulder: Secondary | ICD-10-CM | POA: Insufficient documentation

## 2016-06-02 DIAGNOSIS — Z803 Family history of malignant neoplasm of breast: Secondary | ICD-10-CM | POA: Insufficient documentation

## 2016-06-02 DIAGNOSIS — Z823 Family history of stroke: Secondary | ICD-10-CM | POA: Insufficient documentation

## 2016-06-02 DIAGNOSIS — M19041 Primary osteoarthritis, right hand: Secondary | ICD-10-CM | POA: Diagnosis not present

## 2016-06-02 DIAGNOSIS — Z8371 Family history of colonic polyps: Secondary | ICD-10-CM | POA: Insufficient documentation

## 2016-06-02 DIAGNOSIS — Z91018 Allergy to other foods: Secondary | ICD-10-CM | POA: Insufficient documentation

## 2016-06-02 DIAGNOSIS — Z833 Family history of diabetes mellitus: Secondary | ICD-10-CM | POA: Diagnosis not present

## 2016-06-02 DIAGNOSIS — Z87891 Personal history of nicotine dependence: Secondary | ICD-10-CM | POA: Insufficient documentation

## 2016-06-02 DIAGNOSIS — I10 Essential (primary) hypertension: Secondary | ICD-10-CM | POA: Insufficient documentation

## 2016-06-02 HISTORY — PX: AMPUTATION: SHX166

## 2016-06-02 SURGERY — AMPUTATION DIGIT
Anesthesia: Regional | Site: Thumb | Laterality: Right

## 2016-06-02 MED ORDER — FENTANYL CITRATE (PF) 100 MCG/2ML IJ SOLN
INTRAMUSCULAR | Status: DC | PRN
  Administered 2016-06-02: 100 ug via INTRAVENOUS

## 2016-06-02 MED ORDER — FENTANYL CITRATE (PF) 100 MCG/2ML IJ SOLN
INTRAMUSCULAR | Status: AC
  Filled 2016-06-02: qty 2

## 2016-06-02 MED ORDER — BUPIVACAINE HCL (PF) 0.25 % IJ SOLN
INTRAMUSCULAR | Status: DC | PRN
  Administered 2016-06-02: 9 mL

## 2016-06-02 MED ORDER — LIDOCAINE 2% (20 MG/ML) 5 ML SYRINGE
INTRAMUSCULAR | Status: AC
  Filled 2016-06-02: qty 5

## 2016-06-02 MED ORDER — LIDOCAINE HCL (PF) 0.5 % IJ SOLN
INTRAMUSCULAR | Status: DC | PRN
  Administered 2016-06-02: 30 mL via INTRAVENOUS

## 2016-06-02 MED ORDER — SCOPOLAMINE 1 MG/3DAYS TD PT72: 1.0000 | MEDICATED_PATCH | Freq: Once | TRANSDERMAL | Status: DC | PRN

## 2016-06-02 MED ORDER — HYDROMORPHONE HCL 1 MG/ML IJ SOLN: 0.2500 mg | INTRAMUSCULAR | Status: DC | PRN

## 2016-06-02 MED ORDER — LACTATED RINGERS IV SOLN
INTRAVENOUS | Status: DC
  Administered 2016-06-02 (×2): via INTRAVENOUS

## 2016-06-02 MED ORDER — FENTANYL CITRATE (PF) 100 MCG/2ML IJ SOLN: 50.0000 ug | INTRAMUSCULAR | Status: DC | PRN

## 2016-06-02 MED ORDER — OXYCODONE HCL 5 MG/5ML PO SOLN: 5.0000 mg | Freq: Once | ORAL | Status: DC | PRN

## 2016-06-02 MED ORDER — PROMETHAZINE HCL 25 MG/ML IJ SOLN: 6.2500 mg | INTRAMUSCULAR | Status: DC | PRN

## 2016-06-02 MED ORDER — OXYCODONE HCL 5 MG PO TABS: 5.0000 mg | ORAL_TABLET | Freq: Once | ORAL | Status: DC | PRN

## 2016-06-02 MED ORDER — CEFAZOLIN SODIUM-DEXTROSE 2-4 GM/100ML-% IV SOLN
2.0000 g | INTRAVENOUS | Status: AC
  Administered 2016-06-02: 2 g via INTRAVENOUS

## 2016-06-02 MED ORDER — CHLORHEXIDINE GLUCONATE 4 % EX LIQD: 60.0000 mL | Freq: Once | CUTANEOUS | Status: DC

## 2016-06-02 MED ORDER — MIDAZOLAM HCL 2 MG/2ML IJ SOLN: 1.0000 mg | INTRAMUSCULAR | Status: DC | PRN

## 2016-06-02 MED ORDER — CEFAZOLIN SODIUM-DEXTROSE 2-4 GM/100ML-% IV SOLN
INTRAVENOUS | Status: AC
  Filled 2016-06-02: qty 100

## 2016-06-02 MED ORDER — ONDANSETRON HCL 4 MG/2ML IJ SOLN
INTRAMUSCULAR | Status: AC
  Filled 2016-06-02: qty 2

## 2016-06-02 SURGICAL SUPPLY — 42 items
BLADE MINI RND TIP GREEN BEAV (BLADE) IMPLANT
BLADE OSC/SAG .038X5.5 CUT EDG (BLADE) IMPLANT
BLADE SURG 15 STRL LF DISP TIS (BLADE) ×1 IMPLANT
BLADE SURG 15 STRL SS (BLADE) ×1
BNDG COHESIVE 1X5 TAN STRL LF (GAUZE/BANDAGES/DRESSINGS) ×2 IMPLANT
BNDG ESMARK 4X9 LF (GAUZE/BANDAGES/DRESSINGS) ×2 IMPLANT
CHLORAPREP W/TINT 26ML (MISCELLANEOUS) ×2 IMPLANT
CORDS BIPOLAR (ELECTRODE) ×2 IMPLANT
COVER BACK TABLE 60X90IN (DRAPES) ×2 IMPLANT
COVER MAYO STAND STRL (DRAPES) ×2 IMPLANT
CUFF TOURNIQUET SINGLE 18IN (TOURNIQUET CUFF) ×2 IMPLANT
DECANTER SPIKE VIAL GLASS SM (MISCELLANEOUS) IMPLANT
DRAIN PENROSE 1/4X12 LTX STRL (WOUND CARE) IMPLANT
DRAPE EXTREMITY T 121X128X90 (DRAPE) ×2 IMPLANT
DRAPE OEC MINIVIEW 54X84 (DRAPES) IMPLANT
DRAPE SURG 17X23 STRL (DRAPES) ×2 IMPLANT
GAUZE SPONGE 4X4 12PLY STRL (GAUZE/BANDAGES/DRESSINGS) ×2 IMPLANT
GAUZE XEROFORM 1X8 LF (GAUZE/BANDAGES/DRESSINGS) ×2 IMPLANT
GLOVE BIOGEL PI IND STRL 7.0 (GLOVE) ×2 IMPLANT
GLOVE BIOGEL PI IND STRL 8.5 (GLOVE) ×1 IMPLANT
GLOVE BIOGEL PI INDICATOR 7.0 (GLOVE) ×2
GLOVE BIOGEL PI INDICATOR 8.5 (GLOVE) ×1
GLOVE ECLIPSE 6.5 STRL STRAW (GLOVE) ×2 IMPLANT
GLOVE SURG ORTHO 8.0 STRL STRW (GLOVE) ×2 IMPLANT
GOWN STRL REUS W/ TWL LRG LVL3 (GOWN DISPOSABLE) ×1 IMPLANT
GOWN STRL REUS W/TWL LRG LVL3 (GOWN DISPOSABLE) ×1
GOWN STRL REUS W/TWL XL LVL3 (GOWN DISPOSABLE) ×2 IMPLANT
NS IRRIG 1000ML POUR BTL (IV SOLUTION) ×2 IMPLANT
PACK BASIN DAY SURGERY FS (CUSTOM PROCEDURE TRAY) ×2 IMPLANT
PADDING CAST ABS 4INX4YD NS (CAST SUPPLIES) ×1
PADDING CAST ABS COTTON 4X4 ST (CAST SUPPLIES) ×1 IMPLANT
SPLINT FINGER 4.25 BULB 911906 (SOFTGOODS) ×2 IMPLANT
STOCKINETTE 4X48 STRL (DRAPES) ×2 IMPLANT
SUT CHROMIC 4 0 P 3 18 (SUTURE) IMPLANT
SUT ETHILON 4 0 PS 2 18 (SUTURE) ×2 IMPLANT
SUT MERSILENE 4 0 P 3 (SUTURE) IMPLANT
SUT VIC AB 4-0 P-3 18XBRD (SUTURE) IMPLANT
SUT VIC AB 4-0 P3 18 (SUTURE)
SYR BULB 3OZ (MISCELLANEOUS) ×2 IMPLANT
SYR CONTROL 10ML LL (SYRINGE) IMPLANT
TOWEL OR 17X24 6PK STRL BLUE (TOWEL DISPOSABLE) ×2 IMPLANT
UNDERPAD 30X30 (UNDERPADS AND DIAPERS) ×2 IMPLANT

## 2016-06-02 NOTE — Anesthesia Postprocedure Evaluation (Signed)
Anesthesia Post Note  Patient: Sheila Livingston  Procedure(s) Performed: Procedure(s) (LRB): AMPUTATION RIGHT THUMB INTERPHALANGEAL JOINT (Right)  Patient location during evaluation: PACU Anesthesia Type: Bier Block Level of consciousness: awake and alert Pain management: pain level controlled Vital Signs Assessment: post-procedure vital signs reviewed and stable Respiratory status: spontaneous breathing, nonlabored ventilation and respiratory function stable Cardiovascular status: stable and blood pressure returned to baseline Anesthetic complications: no       Last Vitals:  Vitals:   06/02/16 1430 06/02/16 1450  BP: 136/71 130/68  Pulse: 73 84  Resp: 17 16  Temp:  36.7 C    Last Pain:  Vitals:   06/02/16 1430  TempSrc:   PainSc: 0-No pain                 Lynda Rainwater

## 2016-06-02 NOTE — Anesthesia Procedure Notes (Signed)
Anesthesia Regional Block: Bier block (IV Regional)   Pre-Anesthetic Checklist: ,, timeout performed, Correct Patient, Correct Site, Correct Laterality, Correct Procedure, Correct Position, site marked, Risks and benefits discussed,  Surgical consent,  Pre-op evaluation,  At surgeon's request and post-op pain management  Laterality: Right  Prep: chloraprep        Narrative:  Start time: 06/02/2016 1:41 PM End time: 06/02/2016 1:42 PM  Events:,,,,,,,,,,, other event

## 2016-06-02 NOTE — Op Note (Signed)
Dictation Number [id number]

## 2016-06-02 NOTE — Discharge Instructions (Signed)

## 2016-06-02 NOTE — Anesthesia Procedure Notes (Signed)
Procedure Name: MAC Date/Time: 06/02/2016 1:38 PM Performed by: Marrianne Mood Pre-anesthesia Checklist: Patient identified, Timeout performed, Emergency Drugs available, Suction available and Patient being monitored Patient Re-evaluated:Patient Re-evaluated prior to inductionOxygen Delivery Method: Simple face mask

## 2016-06-02 NOTE — Anesthesia Preprocedure Evaluation (Signed)
Anesthesia Evaluation  Patient identified by MRN, date of birth, ID band Patient awake    Reviewed: Allergy & Precautions, NPO status , Patient's Chart, lab work & pertinent test results  History of Anesthesia Complications (+) PONV and history of anesthetic complications  Airway Mallampati: II  TM Distance: >3 FB Neck ROM: Full    Dental no notable dental hx.    Pulmonary neg pulmonary ROS, former smoker,    Pulmonary exam normal breath sounds clear to auscultation       Cardiovascular hypertension, Pt. on medications Normal cardiovascular exam Rhythm:Regular Rate:Normal     Neuro/Psych negative neurological ROS  negative psych ROS   GI/Hepatic negative GI ROS, Neg liver ROS,   Endo/Other  negative endocrine ROS  Renal/GU negative Renal ROS     Musculoskeletal negative musculoskeletal ROS (+)   Abdominal   Peds  Hematology negative hematology ROS (+)   Anesthesia Other Findings   Reproductive/Obstetrics negative OB ROS                             Anesthesia Physical  Anesthesia Plan  ASA: II  Anesthesia Plan: Bier Block   Post-op Pain Management:    Induction: Intravenous  Airway Management Planned:   Additional Equipment:   Intra-op Plan:   Post-operative Plan:   Informed Consent: I have reviewed the patients History and Physical, chart, labs and discussed the procedure including the risks, benefits and alternatives for the proposed anesthesia with the patient or authorized representative who has indicated his/her understanding and acceptance.   Dental advisory given  Plan Discussed with: CRNA  Anesthesia Plan Comments:         Anesthesia Quick Evaluation

## 2016-06-02 NOTE — Brief Op Note (Signed)
06/02/2016  2:18 PM  PATIENT:  Sheila Livingston  71 y.o. female  PRE-OPERATIVE DIAGNOSIS:  MELANOMA RIGHT THUMB  POST-OPERATIVE DIAGNOSIS:  Melanoma right thumb  PROCEDURE:  Procedure(s): AMPUTATION RIGHT THUMB INTERPHALANGEAL JOINT (Right)  SURGEON:  Surgeon(s) and Role:    * Daryll Brod, MD - Primary  PHYSICIAN ASSISTANT:   ASSISTANTS: none   ANESTHESIA:   local, regional and IV sedation  EBL:  Total I/O In: 1000 [I.V.:1000] Out: -   BLOOD ADMINISTERED:none  DRAINS: none   LOCAL MEDICATIONS USED:  BUPIVICAINE   SPECIMEN:  Excision  DISPOSITION OF SPECIMEN:  PATHOLOGY  COUNTS:  YES  TOURNIQUET:   Total Tourniquet Time Documented: Forearm (Right) - 27 minutes Total: Forearm (Right) - 27 minutes   DICTATION: .Other Dictation: Dictation Number [id number]  PLAN OF CARE: Discharge to home after PACU  PATIENT DISPOSITION:  PACU - hemodynamically stable.

## 2016-06-02 NOTE — Transfer of Care (Signed)
Immediate Anesthesia Transfer of Care Note  Patient: Sheila Livingston  Procedure(s) Performed: Procedure(s): AMPUTATION RIGHT THUMB INTERPHALANGEAL JOINT (Right)  Patient Location: PACU  Anesthesia Type:Bier block  Level of Consciousness: awake  Airway & Oxygen Therapy: Patient Spontanous Breathing  Post-op Assessment: Report given to RN and Post -op Vital signs reviewed and stable  Post vital signs: Reviewed and stable  Last Vitals:  Vitals:   06/02/16 1247 06/02/16 1415  BP: (!) 142/67 (!) (P) 156/90  Pulse: 62   Resp: 16   Temp: 36.8 C (P) 36.5 C    Last Pain:  Vitals:   06/02/16 1247  TempSrc: Oral  PainSc: 0-No pain      Patients Stated Pain Goal: 3 (44/03/47 4259)  Complications: No apparent anesthesia complications

## 2016-06-02 NOTE — H&P (Signed)
Sheila Livingston is an 71 y.o. female.   Chief Complaint: melanoma right thumb HPI: Sheila Livingston is a 70 yo female who had a biopsy of her right thumb for a pigmented nailbed lesion. Pathology is melanoma. She comes in for definitive treatment.  Past Medical History:  Diagnosis Date  . Allergy   . Anal fissure   . Arthritis    hands  . Benign paroxysmal positional vertigo   . Complication of anesthesia   . Endometriosis   . Hemorrhoids   . History of skin cancer   . Hyperlipidemia   . Hyperplastic colon polyp   . Hypertension   . IBS (irritable bowel syndrome)   . PONV (postoperative nausea and vomiting)     Past Surgical History:  Procedure Laterality Date  . ABDOMINAL HYSTERECTOMY     bso enometriosis; no cancer  . BUNIONECTOMY Left 11/23/2008  . NAILBED REPAIR Right 05/12/2016   Procedure: EXCISION OF NAIL MATRIX BED RIGHT;  Surgeon: Daryll Brod, MD;  Location: [facility];  Service: Orthopedics;  Laterality: Right;  . ROTATOR CUFF REPAIR Right 10/2009  . SKIN FULL THICKNESS GRAFT Right 05/12/2016   Procedure: UPPER ARM SKIN GRAFT RIGHT THUMB;  Surgeon: Daryll Brod, MD;  Location: [facility];  Service: Orthopedics;  Laterality: Right;  . thumb nail surgery Right     Family History  Problem Relation Age of Onset  . Diabetes Mother   . Stroke Mother   . Breast cancer Sister   . Colon polyps Sister   . Diabetes Sister   . Colon polyps Sister   . Colon cancer Neg Hx    Social History:  reports that she has quit smoking. She has never used smokeless tobacco. She reports that she drinks alcohol. She reports that she does not use drugs.  Allergies:  Allergies  Allergen Reactions  . Imodium [Loperamide] Other (See Comments)  . Flonase [Fluticasone Propionate]   . Fluticasone Propionate     REACTION: Facial swelling under eyes nose bleeds  . Lisinopril     REACTION: dizziness and light headed  . Loperamide Hcl     REACTION: passed out  . Other Other  (See Comments)    Hummus? Swelling of face  . Benadryl [Diphenhydramine] Anxiety    No prescriptions prior to admission.    No results found for this or any previous visit (from the past 48 hour(s)).  No results found.   Pertinent items are noted in HPI.  Height 5\' 4"  (1.626 m), weight 61.7 kg (136 lb).  General appearance: alert, cooperative and appears stated age Head: Normocephalic, without obvious abnormality Neck: no JVD Resp: clear to auscultation bilaterally Cardio: regular rate and rhythm, S1, S2 normal, no murmur, click, rub or gallop GI: soft, non-tender; bowel sounds normal; no masses,  no organomegaly Extremities: melanoma right thumb Pulses: 2+ and symmetric Skin: Skin color, texture, turgor normal. No rashes or lesions Neurologic: Grossly normal  incsion/Wound: graft thumb nail right  Assessment/Plan She is seeing Dr. Hassell Done feels that if this is in situ a sentinel no does not need to be done. Recommends going ahead with the amputation of her thumb. If this changes we will be in contact with him we will have Sheila Livingston contact him to be certain that this is canceled for a sentinel node. We will schedule for amputation of her right thumb as an outpatient under regional anesthesia. There postoperative course are discussed along with risk complications. He is scheduled as an  outpatient under regional anesthesia.      Yahaira Bruski R 06/02/2016, 11:00 AM

## 2016-06-03 ENCOUNTER — Encounter (HOSPITAL_BASED_OUTPATIENT_CLINIC_OR_DEPARTMENT_OTHER): Payer: Self-pay | Admitting: Orthopedic Surgery

## 2016-06-03 NOTE — Op Note (Signed)
NAMEEMAN, RYNDERS NO.:  [id number]  MEDICAL RECORD NO.:  [id number]  LOCATION:                                 FACILITY:  PHYSICIAN:  Daryll Brod, [name]            DATE OF BIRTH:  DATE OF PROCEDURE:  06/02/2016 DATE OF DISCHARGE:                              OPERATIVE REPORT   PREOPERATIVE DIAGNOSIS:  Melanoma, right thumb nail matrix.  POSTOPERATIVE DIAGNOSIS:  Melanoma, right thumb nail matrix.  OPERATION:  Amputation, right thumb distal phalanx.  SURGEON:  Daryll Brod, MD.  ASSISTANT:  None.  ANESTHESIA:  Forearm IV regional sedation and metacarpal block.  ANESTHESIOLOGISTSabra Heck.  PLACE OF SURGERY:  [facility].  HISTORY:  The patient is a 71 year old female, who developed a blackened nail of her right thumb.  She underwent biopsy excision of the entire dorsal nail plate with full-thickness skin grafting, pathology report has come back as malignant melanoma.  She is admitted now for amputation through the distal interphalangeal joint with a volar flap rotation. Pre, peri, and postoperative course have been discussed along with risks and complications.  She is aware there is no guarantee to the surgery; the possibility of infection; recurrence of injury to arteries, nerves, tendons; incomplete relief of symptoms; dystrophy.  In the preoperative area, the patient was seen, the extremity marked by both patient and surgeon.  Antibiotic given.  PROCEDURE IN DETAIL:  The patient was brought to the operating room, where a forearm IV regional anesthetic was carried out without difficulty.  She was prepped using ChloraPrep in a supine position with the right arm free.  A 3-minute dry time was allowed and time-out taken, confirming the patient and procedure.  The metacarpal block was given 0.25% bupivacaine without epinephrine, approximately 9 mL was used.  The area of amputation was marked allowing a volar flap of the pulp distant from her  nail matrix.  This was incised.  The extensor tendon was transected allowing visualization of the interphalangeal joint.  The volar flap was created to allow coverage of the proximal phalanx.  The flexor tendon was transected, a large sesamoid was present, this was excised.  The cartilage was then removed along with the condyles from the proximal phalanx.  The wound was copiously irrigated with saline. The specimen was sent to Pathology.  A further removal of tissue was performed from the tip of the volar flap after this was rotated into position.  This was then sutured into position with interrupted 4-0 nylon sutures covering the distal portion of the proximal phalanx.  The specimen was sent to Pathology.  A sterile compressive dressing and splint to the finger was applied.  On deflation of the tourniquet, all fingers immediately pinked.  She was taken to the recovery room for observation in satisfactory condition.  She will be discharged to home to return to the [facility] in 1 week, on [place].          ______________________________ Daryll Brod, [name]     GK/MEDQ  D:  06/02/2016  T:  06/03/2016  Job:  [id number]

## 2016-06-03 NOTE — Addendum Note (Signed)
Addendum  created 06/03/16 1237 by Tawni Millers, CRNA   Charge Capture section accepted

## 2016-06-10 DIAGNOSIS — C439 Malignant melanoma of skin, unspecified: Secondary | ICD-10-CM

## 2016-06-10 HISTORY — DX: Malignant melanoma of skin, unspecified: C43.9

## 2016-08-26 DIAGNOSIS — L814 Other melanin hyperpigmentation: Secondary | ICD-10-CM | POA: Diagnosis not present

## 2016-08-26 DIAGNOSIS — D1801 Hemangioma of skin and subcutaneous tissue: Secondary | ICD-10-CM | POA: Diagnosis not present

## 2016-08-26 DIAGNOSIS — L570 Actinic keratosis: Secondary | ICD-10-CM | POA: Diagnosis not present

## 2016-08-26 DIAGNOSIS — L821 Other seborrheic keratosis: Secondary | ICD-10-CM | POA: Diagnosis not present

## 2016-08-26 DIAGNOSIS — Z85820 Personal history of malignant melanoma of skin: Secondary | ICD-10-CM | POA: Diagnosis not present

## 2016-09-08 DIAGNOSIS — H5203 Hypermetropia, bilateral: Secondary | ICD-10-CM | POA: Diagnosis not present

## 2016-09-08 DIAGNOSIS — H40013 Open angle with borderline findings, low risk, bilateral: Secondary | ICD-10-CM | POA: Diagnosis not present

## 2016-10-27 DIAGNOSIS — L820 Inflamed seborrheic keratosis: Secondary | ICD-10-CM | POA: Diagnosis not present

## 2016-10-27 DIAGNOSIS — D485 Neoplasm of uncertain behavior of skin: Secondary | ICD-10-CM | POA: Diagnosis not present

## 2016-10-27 DIAGNOSIS — L409 Psoriasis, unspecified: Secondary | ICD-10-CM | POA: Diagnosis not present

## 2016-11-09 ENCOUNTER — Encounter: Payer: Self-pay | Admitting: Family Medicine

## 2016-11-09 LAB — SKIN BIOPSY

## 2016-11-30 DIAGNOSIS — D1801 Hemangioma of skin and subcutaneous tissue: Secondary | ICD-10-CM | POA: Diagnosis not present

## 2016-11-30 DIAGNOSIS — Z85828 Personal history of other malignant neoplasm of skin: Secondary | ICD-10-CM | POA: Diagnosis not present

## 2016-11-30 DIAGNOSIS — L400 Psoriasis vulgaris: Secondary | ICD-10-CM | POA: Diagnosis not present

## 2016-11-30 DIAGNOSIS — L821 Other seborrheic keratosis: Secondary | ICD-10-CM | POA: Diagnosis not present

## 2016-11-30 DIAGNOSIS — L814 Other melanin hyperpigmentation: Secondary | ICD-10-CM | POA: Diagnosis not present

## 2016-12-16 ENCOUNTER — Ambulatory Visit (INDEPENDENT_AMBULATORY_CARE_PROVIDER_SITE_OTHER): Payer: Medicare Other | Admitting: Internal Medicine

## 2016-12-16 ENCOUNTER — Encounter: Payer: Self-pay | Admitting: Internal Medicine

## 2016-12-16 VITALS — BP 142/80 | HR 75 | Temp 97.9°F | Wt 143.2 lb

## 2016-12-16 DIAGNOSIS — J069 Acute upper respiratory infection, unspecified: Secondary | ICD-10-CM

## 2016-12-16 DIAGNOSIS — J329 Chronic sinusitis, unspecified: Secondary | ICD-10-CM

## 2016-12-16 DIAGNOSIS — Z23 Encounter for immunization: Secondary | ICD-10-CM

## 2016-12-16 MED ORDER — AMOXICILLIN 500 MG PO CAPS: 500.0000 mg | ORAL_CAPSULE | Freq: Three times a day (TID) | ORAL | 0 refills | Status: DC

## 2016-12-16 NOTE — Addendum Note (Signed)
Addended by: Virl Cagey on: 12/16/2016 04:52 PM   Modules accepted: Orders

## 2016-12-16 NOTE — Progress Notes (Signed)
Chief Complaint  Patient presents with  . Acute Visit    Cough, sore throat, head and chest congestion x 3 weeks. Fever (100+) started 11/30/16 and all other symptoms started about 1 week later. Clear mucus from chest and head. No OTC tx    HPI: Sheila Livingston 71 y.o.   Onset   On nov 4th sore throat   And then as above  No quite away . Face tight  Despite deongestants  Too dry .    And facial pressure in 3rd week   Cough no sob  Using   aire saline spray   And [employer] robittussin dm  Has deviated septum  husband had similar illness but got bronchitis     ROS: See pertinent positives and negatives per HPI. See hx of  distal amputation right thumb for cancer   Doing well .  Melanoma in situ  Past Medical History:  Diagnosis Date  . Allergy   . Anal fissure   . Arthritis    hands  . Benign paroxysmal positional vertigo   . Complication of anesthesia   . Endometriosis   . Hemorrhoids   . History of skin cancer   . Hyperlipidemia   . Hyperplastic colon polyp   . Hypertension   . IBS (irritable bowel syndrome)   . Melanoma ([facility]) 06/10/2016   right thumb   . PONV (postoperative nausea and vomiting)     Family History  Problem Relation Age of Onset  . Diabetes Mother   . Stroke Mother   . Breast cancer Sister   . Colon polyps Sister   . Diabetes Sister   . Colon polyps Sister   . Colon cancer Neg Hx     Social History   Socioeconomic History  . Marital status: Married    Spouse name: None  . Number of children: None  . Years of education: None  . Highest education level: None  Social Needs  . Financial resource strain: None  . Food insecurity - worry: None  . Food insecurity - inability: None  . Transportation needs - medical: None  . Transportation needs - non-medical: None  Occupational History  . None  Tobacco Use  . Smoking status: Former Research scientist (life sciences)  . Smokeless tobacco: Never Used  Substance and Sexual Activity  . Alcohol use: Yes    Alcohol/week: 0.0 oz    Comment: rare  . Drug use: No  . Sexual activity: None  Other Topics Concern  . 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    Outpatient Medications Prior to Visit  Medication Sig Dispense Refill  . aspirin 81 MG chewable tablet Chew 81 mg by mouth daily.      . Cholecalciferol (VITAMIN D3) 400 UNITS CAPS Take 1 capsule by mouth daily.    Marland Kitchen docusate sodium (COLACE) 100 MG capsule Take 100 mg by mouth 2 (two) times daily.    . Flaxseed, Linseed, (FLAX SEED OIL PO) Take by mouth.      . losartan (COZAAR) 50 MG tablet TAKE 1 TABLET BY MOUTH  DAILY 90 tablet 0  . LYSINE PO Take  by mouth.    . MULTIPLE VITAMIN PO Take by mouth.      . Omega-3 Fatty Acids (FISH OIL PO) Take by mouth.    . triamcinolone cream (KENALOG) 0.1 % Apply 1 application topically 2 (two) times daily.     No facility-administered medications prior to visit.      EXAM:  BP (!) 142/80 (BP Location: Right Arm, Patient Position: Sitting, Cuff Size: Normal)   Pulse 75   Temp 97.9 F (36.6 C) (Oral)   Wt 143 lb 3.2 oz (65 kg)   BMI 24.58 kg/m   Body mass index is 24.58 kg/m. WDWN in NAD  quiet respirations; mildly congested  somewhat hoarse. Non toxic . HEENT: Normocephalic ;atraumatic , Eyes;  PERRL, EOMs  Full, lids and conjunctiva clear,,Ears: no deformities, canals nl, TM landmarks normal, Nose: no deformity or discharge deviated septum  but congested;face minimally tender over sinus area  Mouth : OP clear without lesion or edema . Neck: Supple without adenopathy or masses or bruits Chest:  Clear to A&P without wheezes rales or rhonchi CV:  S1-S2 no gallops or murmurs peripheral perfusion is normal Skin :nl perfusion and no acute rashes     ASSESSMENT AND PLAN:  Discussed  the following assessment and plan:  Sinusitis, unspecified chronicity, unspecified location  Protracted URI  Third week plus of illness with persistent sinus pressure and symptoms.  Had bleeding with Flonase but can try Nasacort continue saline add antibiotic empirically expectant management Lung exam is reassuring today.  -Patient advised to return or notify health care team  if symptoms worsen ,persist or new concerns arise.  Patient Instructions  Chest exam is good reassuring.  Try nasacort  Nasal cortisone .     With caution and saline   Add antibiotic for  Sinusitis   Expect improvement   In 3-5 days .  Fu in fever recurrrent sx  Etc.     Sinusitis, Adult Sinusitis is soreness and inflammation of your sinuses. Sinuses are hollow spaces in the bones around your face. Your sinuses are located:  Around your eyes.  In the middle of your forehead.  Behind your nose.  In your cheekbones.  Your sinuses and nasal passages are lined with a stringy fluid (mucus). Mucus normally drains out of your sinuses. When your nasal tissues become inflamed or swollen, the mucus can become trapped or blocked so air cannot flow through your sinuses. This allows bacteria, viruses, and funguses to grow, which leads to infection. Sinusitis can develop quickly and last for 7?10 days (acute) or for more than 12 weeks (chronic). Sinusitis often develops after a cold. What are the causes? This condition is caused by anything that creates swelling in the sinuses or stops mucus from draining, including:  Allergies.  Asthma.  Bacterial or viral infection.  Abnormally shaped bones between the nasal passages.  Nasal growths that contain mucus (nasal polyps).  Narrow sinus openings.  Pollutants, such as chemicals or irritants in the air.  A foreign object stuck in the nose.  A fungal infection. This is rare.  What increases the risk? The following factors may make you more likely to develop  this condition:  Having allergies or asthma.  Having had a recent cold or respiratory tract infection.  Having structural deformities or blockages in your nose or sinuses.  Having a weak immune system.  Doing a lot of swimming or diving.  Overusing nasal sprays.  Smoking.  What are the signs or symptoms?  The main symptoms of this condition are pain and a feeling of pressure around the affected sinuses. Other symptoms include:  Upper toothache.  Earache.  Headache.  Bad breath.  Decreased sense of smell and taste.  A cough that may get worse at night.  Fatigue.  Fever.  Thick drainage from your nose. The drainage is often green and it may contain pus (purulent).  Stuffy nose or congestion.  Postnasal drip. This is when extra mucus collects in the throat or back of the nose.  Swelling and warmth over the affected sinuses.  Sore throat.  Sensitivity to light.  How is this diagnosed? This condition is diagnosed based on symptoms, a medical history, and a physical exam. To find out if your condition is acute or chronic, your health care provider may:  Look in your nose for signs of nasal polyps.  Tap over the affected sinus to check for signs of infection.  View the inside of your sinuses using an imaging device that has a light attached (endoscope).  If your health care provider suspects that you have chronic sinusitis, you may also:  Be tested for allergies.  Have a sample of mucus taken from your nose (nasal culture) and checked for bacteria.  Have a mucus sample examined to see if your sinusitis is related to an allergy.  If your sinusitis does not respond to treatment and it lasts longer than 8 weeks, you may have an MRI or CT scan to check your sinuses. These scans also help to determine how severe your infection is. In rare cases, a bone biopsy may be done to rule out more serious types of fungal sinus disease. How is this treated? Treatment for  sinusitis depends on the cause and whether your condition is chronic or acute. If a virus is causing your sinusitis, your symptoms will go away on their own within 10 days. You may be given medicines to relieve your symptoms, including:  Topical nasal decongestants. They shrink swollen nasal passages and let mucus drain from your sinuses.  Antihistamines. These drugs block inflammation that is triggered by allergies. This can help to ease swelling in your nose and sinuses.  Topical nasal corticosteroids. These are nasal sprays that ease inflammation and swelling in your nose and sinuses.  Nasal saline washes. These rinses can help to get rid of thick mucus in your nose.  If your condition is caused by bacteria, you will be given an antibiotic medicine. If your condition is caused by a fungus, you will be given an antifungal medicine. Surgery may be needed to correct underlying conditions, such as narrow nasal passages. Surgery may also be needed to remove polyps. Follow these instructions at home: Medicines  Take, use, or apply over-the-counter and prescription medicines only as told by your health care provider. These may include nasal sprays.  If you were prescribed an antibiotic medicine, take it as told by your health care provider. Do not stop taking the antibiotic even if you start to feel better. Hydrate and Humidify  Drink enough water to keep your urine clear or pale yellow. Staying hydrated will help to thin your mucus.  Use a cool mist humidifier to keep the humidity level in your home above 50%.  Inhale steam for 10-15 minutes, 3-4 times a day or as told by your health care provider. You can do this in the bathroom while a hot shower is running.  Limit your exposure to cool or dry air. Rest  Rest as  much as possible.  Sleep with your head raised (elevated).  Make sure to get enough sleep each night. General instructions  Apply a warm, moist washcloth to your face 3-4  times a day or as told by your health care provider. This will help with discomfort.  Wash your hands often with soap and water to reduce your exposure to viruses and other germs. If soap and water are not available, use hand sanitizer.  Do not smoke. Avoid being around people who are smoking (secondhand smoke).  Keep all follow-up visits as told by your health care provider. This is important. Contact a health care provider if:  You have a fever.  Your symptoms get worse.  Your symptoms do not improve within 10 days. Get help right away if:  You have a severe headache.  You have persistent vomiting.  You have pain or swelling around your face or eyes.  You have vision problems.  You develop confusion.  Your neck is stiff.  You have trouble breathing. This information is not intended to replace advice given to you by your health care provider. Make sure you discuss any questions you have with your health care provider. Document Released: 01/12/2005 Document Revised: 09/08/2015 Document Reviewed: 11/07/2014 Elsevier Interactive Patient Education  2017 [place] K. Cardale Dorer [name]

## 2016-12-16 NOTE — Patient Instructions (Addendum)
Chest exam is good reassuring.  Try nasacort  Nasal cortisone .     With caution and saline   Add antibiotic for  Sinusitis   Expect improvement   In 3-5 days .  Fu in fever recurrrent sx  Etc.     Sinusitis, Adult Sinusitis is soreness and inflammation of your sinuses. Sinuses are hollow spaces in the bones around your face. Your sinuses are located:  Around your eyes.  In the middle of your forehead.  Behind your nose.  In your cheekbones.  Your sinuses and nasal passages are lined with a stringy fluid (mucus). Mucus normally drains out of your sinuses. When your nasal tissues become inflamed or swollen, the mucus can become trapped or blocked so air cannot flow through your sinuses. This allows bacteria, viruses, and funguses to grow, which leads to infection. Sinusitis can develop quickly and last for 7?10 days (acute) or for more than 12 weeks (chronic). Sinusitis often develops after a cold. What are the causes? This condition is caused by anything that creates swelling in the sinuses or stops mucus from draining, including:  Allergies.  Asthma.  Bacterial or viral infection.  Abnormally shaped bones between the nasal passages.  Nasal growths that contain mucus (nasal polyps).  Narrow sinus openings.  Pollutants, such as chemicals or irritants in the air.  A foreign object stuck in the nose.  A fungal infection. This is rare.  What increases the risk? The following factors may make you more likely to develop this condition:  Having allergies or asthma.  Having had a recent cold or respiratory tract infection.  Having structural deformities or blockages in your nose or sinuses.  Having a weak immune system.  Doing a lot of swimming or diving.  Overusing nasal sprays.  Smoking.  What are the signs or symptoms? The main symptoms of this condition are pain and a feeling of pressure around the affected sinuses. Other symptoms include:  Upper  toothache.  Earache.  Headache.  Bad breath.  Decreased sense of smell and taste.  A cough that may get worse at night.  Fatigue.  Fever.  Thick drainage from your nose. The drainage is often green and it may contain pus (purulent).  Stuffy nose or congestion.  Postnasal drip. This is when extra mucus collects in the throat or back of the nose.  Swelling and warmth over the affected sinuses.  Sore throat.  Sensitivity to light.  How is this diagnosed? This condition is diagnosed based on symptoms, a medical history, and a physical exam. To find out if your condition is acute or chronic, your health care provider may:  Look in your nose for signs of nasal polyps.  Tap over the affected sinus to check for signs of infection.  View the inside of your sinuses using an imaging device that has a light attached (endoscope).  If your health care provider suspects that you have chronic sinusitis, you may also:  Be tested for allergies.  Have a sample of mucus taken from your nose (nasal culture) and checked for bacteria.  Have a mucus sample examined to see if your sinusitis is related to an allergy.  If your sinusitis does not respond to treatment and it lasts longer than 8 weeks, you may have an MRI or CT scan to check your sinuses. These scans also help to determine how severe your infection is. In rare cases, a bone biopsy may be done to rule out more serious types of  fungal sinus disease. How is this treated? Treatment for sinusitis depends on the cause and whether your condition is chronic or acute. If a virus is causing your sinusitis, your symptoms will go away on their own within 10 days. You may be given medicines to relieve your symptoms, including:  Topical nasal decongestants. They shrink swollen nasal passages and let mucus drain from your sinuses.  Antihistamines. These drugs block inflammation that is triggered by allergies. This can help to ease swelling in  your nose and sinuses.  Topical nasal corticosteroids. These are nasal sprays that ease inflammation and swelling in your nose and sinuses.  Nasal saline washes. These rinses can help to get rid of thick mucus in your nose.  If your condition is caused by bacteria, you will be given an antibiotic medicine. If your condition is caused by a fungus, you will be given an antifungal medicine. Surgery may be needed to correct underlying conditions, such as narrow nasal passages. Surgery may also be needed to remove polyps. Follow these instructions at home: Medicines  Take, use, or apply over-the-counter and prescription medicines only as told by your health care provider. These may include nasal sprays.  If you were prescribed an antibiotic medicine, take it as told by your health care provider. Do not stop taking the antibiotic even if you start to feel better. Hydrate and Humidify  Drink enough water to keep your urine clear or pale yellow. Staying hydrated will help to thin your mucus.  Use a cool mist humidifier to keep the humidity level in your home above 50%.  Inhale steam for 10-15 minutes, 3-4 times a day or as told by your health care provider. You can do this in the bathroom while a hot shower is running.  Limit your exposure to cool or dry air. Rest  Rest as much as possible.  Sleep with your head raised (elevated).  Make sure to get enough sleep each night. General instructions  Apply a warm, moist washcloth to your face 3-4 times a day or as told by your health care provider. This will help with discomfort.  Wash your hands often with soap and water to reduce your exposure to viruses and other germs. If soap and water are not available, use hand sanitizer.  Do not smoke. Avoid being around people who are smoking (secondhand smoke).  Keep all follow-up visits as told by your health care provider. This is important. Contact a health care provider if:  You have a  fever.  Your symptoms get worse.  Your symptoms do not improve within 10 days. Get help right away if:  You have a severe headache.  You have persistent vomiting.  You have pain or swelling around your face or eyes.  You have vision problems.  You develop confusion.  Your neck is stiff.  You have trouble breathing. This information is not intended to replace advice given to you by your health care provider. Make sure you discuss any questions you have with your health care provider. Document Released: 01/12/2005 Document Revised: 09/08/2015 Document Reviewed: 11/07/2014 Elsevier Interactive Patient Education  2017 [employer].

## 2017-02-01 NOTE — Progress Notes (Signed)
Chief Complaint  Patient presents with  . Annual Exam    Discuss medications/vits. Pt having left foot problem.     HPI: Sheila Livingston 72 y.o. comes in today for Preventive Medicare exam/. And med eval    ? Should take Fo  flaxeed no  Specific dx   Left foot feels like band at times mid foot  No inury has had foot surgery on left  No change in exercise   2015 last dex  fam hx of  Being on folsmax type meds   No fractures   Bp controlled   Hx melanoma sees derm q 3 mos.  Lichen sclerosis  On meds  Sees gyne  At this time  Health Maintenance  Topic Date Due  . Hepatitis C Screening  April 27, 1945  . TETANUS/TDAP  01/27/2015  . MAMMOGRAM  04/10/2018  . COLONOSCOPY  08/11/2022  . INFLUENZA VACCINE  Completed  . DEXA SCAN  Completed  . PNA vac Low Risk Adult  Completed   Health Maintenance Review LIFESTYLE:  Exercise:   y 3 d per week.  Shoulder  Tobacco/ETS: no Alcohol:   ocass  Sugar beverages: no Sleep:   6-7  Drug use: no    HH:  2  No pets     Hearing:   So so    Vision:  No limitations at present . Last eye check UTD  Safety:  Has smoke detector and wears seat belts.  . No excess sun exposure. Sees dentist regularly.  Falls:   no  Memory: Felt to be good  , no concern from her or her family.  Depression: No anhedonia unusual crying or depressive symptoms  Nutrition: Eats well balanced diet; adequate calcium and vitamin D. No swallowing chewing problems.  Injury: no major injuries in the last six months.  Other healthcare providers:  Reviewed today .  Social:  Lives with spouse married. No pets.   Preventive parameters: up-to-date  Reviewed   ADLS:   There are no problems or need for assistance  driving, feeding, obtaining food, dressing, toileting and bathing, managing money using phone. She is independent.   ROS:  See HPI GEN/ HEENT: No fever, significant weight changes sweats headaches vision problems hearing changes, CV/ PULM; No chest pain  shortness of breath cough, syncope,edema  change in exercise tolerance. GI /GU: No adominal pain, vomiting, change in bowel habits. No blood in the stool. No significant GU symptoms. SKIN/HEME: ,no acute skin rashes suspicious lesions or bleeding. No lymphadenopathy, nodules, masses.  NEURO/ PSYCH:  No neurologic signs such as weakness numbness. No depression anxiety. IMM/ Allergy: No unusual infections.  Allergy .   REST of 12 system review negative except as per HPI   Past Medical History:  Diagnosis Date  . Allergy   . Anal fissure   . Arthritis    hands  . Benign paroxysmal positional vertigo   . Complication of anesthesia   . Endometriosis   . Hemorrhoids   . History of skin cancer   . Hyperlipidemia   . Hyperplastic colon polyp   . Hypertension   . IBS (irritable bowel syndrome)   . Melanoma ([place]) 06/10/2016   right thumb   . PONV (postoperative nausea and vomiting)     Family History  Problem Relation Age of Onset  . Diabetes Mother   . Stroke Mother   . Breast cancer Sister   . Colon polyps Sister   . Diabetes Sister   .  Colon polyps Sister   . Colon cancer Neg Hx     Social History   Socioeconomic History  . Marital status: Married    Spouse name: None  . Number of children: None  . Years of education: None  . Highest education level: None  Social Needs  . Financial resource strain: None  . Food insecurity - worry: None  . Food insecurity - inability: None  . Transportation needs - medical: None  . Transportation needs - non-medical: None  Occupational History  . None  Tobacco Use  . Smoking status: Former Research scientist (life sciences)  . Smokeless tobacco: Never Used  Substance and Sexual Activity  . Alcohol use: Yes    Alcohol/week: 0.0 oz    Comment: rare  . Drug use: No  . Sexual activity: None  Other Topics Concern  . 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    Outpatient Encounter Medications as of 02/02/2017  Medication Sig  . aspirin 81 MG chewable tablet Chew 81 mg by mouth daily.    . Cholecalciferol (VITAMIN D3) 400 UNITS CAPS Take 1 capsule by mouth daily.  Marland Kitchen docusate sodium (COLACE) 100 MG capsule Take 100 mg by mouth 2 (two) times daily.  . Flaxseed, Linseed, (FLAX SEED OIL PO) Take by mouth.    . losartan (COZAAR) 50 MG tablet TAKE 1 TABLET BY MOUTH  DAILY  . LYSINE PO Take by mouth.  . MULTIPLE VITAMIN PO Take by mouth.    . Omega-3 Fatty Acids (FISH OIL PO) Take by mouth.  . triamcinolone cream (KENALOG) 0.1 % Apply 1 application topically 2 (two) times daily.  . [DISCONTINUED] amoxicillin (AMOXIL) 500 MG capsule Take 1 capsule (500 mg total) by mouth 3 (three) times daily. (Patient not taking: Reported on 02/02/2017)   No facility-administered encounter medications on file as of 02/02/2017.     EXAM:  BP 130/72 (BP Location: Right Arm, Patient Position: Sitting, Cuff Size: Normal)   Pulse 61   Temp 98.3 F (36.8 C) (Oral)   Ht 5' 4.5" (1.638 m)   Wt 143 lb (64.9 kg)   BMI 24.17 kg/m   Body mass index is 24.17 kg/m.  Physical Exam: Vital signs reviewed WIO:XBDZ is a well-developed well-nourished alert cooperative   who appears stated age in no acute distress.  HEENT: normocephalic atraumatic , Eyes: PERRL EOM's full, conjunctiva clear, Nares: paten,t no deformity discharge or tenderness., Ears: no deformity EAC's clear TMs with normal landmarks. Mouth: clear OP, no lesions, edema.  Moist mucous membranes. Dentition in adequate repair. NECK: supple without masses, thyromegaly or bruits. CHEST/PULM:  Clear to auscultation and percussion breath sounds equal no wheeze , rales or rhonchi. No chest Kehres deformities or tenderness. CV: PMI is nondisplaced, S1 S2 no gallops, murmurs, rubs. Peripheral  pulses are full without delay.No JVD . Breast: normal by inspection . No dimpling, discharge, masses, tenderness or discharge . ABDOMEN: Bowel sounds normal nontender  No guard or rebound, no hepato splenomegal no CVA tenderness.   Extremtities:  No clubbing cyanosis or edema, no acute joint swelling or redness no focal atrophy left  forrt nl pulse  cytic lesion under ball of foot  Nl skin no ulcer  moissing  Right thumg distal  NEURO:  Oriented x3, cranial nerves 3-12 appear to be intact, no obvious focal weakness,gait within normal limits no abnormal reflexes or asymmetrical SKIN: No acute rashes normal turgor, color, no bruising or petechiae. PSYCH: Oriented, good eye contact, no obvious depression anxiety, cognition and judgment appear normal. LN: no cervical axillary inguinal adenopathy No noted deficits in memory, attention, and speech.     ASSESSMENT AND PLAN:  Discussed the following assessment and plan:  Visit for preventive health examination - Plan: Basic metabolic panel, CBC with Differential/Platelet, Hepatic function panel, Lipid panel  Essential hypertension - Plan: Basic metabolic panel, CBC with Differential/Platelet, Hepatic function panel, Lipid panel  Hyperlipidemia, unspecified hyperlipidemia type - Plan: Basic metabolic panel, CBC with Differential/Platelet, Hepatic function panel, Lipid panel  Estrogen deficiency - Plan: Basic metabolic panel, CBC with Differential/Platelet, Hepatic function panel, Lipid panel  Medication management - Plan: Basic metabolic panel, CBC with Differential/Platelet, Hepatic function panel, Lipid panel  Anogenital lichen sclerosus  Hx of melanoma of skin  Family history of osteoporosis Stay with gyne exam for now.  No compelling reason for fo per se . Disc about options   Patient Care Team: Panosh, Standley Brooking, MD as PCP - General (Internal Medicine) Sydnee Cabal, MD (Orthopedic Surgery) amy Menton  Linda Hedges, DO as Consulting  Physician (Obstetrics and Gynecology) Daryll Brod, MD as Consulting Physician (Orthopedic Surgery) Johnathan Hausen, MD as Consulting Physician (General Surgery) Ayesha Mohair (Dermatology)  Patient Instructions  Will notify you  of labs when available.  Make appt bone density at your mammo   Order rx for dexa .   Continue lifestyle intervention healthy eating and exercise .  No compelling reason for fish oil etc  But foods with omega 3 and  Flaxseed are heart healthy and  Are advised .  Get hearing  Evaluation if problematic   Hearing on going.     Preventive Care 42 Years and Older, Female Preventive care refers to lifestyle choices and visits with your health care provider that can promote health and wellness. What does preventive care include?  A yearly physical exam. This is also called an annual well check.  Dental exams once or twice a year.  Routine eye exams. Ask your health care provider how often you should have your eyes checked.  Personal lifestyle choices, including: ? Daily care of your teeth and gums. ? Regular physical activity. ? Eating a healthy diet. ? Avoiding tobacco and drug use. ? Limiting alcohol use. ? Practicing safe sex. ? Taking low-dose aspirin every day. ? Taking vitamin and mineral supplements as recommended by your health care provider. What happens during an annual well check? The services and screenings done by your health care provider during your annual well check will depend on your age, overall health, lifestyle risk factors, and family history of disease. Counseling Your health care provider may ask you questions about your:  Alcohol use.  Tobacco use.  Drug use.  Emotional well-being.  Home and relationship well-being.  Sexual activity.  Eating habits.  History of falls.  Memory and ability to understand (cognition).  Work and work Statistician.  Reproductive health.  Screening You may have the following  tests or measurements:  Height, weight, and BMI.  Blood pressure.  Lipid and cholesterol levels. These may be checked every 5 years, or more frequently if you are over 59 years old.  Skin check.  Lung cancer screening. You may have this screening every year starting at age 8 if you have a 30-pack-year history of smoking and currently smoke or have quit within the past 15 years.  Fecal occult blood test (FOBT) of the stool. You may have this test every year starting at age 31.  Flexible sigmoidoscopy or colonoscopy. You may have a sigmoidoscopy every 5 years or a colonoscopy every 10 years starting at age 47.  Hepatitis C blood test.  Hepatitis B blood test.  Sexually transmitted disease (STD) testing.  Diabetes screening. This is done by checking your blood sugar (glucose) after you have not eaten for a while (fasting). You may have this done every 1-3 years.  Bone density scan. This is done to screen for osteoporosis. You may have this done starting at age 78.  Mammogram. This may be done every 1-2 years. Talk to your health care provider about how often you should have regular mammograms.  Talk with your health care provider about your test results, treatment options, and if necessary, the need for more tests. Vaccines Your health care provider may recommend certain vaccines, such as:  Influenza vaccine. This is recommended every year.  Tetanus, diphtheria, and acellular pertussis (Tdap, Td) vaccine. You may need a Td booster every 10 years.  Varicella vaccine. You may need this if you have not been vaccinated.  Zoster vaccine. You may need this after age 18.  Measles, mumps, and rubella (MMR) vaccine. You may need at least one dose of MMR if you were born in 1957 or later. You may also need a second dose.  Pneumococcal 13-valent conjugate (PCV13) vaccine. One dose is recommended after age 54.  Pneumococcal polysaccharide (PPSV23) vaccine. One dose is recommended after  age 26.  Meningococcal vaccine. You may need this if you have certain conditions.  Hepatitis A vaccine. You may need this if you have certain conditions or if you travel or work in places where you may be exposed to hepatitis A.  Hepatitis B vaccine. You may need this if you have certain conditions or if you travel or work in places where you may be exposed to hepatitis B.  Haemophilus influenzae type b (Hib) vaccine. You may need this if you have certain conditions.  Talk to your health care provider about which screenings and vaccines you need and how often you need them. This information is not intended to replace advice given to you by your health care provider. Make sure you discuss any questions you have with your health care provider. Document Released: 02/08/2015 Document Revised: 10/02/2015 Document Reviewed: 11/13/2014 Elsevier Interactive Patient Education  2018 [place]. Panosh [name]

## 2017-02-02 ENCOUNTER — Ambulatory Visit (INDEPENDENT_AMBULATORY_CARE_PROVIDER_SITE_OTHER): Payer: Medicare HMO | Admitting: Internal Medicine

## 2017-02-02 ENCOUNTER — Encounter: Payer: Self-pay | Admitting: Internal Medicine

## 2017-02-02 VITALS — BP 130/72 | HR 61 | Temp 98.3°F | Ht 64.5 in | Wt 143.0 lb

## 2017-02-02 DIAGNOSIS — Z79899 Other long term (current) drug therapy: Secondary | ICD-10-CM | POA: Diagnosis not present

## 2017-02-02 DIAGNOSIS — I10 Essential (primary) hypertension: Secondary | ICD-10-CM

## 2017-02-02 DIAGNOSIS — E785 Hyperlipidemia, unspecified: Secondary | ICD-10-CM

## 2017-02-02 DIAGNOSIS — Z85820 Personal history of malignant melanoma of skin: Secondary | ICD-10-CM

## 2017-02-02 DIAGNOSIS — L900 Lichen sclerosus et atrophicus: Secondary | ICD-10-CM

## 2017-02-02 DIAGNOSIS — Z8262 Family history of osteoporosis: Secondary | ICD-10-CM

## 2017-02-02 DIAGNOSIS — E2839 Other primary ovarian failure: Secondary | ICD-10-CM | POA: Diagnosis not present

## 2017-02-02 DIAGNOSIS — Z0000 Encounter for general adult medical examination without abnormal findings: Secondary | ICD-10-CM | POA: Diagnosis not present

## 2017-02-02 LAB — BASIC METABOLIC PANEL
BUN: 12 mg/dL (ref 6–23)
CO2: 29 meq/L (ref 19–32)
CREATININE: 0.8 mg/dL (ref 0.40–1.20)
Calcium: 9.9 mg/dL (ref 8.4–10.5)
Chloride: 103 mEq/L (ref 96–112)
GFR: 74.59 mL/min (ref >60.00)
GLUCOSE: 96 mg/dL (ref 70–99)
Potassium: 4.4 mEq/L (ref 3.5–5.1)
SODIUM: 140 meq/L (ref 135–145)

## 2017-02-02 LAB — HEPATIC FUNCTION PANEL
ALK PHOS: 71 U/L (ref 39–117)
ALT: 17 U/L (ref 0–35)
AST: 17 U/L (ref 0–37)
Albumin: 4.4 g/dL (ref 3.5–5.2)
BILIRUBIN DIRECT: 0.1 mg/dL (ref 0.0–0.3)
BILIRUBIN TOTAL: 0.6 mg/dL (ref 0.2–1.2)
Total Protein: 6.6 g/dL (ref 6.0–8.3)

## 2017-02-02 LAB — CBC WITH DIFFERENTIAL/PLATELET
Basophils Absolute: 0 10*3/uL (ref 0.0–0.1)
Basophils Relative: 0.4 % (ref 0.0–3.0)
EOS ABS: 0.1 10*3/uL (ref 0.0–0.7)
Eosinophils Relative: 1.9 % (ref 0.0–5.0)
HCT: 39.8 % (ref 36.0–46.0)
Hemoglobin: 13.4 g/dL (ref 12.0–15.0)
LYMPHS ABS: 2.2 10*3/uL (ref 0.7–4.0)
Lymphocytes Relative: 34 % (ref 12.0–46.0)
MCHC: 33.6 g/dL (ref 30.0–36.0)
MCV: 94.3 fl (ref 78.0–100.0)
Monocytes Absolute: 0.5 10*3/uL (ref 0.1–1.0)
Monocytes Relative: 7.2 % (ref 3.0–12.0)
NEUTROS ABS: 3.7 10*3/uL (ref 1.4–7.7)
NEUTROS PCT: 56.5 % (ref 43.0–77.0)
PLATELETS: 233 10*3/uL (ref 150.0–400.0)
RBC: 4.22 Mil/uL (ref 3.87–5.11)
RDW: 13.4 % (ref 11.5–15.5)
WBC: 6.5 10*3/uL (ref 4.0–10.5)

## 2017-02-02 LAB — LIPID PANEL
CHOL/HDL RATIO: 3
Cholesterol: 244 mg/dL — ABNORMAL HIGH (ref 0–200)
HDL: 82.9 mg/dL (ref >39.00)
LDL CALC: 141 mg/dL — AB (ref 0–99)
NonHDL: 161.36
Triglycerides: 104 mg/dL (ref 0.0–149.0)
VLDL: 20.8 mg/dL (ref 0.0–40.0)

## 2017-02-02 NOTE — Patient Instructions (Addendum)
Will notify you  of labs when available.  Make appt bone density at your mammo   Order rx for dexa .   Continue lifestyle intervention healthy eating and exercise .  No compelling reason for fish oil etc  But foods with omega 3 and  Flaxseed are heart healthy and  Are advised .  Get hearing  Evaluation if problematic   Hearing on going.     Preventive Care 74 Years and Older, Female Preventive care refers to lifestyle choices and visits with your health care provider that can promote health and wellness. What does preventive care include?  A yearly physical exam. This is also called an annual well check.  Dental exams once or twice a year.  Routine eye exams. Ask your health care provider how often you should have your eyes checked.  Personal lifestyle choices, including: ? Daily care of your teeth and gums. ? Regular physical activity. ? Eating a healthy diet. ? Avoiding tobacco and drug use. ? Limiting alcohol use. ? Practicing safe sex. ? Taking low-dose aspirin every day. ? Taking vitamin and mineral supplements as recommended by your health care provider. What happens during an annual well check? The services and screenings done by your health care provider during your annual well check will depend on your age, overall health, lifestyle risk factors, and family history of disease. Counseling Your health care provider may ask you questions about your:  Alcohol use.  Tobacco use.  Drug use.  Emotional well-being.  Home and relationship well-being.  Sexual activity.  Eating habits.  History of falls.  Memory and ability to understand (cognition).  Work and work Statistician.  Reproductive health.  Screening You may have the following tests or measurements:  Height, weight, and BMI.  Blood pressure.  Lipid and cholesterol levels. These may be checked every 5 years, or more frequently if you are over 74 years old.  Skin check.  Lung cancer screening.  You may have this screening every year starting at age 74 if you have a 30-pack-year history of smoking and currently smoke or have quit within the past 15 years.  Fecal occult blood test (FOBT) of the stool. You may have this test every year starting at age 74.  Flexible sigmoidoscopy or colonoscopy. You may have a sigmoidoscopy every 5 years or a colonoscopy every 10 years starting at age 74.  Hepatitis C blood test.  Hepatitis B blood test.  Sexually transmitted disease (STD) testing.  Diabetes screening. This is done by checking your blood sugar (glucose) after you have not eaten for a while (fasting). You may have this done every 1-3 years.  Bone density scan. This is done to screen for osteoporosis. You may have this done starting at age 74.  Mammogram. This may be done every 1-2 years. Talk to your health care provider about how often you should have regular mammograms.  Talk with your health care provider about your test results, treatment options, and if necessary, the need for more tests. Vaccines Your health care provider may recommend certain vaccines, such as:  Influenza vaccine. This is recommended every year.  Tetanus, diphtheria, and acellular pertussis (Tdap, Td) vaccine. You may need a Td booster every 10 years.  Varicella vaccine. You may need this if you have not been vaccinated.  Zoster vaccine. You may need this after age 74.  Measles, mumps, and rubella (MMR) vaccine. You may need at least one dose of MMR if you were born in 1957  or later. You may also need a second dose.  Pneumococcal 13-valent conjugate (PCV13) vaccine. One dose is recommended after age 9.  Pneumococcal polysaccharide (PPSV23) vaccine. One dose is recommended after age 21.  Meningococcal vaccine. You may need this if you have certain conditions.  Hepatitis A vaccine. You may need this if you have certain conditions or if you travel or work in places where you may be exposed to hepatitis  A.  Hepatitis B vaccine. You may need this if you have certain conditions or if you travel or work in places where you may be exposed to hepatitis B.  Haemophilus influenzae type b (Hib) vaccine. You may need this if you have certain conditions.  Talk to your health care provider about which screenings and vaccines you need and how often you need them. This information is not intended to replace advice given to you by your health care provider. Make sure you discuss any questions you have with your health care provider. Document Released: 02/08/2015 Document Revised: 10/02/2015 Document Reviewed: 11/13/2014 Elsevier Interactive Patient Education  [employer].

## 2017-02-03 NOTE — Telephone Encounter (Signed)
Pt is requesting her Losartan be sent to [facility] mail order.  Please advise if any changes to medications prior to me sending Rx.  Results are in [place]. Thanks.

## 2017-02-03 NOTE — Telephone Encounter (Signed)
Ok to send  Same medication to her mail away 90 days  X 1 year

## 2017-02-04 MED ORDER — LOSARTAN POTASSIUM 50 MG PO TABS: 50.0000 mg | ORAL_TABLET | Freq: Every day | ORAL | 3 refills | Status: DC

## 2017-02-04 NOTE — Telephone Encounter (Signed)
Med refilled. Pt aware. Nothing further needed.

## 2017-02-05 ENCOUNTER — Encounter: Payer: Self-pay | Admitting: Internal Medicine

## 2017-02-08 NOTE — Telephone Encounter (Signed)
Dr Regis Bill please advise if you want patient to have Td or Tdap.  Last Td 2007 Pt does not recall ever having Tdap.   Please advise, thanks.

## 2017-02-08 NOTE — Telephone Encounter (Signed)
Tdap x 1 (but not sure if will be covered .)    Td should be covered .   Could ask  Hoyle Sauer or dustin about  coverage

## 2017-02-09 NOTE — Telephone Encounter (Signed)
Pt has [employer] Medicare per our records -- Medicare will not cover a Tetnus vaccine in the office unless there is an injury associated.  They will cover Tdap at the pharmacy filed under her Rx drug benefits.  Check with insurance if wanting to double check coverage prior to getting the vaccine.   Once we are 100% sure we can determine which vaccine to give and where.  Hoyle Sauer is researching more.

## 2017-02-10 NOTE — Telephone Encounter (Signed)
Copied from [place] [id number]. Topic: General - Other >> Feb 10, 2017 12:21 PM Boyd Kerbs wrote: Reason for CRM:   patient wants Torrie (dr. Velora Mediate nurse ) to call her back (was sending emails, but needs to speak to her)  on her getting tetnus shot. Patient has spoken to the ins. Company about.

## 2017-03-16 DIAGNOSIS — L814 Other melanin hyperpigmentation: Secondary | ICD-10-CM | POA: Diagnosis not present

## 2017-03-16 DIAGNOSIS — D485 Neoplasm of uncertain behavior of skin: Secondary | ICD-10-CM | POA: Diagnosis not present

## 2017-03-16 DIAGNOSIS — D225 Melanocytic nevi of trunk: Secondary | ICD-10-CM | POA: Diagnosis not present

## 2017-03-16 DIAGNOSIS — Z85820 Personal history of malignant melanoma of skin: Secondary | ICD-10-CM | POA: Diagnosis not present

## 2017-03-16 DIAGNOSIS — H61009 Unspecified perichondritis of external ear, unspecified ear: Secondary | ICD-10-CM | POA: Diagnosis not present

## 2017-03-16 DIAGNOSIS — L821 Other seborrheic keratosis: Secondary | ICD-10-CM | POA: Diagnosis not present

## 2017-03-16 HISTORY — PX: SKIN BIOPSY: SHX1

## 2017-04-06 ENCOUNTER — Encounter: Payer: Self-pay | Admitting: Adult Health

## 2017-04-06 ENCOUNTER — Ambulatory Visit (INDEPENDENT_AMBULATORY_CARE_PROVIDER_SITE_OTHER): Payer: Medicare HMO | Admitting: Adult Health

## 2017-04-06 VITALS — BP 122/60 | Temp 98.1°F | Wt 144.0 lb

## 2017-04-06 DIAGNOSIS — J0141 Acute recurrent pansinusitis: Secondary | ICD-10-CM

## 2017-04-06 MED ORDER — DOXYCYCLINE HYCLATE 100 MG PO CAPS: 100.0000 mg | ORAL_CAPSULE | Freq: Two times a day (BID) | ORAL | 0 refills | Status: DC

## 2017-04-06 NOTE — Progress Notes (Signed)
Subjective:    Patient ID: Sheila Livingston, female    DOB: 03/24/1945, 72 y.o.   MRN: [id number]  URI   This is a new problem. Episode onset: 10 days  The problem has been unchanged. There has been no fever. Associated symptoms include congestion, coughing, ear pain, headaches, sinus pain, a sore throat and swollen glands. Pertinent negatives include no nausea or vomiting. Treatments tried: NasalCort and Delsym     Review of Systems  Constitutional: Positive for fatigue. Negative for chills and fever.  HENT: Positive for congestion, ear pain, sinus pressure, sinus pain and sore throat. Negative for ear discharge and trouble swallowing.   Respiratory: Positive for cough.   Cardiovascular: Negative.   Gastrointestinal: Negative for nausea and vomiting.  Neurological: Positive for headaches.   Past Medical History:  Diagnosis Date  . Allergy   . Anal fissure   . Arthritis    hands  . Benign paroxysmal positional vertigo   . Complication of anesthesia   . Endometriosis   . Hemorrhoids   . History of skin cancer   . Hyperlipidemia   . Hyperplastic colon polyp   . Hypertension   . IBS (irritable bowel syndrome)   . Melanoma ([place]) 06/10/2016   right thumb   . PONV (postoperative nausea and vomiting)     Social History   Socioeconomic History  . Marital status: Married    Spouse name: Not on file  . Number of children: Not on file  . Years of education: Not on file  . Highest education level: Not on file  Social Needs  . Financial resource strain: Not on file  . Food insecurity - worry: Not on file  . Food insecurity - inability: Not on file  . Transportation needs - medical: Not on file  . Transportation needs - non-medical: Not on file  Occupational History  . Not on file  Tobacco Use  . Smoking status: Former Research scientist (life sciences)  . Smokeless tobacco: Never Used  Substance and Sexual Activity  . Alcohol use: Yes    Alcohol/week: 0.0 oz    Comment: rare  . Drug use: No  . Sexual  activity: Not on file  Other Topics Concern  . Not on file  Social History Narrative   Widowed   Remarried 2015 2016   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    Past Surgical History:  Procedure Laterality Date  . ABDOMINAL HYSTERECTOMY     bso enometriosis; no cancer  . AMPUTATION Right 06/02/2016   Procedure: AMPUTATION RIGHT THUMB INTERPHALANGEAL JOINT;  Surgeon: Daryll Brod, MD;  Location: [facility];  Service: Orthopedics;  Laterality: Right;  . BUNIONECTOMY Left 11/23/2008  . NAILBED REPAIR Right 05/12/2016   Procedure: EXCISION OF NAIL MATRIX BED RIGHT;  Surgeon: Daryll Brod, MD;  Location: [facility];  Service: Orthopedics;  Laterality: Right;  . ROTATOR CUFF REPAIR Right 10/2009  . SKIN FULL THICKNESS GRAFT Right 05/12/2016   Procedure: UPPER ARM SKIN GRAFT RIGHT THUMB;  Surgeon: Daryll Brod, MD;  Location: [facility];  Service: Orthopedics;  Laterality: Right;  . thumb nail surgery Right     Family History  Problem Relation Age of  Onset  . Diabetes Mother   . Stroke Mother   . Breast cancer Sister   . Colon polyps Sister   . Diabetes Sister   . Colon polyps Sister   . Colon cancer Neg Hx     Allergies  Allergen Reactions  . Imodium [Loperamide] Other (See Comments)  . Flonase [Fluticasone Propionate]   . Fluticasone Propionate     REACTION: Facial swelling under eyes nose bleeds  . Lisinopril     REACTION: dizziness and light headed  . Loperamide Hcl     REACTION: passed out  . Other Other (See Comments)    Hummus? Swelling of face  . Benadryl [Diphenhydramine] Anxiety    Current Outpatient Medications on File Prior to Visit  Medication Sig Dispense Refill  . aspirin 81 MG chewable tablet Chew 81 mg by mouth daily.      . Cholecalciferol (VITAMIN  D3) 400 UNITS CAPS Take 1 capsule by mouth daily.    Marland Kitchen docusate sodium (COLACE) 100 MG capsule Take 100 mg by mouth 2 (two) times daily.    . Flaxseed, Linseed, (FLAX SEED OIL PO) Take by mouth.      . losartan (COZAAR) 50 MG tablet Take 1 tablet (50 mg total) by mouth daily. 90 tablet 3  . LYSINE PO Take by mouth.    . MULTIPLE VITAMIN PO Take by mouth.      . Omega-3 Fatty Acids (FISH OIL PO) Take by mouth.    . triamcinolone cream (KENALOG) 0.1 % Apply 1 application topically 2 (two) times daily.     No current facility-administered medications on file prior to visit.     BP 122/60 (BP Location: Left Arm)   Temp 98.1 F (36.7 C) (Oral)   Wt 144 lb (65.3 kg)   BMI 24.34 kg/m       Objective:   Physical Exam  Constitutional: She is oriented to person, place, and time. She appears well-developed and well-nourished. No distress.  HENT:  Head: Normocephalic and atraumatic.  Right Ear: Hearing, tympanic membrane, external ear and ear canal normal.  Left Ear: Hearing, tympanic membrane, external ear and ear canal normal.  Nose: Mucosal edema and rhinorrhea present. Right sinus exhibits maxillary sinus tenderness and frontal sinus tenderness. Left sinus exhibits maxillary sinus tenderness and frontal sinus tenderness.  Mouth/Throat: Uvula is midline, oropharynx is clear and moist and mucous membranes are normal. No oropharyngeal exudate.  Eyes: Conjunctivae and EOM are normal. Pupils are equal, round, and reactive to light. Right eye exhibits no discharge. Left eye exhibits no discharge. No scleral icterus.  Neck: Normal range of motion. Neck supple. No thyromegaly present.  Cardiovascular: Normal rate, regular rhythm, normal heart sounds and intact distal pulses. Exam reveals no gallop and no friction rub.  No murmur heard. Pulmonary/Chest: Effort normal and breath sounds normal. No respiratory distress. She has no wheezes. She has no rales. She exhibits no tenderness.    Lymphadenopathy:    She has no cervical adenopathy.  Neurological: She is alert and oriented to person, place, and time.  Skin: Skin is warm and dry. No rash noted. She is not diaphoretic. No erythema. No pallor.  Psychiatric: She has a normal mood and affect. Her behavior is normal. Judgment and thought content normal.  Nursing note and vitals reviewed.     Assessment & Plan:  1. Acute recurrent pansinusitis - Can continue to use Nasocort - She does not want anything for cough.  - doxycycline (VIBRAMYCIN) 100 MG  capsule; Take 1 capsule (100 mg total) by mouth 2 (two) times daily.  Dispense: 14 capsule; Refill: 0 - Stay hydrated and rest  - Follow up in 2-3 days if no improvement or sooner if symptoms become worse   Dorothyann Peng, NP

## 2017-04-06 NOTE — Progress Notes (Addendum)
Subjective:    Patient ID: Sheila Livingston, female    DOB: 11-09-1945, 72 y.o.   MRN: [id number]  10 day history of fatigue, sore throat, nasal congestion, sinus pain and pressure, diarrhea, headache, bilateral ear pain. She is also having productive cough that is clear to yellow in color. She has a good appetite and fluid intake. She denies fever and chills. She has been taking OTC Delsym and Nasocort. Overall she feels like she has not made any improvement and is not getting better as she expected.    Sore Throat   Associated symptoms include congestion, coughing, diarrhea and ear pain. Pertinent negatives include no shortness of breath or vomiting.  Cough  Associated symptoms include ear pain, a sore throat and wheezing. Pertinent negatives include no chills, fever, postnasal drip, rhinorrhea or shortness of breath.      Review of Systems  Constitutional: Positive for fatigue. Negative for appetite change, chills and fever.  HENT: Positive for congestion, ear pain, sinus pressure, sinus pain and sore throat. Negative for postnasal drip, rhinorrhea and sneezing.   Respiratory: Positive for cough, chest tightness and wheezing. Negative for shortness of breath.   Gastrointestinal: Positive for diarrhea. Negative for nausea and vomiting.   Past Medical History:  Diagnosis Date  . Allergy   . Anal fissure   . Arthritis    hands  . Benign paroxysmal positional vertigo   . Complication of anesthesia   . Endometriosis   . Hemorrhoids   . History of skin cancer   . Hyperlipidemia   . Hyperplastic colon polyp   . Hypertension   . IBS (irritable bowel syndrome)   . Melanoma ([place]) 06/10/2016   right thumb   . PONV (postoperative nausea and vomiting)     Social History   Socioeconomic History  . Marital status: Married    Spouse name: Not on file  . Number of children: Not on file  . Years of education: Not on file  . Highest education level: Not on file  Social Needs  .  Financial resource strain: Not on file  . Food insecurity - worry: Not on file  . Food insecurity - inability: Not on file  . Transportation needs - medical: Not on file  . Transportation needs - non-medical: Not on file  Occupational History  . Not on file  Tobacco Use  . Smoking status: Former Research scientist (life sciences)  . Smokeless tobacco: Never Used  Substance and Sexual Activity  . Alcohol use: Yes    Alcohol/week: 0.0 oz    Comment: rare  . Drug use: No  . Sexual activity: Not on file  Other Topics Concern  . 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    Past Surgical History:  Procedure Laterality Date  . ABDOMINAL HYSTERECTOMY     bso enometriosis; no cancer  . AMPUTATION Right 06/02/2016   Procedure: AMPUTATION RIGHT THUMB INTERPHALANGEAL JOINT;  Surgeon: Daryll Brod, MD;  Location: [facility];  Service: Orthopedics;  Laterality: Right;  . BUNIONECTOMY Left 11/23/2008  . NAILBED REPAIR Right 05/12/2016   Procedure: EXCISION OF NAIL MATRIX BED  RIGHT;  Surgeon: Daryll Brod, MD;  Location: [facility];  Service: Orthopedics;  Laterality: Right;  . ROTATOR CUFF REPAIR Right 10/2009  . SKIN FULL THICKNESS GRAFT Right 05/12/2016   Procedure: UPPER ARM SKIN GRAFT RIGHT THUMB;  Surgeon: Daryll Brod, MD;  Location: [facility];  Service: Orthopedics;  Laterality: Right;  . thumb nail surgery Right     Family History  Problem Relation Age of Onset  . Diabetes Mother   . Stroke Mother   . Breast cancer Sister   . Colon polyps Sister   . Diabetes Sister   . Colon polyps Sister   . Colon cancer Neg Hx     Allergies  Allergen Reactions  . Imodium [Loperamide] Other (See Comments)  . Flonase [Fluticasone Propionate]     . Fluticasone Propionate     REACTION: Facial swelling under eyes nose bleeds  . Lisinopril     REACTION: dizziness and light headed  . Loperamide Hcl     REACTION: passed out  . Other Other (See Comments)    Hummus? Swelling of face  . Benadryl [Diphenhydramine] Anxiety    Current Outpatient Medications on File Prior to Visit  Medication Sig Dispense Refill  . aspirin 81 MG chewable tablet Chew 81 mg by mouth daily.      . Cholecalciferol (VITAMIN D3) 400 UNITS CAPS Take 1 capsule by mouth daily.    Marland Kitchen docusate sodium (COLACE) 100 MG capsule Take 100 mg by mouth 2 (two) times daily.    . Flaxseed, Linseed, (FLAX SEED OIL PO) Take by mouth.      . losartan (COZAAR) 50 MG tablet Take 1 tablet (50 mg total) by mouth daily. 90 tablet 3  . LYSINE PO Take by mouth.    . MULTIPLE VITAMIN PO Take by mouth.      . Omega-3 Fatty Acids (FISH OIL PO) Take by mouth.    . triamcinolone cream (KENALOG) 0.1 % Apply 1 application topically 2 (two) times daily.     No current facility-administered medications on file prior to visit.     BP 122/60 (BP Location: Left Arm)   Temp 98.1 F (36.7 C) (Oral)   Wt 144 lb (65.3 kg)   BMI 24.34 kg/m      Objective:   Physical Exam  Constitutional: She appears well-developed and well-nourished. No distress.  HENT:  Right Ear: Tympanic membrane normal.  Left Ear: Tympanic membrane normal.  Nose: Mucosal edema and rhinorrhea present.  Mouth/Throat: Uvula is midline. Posterior oropharyngeal erythema present. No oropharyngeal exudate or posterior oropharyngeal edema.  Bilateral inferior turbinates are erythematous with exudate.   Eyes: Right eye exhibits no discharge. Left eye exhibits no discharge.  Cardiovascular: Normal rate, regular rhythm, normal heart sounds and intact distal pulses.  Pulmonary/Chest: Effort normal and breath sounds normal. No respiratory distress. She has no wheezes. She has no rales.  Lymphadenopathy:       Head (left side):  Occipital adenopathy present.    She has cervical adenopathy.  No tenderness in lymph nodes.   Skin: Skin is warm and dry. No rash noted. She is not diaphoretic. No erythema. No pallor.  Nursing note and vitals reviewed.      Assessment & Plan:  1. Acute recurrent pansinusitis Continue Delsym for cough during the day.  May use Nyquil.  Tylenol or Motrin fever or discomfort. Continue Nasocort nasal spray. Encourage fluid intake.  - doxycycline (VIBRAMYCIN) 100 MG capsule; Take 1 capsule (100  mg total) by mouth 2 (two) times daily.  Dispense: 14 capsule; Refill: 0   Given return to office information.  Return as needed.

## 2017-04-12 DIAGNOSIS — M8589 Other specified disorders of bone density and structure, multiple sites: Secondary | ICD-10-CM | POA: Diagnosis not present

## 2017-04-12 DIAGNOSIS — Z1231 Encounter for screening mammogram for malignant neoplasm of breast: Secondary | ICD-10-CM | POA: Diagnosis not present

## 2017-04-12 DIAGNOSIS — Z803 Family history of malignant neoplasm of breast: Secondary | ICD-10-CM | POA: Diagnosis not present

## 2017-04-12 LAB — HM DEXA SCAN

## 2017-04-13 ENCOUNTER — Encounter: Payer: Self-pay | Admitting: Internal Medicine

## 2017-04-23 ENCOUNTER — Encounter: Payer: Self-pay | Admitting: Internal Medicine

## 2017-04-29 ENCOUNTER — Encounter: Payer: Self-pay | Admitting: Internal Medicine

## 2017-06-14 DIAGNOSIS — Z85820 Personal history of malignant melanoma of skin: Secondary | ICD-10-CM | POA: Diagnosis not present

## 2017-06-14 DIAGNOSIS — L821 Other seborrheic keratosis: Secondary | ICD-10-CM | POA: Diagnosis not present

## 2017-06-14 DIAGNOSIS — L814 Other melanin hyperpigmentation: Secondary | ICD-10-CM | POA: Diagnosis not present

## 2017-06-14 DIAGNOSIS — D225 Melanocytic nevi of trunk: Secondary | ICD-10-CM | POA: Diagnosis not present

## 2017-06-25 DIAGNOSIS — M6740 Ganglion, unspecified site: Secondary | ICD-10-CM | POA: Diagnosis not present

## 2017-06-25 DIAGNOSIS — M79672 Pain in left foot: Secondary | ICD-10-CM | POA: Diagnosis not present

## 2017-06-25 DIAGNOSIS — R2242 Localized swelling, mass and lump, left lower limb: Secondary | ICD-10-CM | POA: Diagnosis not present

## 2017-07-05 DIAGNOSIS — M6740 Ganglion, unspecified site: Secondary | ICD-10-CM | POA: Diagnosis not present

## 2017-07-15 DIAGNOSIS — Z6825 Body mass index (BMI) 25.0-25.9, adult: Secondary | ICD-10-CM | POA: Diagnosis not present

## 2017-07-15 DIAGNOSIS — Z01419 Encounter for gynecological examination (general) (routine) without abnormal findings: Secondary | ICD-10-CM | POA: Diagnosis not present

## 2017-07-16 DIAGNOSIS — R2242 Localized swelling, mass and lump, left lower limb: Secondary | ICD-10-CM | POA: Diagnosis not present

## 2017-07-26 DIAGNOSIS — N390 Urinary tract infection, site not specified: Secondary | ICD-10-CM | POA: Diagnosis not present

## 2017-07-26 DIAGNOSIS — R82998 Other abnormal findings in urine: Secondary | ICD-10-CM | POA: Diagnosis not present

## 2017-07-26 DIAGNOSIS — N760 Acute vaginitis: Secondary | ICD-10-CM | POA: Diagnosis not present

## 2017-08-17 DIAGNOSIS — H40013 Open angle with borderline findings, low risk, bilateral: Secondary | ICD-10-CM | POA: Diagnosis not present

## 2017-08-17 DIAGNOSIS — H524 Presbyopia: Secondary | ICD-10-CM | POA: Diagnosis not present

## 2017-09-15 DIAGNOSIS — L821 Other seborrheic keratosis: Secondary | ICD-10-CM | POA: Diagnosis not present

## 2017-09-15 DIAGNOSIS — Z85820 Personal history of malignant melanoma of skin: Secondary | ICD-10-CM | POA: Diagnosis not present

## 2017-09-15 DIAGNOSIS — L814 Other melanin hyperpigmentation: Secondary | ICD-10-CM | POA: Diagnosis not present

## 2017-09-15 DIAGNOSIS — Z85828 Personal history of other malignant neoplasm of skin: Secondary | ICD-10-CM | POA: Diagnosis not present

## 2017-09-15 DIAGNOSIS — D1801 Hemangioma of skin and subcutaneous tissue: Secondary | ICD-10-CM | POA: Diagnosis not present

## 2017-09-15 DIAGNOSIS — D225 Melanocytic nevi of trunk: Secondary | ICD-10-CM | POA: Diagnosis not present

## 2017-09-17 DIAGNOSIS — H40013 Open angle with borderline findings, low risk, bilateral: Secondary | ICD-10-CM | POA: Diagnosis not present

## 2017-09-30 DIAGNOSIS — Z803 Family history of malignant neoplasm of breast: Secondary | ICD-10-CM | POA: Diagnosis not present

## 2017-09-30 DIAGNOSIS — Z809 Family history of malignant neoplasm, unspecified: Secondary | ICD-10-CM | POA: Diagnosis not present

## 2017-09-30 DIAGNOSIS — Z833 Family history of diabetes mellitus: Secondary | ICD-10-CM | POA: Diagnosis not present

## 2017-09-30 DIAGNOSIS — K5900 Constipation, unspecified: Secondary | ICD-10-CM | POA: Diagnosis not present

## 2017-09-30 DIAGNOSIS — Z85828 Personal history of other malignant neoplasm of skin: Secondary | ICD-10-CM | POA: Diagnosis not present

## 2017-09-30 DIAGNOSIS — Z8249 Family history of ischemic heart disease and other diseases of the circulatory system: Secondary | ICD-10-CM | POA: Diagnosis not present

## 2017-09-30 DIAGNOSIS — Z823 Family history of stroke: Secondary | ICD-10-CM | POA: Diagnosis not present

## 2017-09-30 DIAGNOSIS — R69 Illness, unspecified: Secondary | ICD-10-CM | POA: Diagnosis not present

## 2017-09-30 DIAGNOSIS — I10 Essential (primary) hypertension: Secondary | ICD-10-CM | POA: Diagnosis not present

## 2017-09-30 DIAGNOSIS — Z7982 Long term (current) use of aspirin: Secondary | ICD-10-CM | POA: Diagnosis not present

## 2017-10-05 ENCOUNTER — Ambulatory Visit (INDEPENDENT_AMBULATORY_CARE_PROVIDER_SITE_OTHER): Payer: Medicare HMO | Admitting: Internal Medicine

## 2017-10-05 ENCOUNTER — Encounter: Payer: Self-pay | Admitting: Internal Medicine

## 2017-10-05 VITALS — BP 110/62 | HR 66 | Temp 98.0°F | Wt 147.1 lb

## 2017-10-05 DIAGNOSIS — Z8709 Personal history of other diseases of the respiratory system: Secondary | ICD-10-CM | POA: Diagnosis not present

## 2017-10-05 DIAGNOSIS — H6120 Impacted cerumen, unspecified ear: Secondary | ICD-10-CM | POA: Diagnosis not present

## 2017-10-05 DIAGNOSIS — R220 Localized swelling, mass and lump, head: Secondary | ICD-10-CM

## 2017-10-05 DIAGNOSIS — R0981 Nasal congestion: Secondary | ICD-10-CM | POA: Diagnosis not present

## 2017-10-05 NOTE — Progress Notes (Signed)
Chief Complaint  Patient presents with  . Sinus Problem    Pt notes some swelling at right bridge of nose. Pt states she feels "stuffy" in her sinuses, worse on right side. Pt wants to ensure that there are no polpys or anything abnormal going on.    HPI: Sheila Livingston 72 y.o. come in for sda  See above   no fever.  Feels swollen right side of  Nasal   bridge.  And  husband noted    Feels like pressure    Has hx of ruptured sinus years ago .  Nasal stuffiness more on right than left   NO fever cold sx  Hx of sinuitis rx  In past year   ROS: See pertinent positives and negatives per HPI. No trauma  Nose bleeds    Past Medical History:  Diagnosis Date  . Allergy   . Anal fissure   . Arthritis    hands  . Benign paroxysmal positional vertigo   . Complication of anesthesia   . Endometriosis   . Hemorrhoids   . History of skin cancer   . Hyperlipidemia   . Hyperplastic colon polyp   . Hypertension   . IBS (irritable bowel syndrome)   . Melanoma ([place]) 06/10/2016   right thumb   . PONV (postoperative nausea and vomiting)     Family History  Problem Relation Age of Onset  . Diabetes Mother   . Stroke Mother   . Breast cancer Sister   . Colon polyps Sister   . Diabetes Sister   . Colon polyps Sister   . Colon cancer Neg Hx     Social History   Socioeconomic History  . Marital status: Married    Spouse name: Not on file  . Number of children: Not on file  . Years of education: Not on file  . Highest education level: Not on file  Occupational History  . Not on file  Social Needs  . Financial resource strain: Not on file  . Food insecurity:    Worry: Not on file    Inability: Not on file  . Transportation needs:    Medical: Not on file    Non-medical: Not on file  Tobacco Use  . Smoking status: Former Research scientist (life sciences)  . Smokeless tobacco: Never Used  Substance and Sexual Activity  . Alcohol use: Yes    Alcohol/week: 0.0 standard drinks    Comment: rare  . Drug use:  No  . Sexual activity: Not on file  Lifestyle  . Physical activity:    Days per week: Not on file    Minutes per session: Not on file  . Stress: Not on file  Relationships  . Social connections:    Talks on phone: Not on file    Gets together: Not on file    Attends religious service: Not on file    Active member of club or organization: Not on file    Attends meetings of clubs or organizations: Not on file    Relationship status: Not on file  Other Topics Concern  . 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    Outpatient Medications Prior to Visit  Medication Sig Dispense Refill  . aspirin 81 MG chewable tablet Chew 81 mg by mouth daily.      . Cholecalciferol (VITAMIN D3) 400 UNITS CAPS Take 1 capsule by mouth daily.    Marland Kitchen docusate sodium (COLACE) 100 MG capsule Take 100 mg by mouth 2 (two) times daily.    . Flaxseed, Linseed, (FLAX SEED OIL PO) Take by mouth.      . losartan (COZAAR) 50 MG tablet Take 1 tablet (50 mg total) by mouth daily. 90 tablet 3  . LYSINE PO Take by mouth.    . MULTIPLE VITAMIN PO Take by mouth.      . Omega-3 Fatty Acids (FISH OIL PO) Take by mouth.    . triamcinolone cream (KENALOG) 0.1 % Apply 1 application topically 2 (two) times daily.    . TURMERIC PO Take 750 mg by mouth daily.    Marland Kitchen doxycycline (VIBRAMYCIN) 100 MG capsule Take 1 capsule (100 mg total) by mouth 2 (two) times daily. (Patient not taking: Reported on 10/05/2017) 14 capsule 0   No facility-administered medications prior to visit.      EXAM:  BP 110/62 (BP Location: Right Arm, Patient Position: Sitting, Cuff Size: Normal)   Pulse 66   Temp 98 F (36.7 C) (Oral)   Wt 147 lb 1.6 oz (66.7 kg)   SpO2 97%   BMI 24.86 kg/m   Body mass index is  24.86 kg/m.  GENERAL: vitals reviewed and listed above, alert, oriented, appears well hydrated and in no acute distress HEENT: atraumatic, conjunctiva  Clear,   No rednes  But proninent  Right nasal bridge and into cheek no redness or tendernessOP : no lesion edema or exudate  Nares  1+ congested canniot see any masses   Left tm nl right teac  1+ wax irrigated fro removal NECK: no obvious masses on inspection palpation  LUNGS: clear to auscultation bilaterally, no wheezes, rales or rhonchi, good air movement CV: HRRR, no clubbing cyanosis or  peripheral edema nl cap refill  MS: moves all extremities without noticeable focal  abnormality PSYCH: pleasant and cooperative, no obvious depression or anxiety  BP Readings from Last 3 Encounters:  10/05/17 110/62  04/06/17 122/60  02/02/17 130/72    ASSESSMENT AND PLAN:  Discussed the following assessment and plan:  Nasal swelling - Plan: Ambulatory referral to ENT  Nasal congestion - Plan: Ambulatory referral to ENT  Wax in ear  History of sinusitis  Has asymmetry  Seems bony but pat says is new as well as her husband  And also stuffiness on that time  Has hx of sinus issues   Advise try nasacort and referral to ent for further more complete exam    Wax in right ear [place]    -Patient advised to return or notify health care team  if  new concerns arise.  Patient Instructions  Will plan  ent evaluation  In interim   Add nasacort .  In the interim .     Standley Brooking. Panosh [name]

## 2017-10-05 NOTE — Patient Instructions (Addendum)
Will plan  ent evaluation  In interim   Add nasacort .  In the interim .

## 2017-10-07 DIAGNOSIS — J329 Chronic sinusitis, unspecified: Secondary | ICD-10-CM | POA: Diagnosis not present

## 2017-10-25 DIAGNOSIS — M19012 Primary osteoarthritis, left shoulder: Secondary | ICD-10-CM | POA: Diagnosis not present

## 2017-10-25 DIAGNOSIS — M25512 Pain in left shoulder: Secondary | ICD-10-CM | POA: Diagnosis not present

## 2017-10-25 DIAGNOSIS — M7542 Impingement syndrome of left shoulder: Secondary | ICD-10-CM | POA: Diagnosis not present

## 2017-11-04 ENCOUNTER — Ambulatory Visit: Payer: Medicare HMO | Attending: Specialist | Admitting: Physical Therapy

## 2017-11-04 ENCOUNTER — Encounter: Payer: Self-pay | Admitting: Physical Therapy

## 2017-11-04 DIAGNOSIS — M25612 Stiffness of left shoulder, not elsewhere classified: Secondary | ICD-10-CM | POA: Diagnosis present

## 2017-11-04 DIAGNOSIS — M25512 Pain in left shoulder: Secondary | ICD-10-CM | POA: Insufficient documentation

## 2017-11-04 DIAGNOSIS — R252 Cramp and spasm: Secondary | ICD-10-CM | POA: Diagnosis present

## 2017-11-04 NOTE — Therapy (Signed)
[place] [address] [place], [place], [address] Phone: [phone]   Fax:  [phone]  Physical Therapy Evaluation  Patient Details  Name: Sheila Livingston MRN: [id number] Date of Birth: 1945/11/09 Referring Provider (PT): RA Theda Sers   Encounter Date: 11/04/2017  PT End of Session - 11/04/17 0919    Visit Number  1    Date for PT Re-Evaluation  01/04/18    PT Start Time  1761    PT Stop Time  0924    PT Time Calculation (min)  40 min    Activity Tolerance  Patient tolerated treatment well    Behavior During Therapy  [facility] for tasks assessed/performed       Past Medical History:  Diagnosis Date  . Allergy   . Anal fissure   . Arthritis    hands  . Benign paroxysmal positional vertigo   . Complication of anesthesia   . Endometriosis   . Hemorrhoids   . History of skin cancer   . Hyperlipidemia   . Hyperplastic colon polyp   . Hypertension   . IBS (irritable bowel syndrome)   . Melanoma ([place]) 06/10/2016   right thumb   . PONV (postoperative nausea and vomiting)     Past Surgical History:  Procedure Laterality Date  . ABDOMINAL HYSTERECTOMY     bso enometriosis; no cancer  . AMPUTATION Right 06/02/2016   Procedure: AMPUTATION RIGHT THUMB INTERPHALANGEAL JOINT;  Surgeon: Daryll Brod, MD;  Location: [place];  Service: Orthopedics;  Laterality: Right;  . BUNIONECTOMY Left 11/23/2008  . NAILBED REPAIR Right 05/12/2016   Procedure: EXCISION OF NAIL MATRIX BED RIGHT;  Surgeon: Daryll Brod, MD;  Location: [place];  Service: Orthopedics;  Laterality: Right;  . ROTATOR CUFF REPAIR Right 10/2009  . SKIN BIOPSY  03/16/2017   shave biopsy, right inferior helix, chondrodermatits nodularis helicis  . SKIN FULL THICKNESS GRAFT Right 05/12/2016   Procedure: UPPER ARM SKIN GRAFT RIGHT THUMB;  Surgeon: Daryll Brod, MD;  Location: [place] [place];  Service: Orthopedics;  Laterality:  Right;  . thumb nail surgery Right     There were no vitals filed for this visit.   Subjective Assessment - 11/04/17 0841    Subjective  Patient with left shoulder pain for "a long time" off and on, reports that recently it has been worse with bothering her in her sleep.  Also difficulkty with driving car.  MD order states impingement, OA and possible RC tear..  Reports that she had a cortisone injection last week and that has helped some.      Limitations  House hold activities;Lifting    Patient Stated Goals  have less pain, less difficulty with ADL's    Currently in Pain?  Yes    Pain Score  2     Pain Location  Shoulder    Pain Orientation  Left;Posterior;Lateral    Pain Descriptors / Indicators  Aching;Sore    Pain Type  Acute pain    Pain Radiating Towards  denies    Pain Onset  More than a month ago    Pain Frequency  Constant    Aggravating Factors   reaching out, reaching up, worst is abduction with weight pain an 8-9/10    Pain Relieving Factors  some advil, not using the arm pain down to a 2-3/10    Effect of Pain on Daily Activities  difficulty with dressing and hair  [facility] PT Assessment - 11/04/17 0001      Assessment   Medical Diagnosis  left shoulder pain, impingement    Referring Provider (PT)  RA Collins    Onset Date/Surgical Date  10/05/17    Hand Dominance  Right    Prior Therapy  no      Precautions   Precautions  None      Balance Screen   Has the patient fallen in the past 6 months  No    Has the patient had a decrease in activity level because of a fear of falling?   No    Is the patient reluctant to leave their home because of a fear of falling?   No      Home Environment   Additional Comments  does her own housework, Haematologist, gardening      Prior Function   Level of Independence  Independent    Vocation  Retired    Leisure  goes to gym takes an active adult class 2-3 x/week, bowls and cradles ball with the left hand       Posture/Postural Control   Posture Comments  fwd head, rounded shoulders      ROM / Strength   AROM / PROM / Strength  AROM;PROM;Strength      AROM   AROM Assessment Site  Shoulder    Right/Left Shoulder  Left    Left Shoulder Flexion  170 Degrees    Left Shoulder ABduction  170 Degrees    Left Shoulder Internal Rotation  45 Degrees    Left Shoulder External Rotation  65 Degrees      PROM   Overall PROM Comments  WNL's but with pain at end ranges    PROM Assessment Site  Shoulder    Right/Left Shoulder  Left      Strength   Overall Strength Comments  Strength is 4-/5 with significant pain especially inthe posterior latera shoulder      Palpation   Palpation comment  she is very tender with spasms in the infraspinatus      Special Tests   Other special tests  + impingement and very painful empty can                Objective measurements completed on examination: See above findings.              PT Education - 11/04/17 0919    Education Details  red tband scap stab and ER    Person(s) Educated  Patient    Methods  Explanation;Demonstration;Handout;Verbal cues;Tactile cues    Comprehension  Verbalized understanding;Returned demonstration       PT Short Term Goals - 11/04/17 0922      PT SHORT TERM GOAL #1   Title  independent with initial HEP    Time  2    Period  Weeks    Status  New        PT Long Term Goals - 11/04/17 6962      PT LONG TERM GOAL #1   Title  return safely to gym    Time  8    Period  Weeks    Status  New      PT LONG TERM GOAL #2   Title  decrease pain 50%    Time  8    Period  Weeks    Status  New      PT LONG TERM GOAL #3   Title  bowl without  pain    Time  8    Period  Weeks    Status  New      PT LONG TERM GOAL #4   Title  pain will not disturb sleep    Time  8    Period  Weeks    Status  New      PT LONG TERM GOAL #5   Title  increase ER strength to 4+/5    Time  8    Period  Weeks    Status  New              Plan - 11/04/17 0920    Clinical Impression Statement  Patient with left shoulder pain, MD feels impingement, OA and possible RC tear.  She has + impingment and empty cn tests, has some rounded shoulder posture, has significant spasms and tenderness in the left infraspinatus.  Mild limitation in AROM.     Clinical Presentation  Stable    Clinical Decision Making  Low    Rehab Potential  Good    PT Frequency  2x / week    PT Duration  8 weeks    PT Treatment/Interventions  ADLs/Self Care Home Management;Cryotherapy;Electrical Stimulation;Iontophoresis 4mg /ml Dexamethasone;Moist Heat;Ultrasound;Therapeutic exercise;Therapeutic activities;Patient/family education;Manual techniques;Dry needling    PT Next Visit Plan  start scapular stabilization, gym exercises and address the infraspinatus pain    Consulted and Agree with Plan of Care  Patient       Patient will benefit from skilled therapeutic intervention in order to improve the following deficits and impairments:  Decreased range of motion, Increased muscle spasms, Impaired UE functional use, Pain, Improper body mechanics, Impaired flexibility, Decreased strength, Postural dysfunction  Visit Diagnosis: Acute pain of left shoulder - Plan: PT plan of care cert/re-cert  Cramp and spasm - Plan: PT plan of care cert/re-cert  Stiffness of left shoulder, not elsewhere classified - Plan: PT plan of care cert/re-cert     Problem List Patient Active Problem List   Diagnosis Date Noted  . Melanoma in situ ([place]) 05/31/2016  . Metatarsal deformity 08/29/2014  . Metatarsalgia of right foot 08/29/2014  . Welcome to Medicare preventive visit 10/27/2013  . Hyperlipidemia 10/27/2013  . Dermatitis 01/25/2013  . Visit for preventive health examination 01/25/2013  . Anogenital lichen sclerosus 09/73/5329  . Hyperkalemia 01/23/2011  . ENDOMETRIOSIS 03/24/2010  . HYPERLIPIDEMIA 01/13/2010  . LEG PAIN, RIGHT 01/13/2010  .  BUNIONS, LEFT FOOT 09/19/2008  . NECK PAIN 03/21/2008  . SYMPTOMATIC MENOPAUSAL/FEMALE CLIMACTERIC STATES 12/28/2007  . RECTAL BLEEDING 09/19/2007  . ALLERGIC RHINITIS, SEASONAL 05/19/2007  . BENIGN PAROXYSMAL POSITIONAL VERTIGO 01/05/2007  . HYPERTRIGLYCERIDEMIA 12/22/2006  . COLONIC POLYPS, HX OF 12/22/2006  . Essential hypertension 10/29/2006    Sumner Boast., PT 11/04/2017, 9:24 AM  [address] [place], [place], [address] Phone: [phone]   Fax:  [phone]  Name: Sheila Livingston MRN: [id number] Date of Birth: 13-Sep-1945

## 2017-11-09 ENCOUNTER — Ambulatory Visit: Payer: Medicare HMO | Admitting: Physical Therapy

## 2017-11-09 DIAGNOSIS — M25512 Pain in left shoulder: Secondary | ICD-10-CM

## 2017-11-09 DIAGNOSIS — M25612 Stiffness of left shoulder, not elsewhere classified: Secondary | ICD-10-CM | POA: Diagnosis not present

## 2017-11-09 DIAGNOSIS — R252 Cramp and spasm: Secondary | ICD-10-CM

## 2017-11-09 NOTE — Therapy (Signed)
[place], [place], [address] Phone: [phone]   Fax:  [phone]  Physical Therapy Treatment  Patient Details  Name: Sheila Livingston MRN: [id number] Date of Birth: 1945/07/15 Referring Provider (PT): RA Theda Sers   Encounter Date: 11/09/2017  PT End of Session - 11/09/17 1401    Visit Number  2    Date for PT Re-Evaluation  01/04/18    PT Start Time  1230    PT Stop Time  1315    PT Time Calculation (min)  45 min       Past Medical History:  Diagnosis Date  . Allergy   . Anal fissure   . Arthritis    hands  . Benign paroxysmal positional vertigo   . Complication of anesthesia   . Endometriosis   . Hemorrhoids   . History of skin cancer   . Hyperlipidemia   . Hyperplastic colon polyp   . Hypertension   . IBS (irritable bowel syndrome)   . Melanoma ([place]) 06/10/2016   right thumb   . PONV (postoperative nausea and vomiting)     Past Surgical History:  Procedure Laterality Date  . ABDOMINAL HYSTERECTOMY     bso enometriosis; no cancer  . AMPUTATION Right 06/02/2016   Procedure: AMPUTATION RIGHT THUMB INTERPHALANGEAL JOINT;  Surgeon: Daryll Brod, MD;  Location: [place];  Service: Orthopedics;  Laterality: Right;  . BUNIONECTOMY Left 11/23/2008  . NAILBED REPAIR Right 05/12/2016   Procedure: EXCISION OF NAIL MATRIX BED RIGHT;  Surgeon: Daryll Brod, MD;  Location: [facility];  Service: Orthopedics;  Laterality: Right;  . ROTATOR CUFF REPAIR Right 10/2009  . SKIN BIOPSY  03/16/2017   shave biopsy, right inferior helix, chondrodermatits nodularis helicis  . SKIN FULL THICKNESS GRAFT Right 05/12/2016   Procedure: UPPER ARM SKIN GRAFT RIGHT THUMB;  Surgeon: Daryll Brod, MD;  Location: [facility];  Service: Orthopedics;  Laterality: Right;  . thumb nail surgery Right     There were no vitals filed for this visit.  Subjective Assessment -  11/09/17 1233    Subjective  doing HEP and doing well. no pain at rest ,increases with mvmt    Currently in Pain?  Yes    Pain Score  4     Pain Location  Shoulder    Pain Orientation  Left                       [facility] Adult PT Treatment/Exercise - 11/09/17 0001      Exercises   Exercises  Shoulder      Shoulder Exercises: Seated   Other Seated Exercises  seated row,ext and horz abd 3# 15 times each      Shoulder Exercises: Standing   Other Standing Exercises  empty can an dPNF 2 sets 10   3# both caused discomfort     Shoulder Exercises: ROM/Strengthening   UBE (Upper Arm Bike)  L 3 3 fwd/3back    Lat Pull  20 reps   20#   Cybex Row  20 reps   20#     Modalities   Modalities  Iontophoresis      Iontophoresis   Type of Iontophoresis  Dexamethasone    Location  Left post shld    Dose  1.2 cc dex    Time  58mA 4 hour patch      Manual Therapy  Manual Therapy  Passive ROM;Soft tissue mobilization;Neural Stretch;Taping    Soft tissue mobilization  RT UT- large trigger point    Passive ROM  full PROM    Neural Stretch  negative    Kinesiotex  Create Space   trap- limited tolerance to STW              PT Short Term Goals - 11/04/17 0922      PT SHORT TERM GOAL #1   Title  independent with initial HEP    Time  2    Period  Weeks    Status  New        PT Long Term Goals - 11/04/17 3875      PT LONG TERM GOAL #1   Title  return safely to gym    Time  8    Period  Weeks    Status  New      PT LONG TERM GOAL #2   Title  decrease pain 50%    Time  8    Period  Weeks    Status  New      PT LONG TERM GOAL #3   Title  bowl without pain    Time  8    Period  Weeks    Status  New      PT LONG TERM GOAL #4   Title  pain will not disturb sleep    Time  8    Period  Weeks    Status  New      PT LONG TERM GOAL #5   Title  increase ER strength to 4+/5    Time  8    Period  Weeks    Status  New            Plan - 11/09/17  1401    Clinical Impression Statement  weakness and soreness with impindment ex, large TP in LEft UT. trial of tape and iont    PT Treatment/Interventions  ADLs/Self Care Home Management;Cryotherapy;Electrical Stimulation;Iontophoresis 4mg /ml Dexamethasone;Moist Heat;Ultrasound;Therapeutic exercise;Therapeutic activities;Patient/family education;Manual techniques;Dry needling    PT Next Visit Plan  assess and progress       Patient will benefit from skilled therapeutic intervention in order to improve the following deficits and impairments:  Decreased range of motion, Increased muscle spasms, Impaired UE functional use, Pain, Improper body mechanics, Impaired flexibility, Decreased strength, Postural dysfunction  Visit Diagnosis: Acute pain of left shoulder  Cramp and spasm  Stiffness of left shoulder, not elsewhere classified     Problem List Patient Active Problem List   Diagnosis Date Noted  . Melanoma in situ ([place]) 05/31/2016  . Metatarsal deformity 08/29/2014  . Metatarsalgia of right foot 08/29/2014  . Welcome to Medicare preventive visit 10/27/2013  . Hyperlipidemia 10/27/2013  . Dermatitis 01/25/2013  . Visit for preventive health examination 01/25/2013  . Anogenital lichen sclerosus 64/33/2951  . Hyperkalemia 01/23/2011  . ENDOMETRIOSIS 03/24/2010  . HYPERLIPIDEMIA 01/13/2010  . LEG PAIN, RIGHT 01/13/2010  . BUNIONS, LEFT FOOT 09/19/2008  . NECK PAIN 03/21/2008  . SYMPTOMATIC MENOPAUSAL/FEMALE CLIMACTERIC STATES 12/28/2007  . RECTAL BLEEDING 09/19/2007  . ALLERGIC RHINITIS, SEASONAL 05/19/2007  . BENIGN PAROXYSMAL POSITIONAL VERTIGO 01/05/2007  . HYPERTRIGLYCERIDEMIA 12/22/2006  . COLONIC POLYPS, HX OF 12/22/2006  . Essential hypertension 10/29/2006    Starlett Pehrson,ANGIE PTA 11/09/2017, 2:03 PM  [place] [address] [place], [place], [address] Phone: [phone]   Fax:  [phone]  Name: Sheila Livingston MRN: [id number] Date of Birth: Jun 04, 1945

## 2017-11-11 DIAGNOSIS — R69 Illness, unspecified: Secondary | ICD-10-CM | POA: Diagnosis not present

## 2017-11-16 ENCOUNTER — Ambulatory Visit: Payer: Medicare HMO | Admitting: Physical Therapy

## 2017-11-16 DIAGNOSIS — M25612 Stiffness of left shoulder, not elsewhere classified: Secondary | ICD-10-CM | POA: Diagnosis not present

## 2017-11-16 DIAGNOSIS — R252 Cramp and spasm: Secondary | ICD-10-CM | POA: Diagnosis not present

## 2017-11-16 DIAGNOSIS — M25512 Pain in left shoulder: Secondary | ICD-10-CM | POA: Diagnosis not present

## 2017-11-16 NOTE — Therapy (Signed)
[place] [address] [place], [place], [address] Phone: [phone]   Fax:  [phone]  Physical Therapy Treatment  Patient Details  Name: Sheila Livingston MRN: [id number] Date of Birth: Jun 24, 1945 Referring Provider (PT): RA Theda Sers   Encounter Date: 11/16/2017  PT End of Session - 11/16/17 0827    Visit Number  3    Date for PT Re-Evaluation  01/04/18    PT Start Time  0272    PT Stop Time  0835    PT Time Calculation (min)  40 min       Past Medical History:  Diagnosis Date  . Allergy   . Anal fissure   . Arthritis    hands  . Benign paroxysmal positional vertigo   . Complication of anesthesia   . Endometriosis   . Hemorrhoids   . History of skin cancer   . Hyperlipidemia   . Hyperplastic colon polyp   . Hypertension   . IBS (irritable bowel syndrome)   . Melanoma ([place]) 06/10/2016   right thumb   . PONV (postoperative nausea and vomiting)     Past Surgical History:  Procedure Laterality Date  . ABDOMINAL HYSTERECTOMY     bso enometriosis; no cancer  . AMPUTATION Right 06/02/2016   Procedure: AMPUTATION RIGHT THUMB INTERPHALANGEAL JOINT;  Surgeon: Daryll Brod, MD;  Location: [facility];  Service: Orthopedics;  Laterality: Right;  . BUNIONECTOMY Left 11/23/2008  . NAILBED REPAIR Right 05/12/2016   Procedure: EXCISION OF NAIL MATRIX BED RIGHT;  Surgeon: Daryll Brod, MD;  Location: [facility];  Service: Orthopedics;  Laterality: Right;  . ROTATOR CUFF REPAIR Right 10/2009  . SKIN BIOPSY  03/16/2017   shave biopsy, right inferior helix, chondrodermatits nodularis helicis  . SKIN FULL THICKNESS GRAFT Right 05/12/2016   Procedure: UPPER ARM SKIN GRAFT RIGHT THUMB;  Surgeon: Daryll Brod, MD;  Location: [facility];  Service: Orthopedics;  Laterality: Right;  . thumb nail surgery Right     There were no vitals filed for this visit.  Subjective Assessment -  11/16/17 0755    Subjective  bowling and did well. pain in back but tape helped. I feel fatigue in shlds.    Currently in Pain?  Yes    Pain Score  2     Pain Location  Shoulder    Pain Orientation  Left;Posterior         [facility] PT Assessment - 11/16/17 0001      AROM   AROM Assessment Site  Shoulder    Right/Left Shoulder  Left    Left Shoulder Internal Rotation  70 Degrees    Left Shoulder External Rotation  80 Degrees                   [facility] Adult PT Treatment/Exercise - 11/16/17 0001      Exercises   Exercises  Shoulder      Shoulder Exercises: Seated   Other Seated Exercises  seated row,ext and horz abd 4# 15 times each      Shoulder Exercises: Standing   Other Standing Exercises  empty can and PNF 2 sets 10    Other Standing Exercises  standing scap pulleys and rotation      Shoulder Exercises: ROM/Strengthening   UBE (Upper Arm Bike)  L 3 3 fwd/3back      Modalities   Modalities  Iontophoresis      Iontophoresis  Type of Iontophoresis  Dexamethasone    Location  Left post shld    Dose  1.2 cc dex    Time  62m 4 hour patch      Manual Therapy   Manual Therapy  Passive ROM;Soft tissue mobilization;Taping    Soft tissue mobilization  Left UT- large trigger point    [place]            PT Education - 11/16/17 0827    Education Details  empty can and PNF at home with free wt    Person(s) Educated  Patient    Methods  Explanation;Demonstration    Comprehension  Verbalized understanding;Returned demonstration       PT Short Term Goals - 11/16/17 0827      PT SHORT TERM GOAL #1   Title  independent with initial HEP    Status  Achieved        PT Long Term Goals - 11/16/17 0827      PT LONG TERM GOAL #1   Title  return safely to gym    Status  Partially Met      PT LONG TERM GOAL #2   Title  decrease pain 50%    Status  Partially Met      PT LONG TERM GOAL #3   Title  bowl without pain    Status  Partially  Met      PT LONG TERM GOAL #4   Title  pain will not disturb sleep    Status  Achieved      PT LONG TERM GOAL #5   Title  increase ER strength to 4+/5    Status  Partially Met            Plan - 11/16/17 0828    Clinical Impression Statement  weakness and fatigue in shld , ROM and pain much better. progressing with goals. responding well to ionto and tape    PT Treatment/Interventions  ADLs/Self Care Home Management;Cryotherapy;Electrical Stimulation;Iontophoresis 471mml Dexamethasone;Moist Heat;Ultrasound;Therapeutic exercise;Therapeutic activities;Patient/family education;Manual techniques;Dry needling    PT Next Visit Plan  Assess ROM and MMT. Write MD note.        Patient will benefit from skilled therapeutic intervention in order to improve the following deficits and impairments:  Decreased range of motion, Increased muscle spasms, Impaired UE functional use, Pain, Improper body mechanics, Impaired flexibility, Decreased strength, Postural dysfunction  Visit Diagnosis: Cramp and spasm  Stiffness of left shoulder, not elsewhere classified     Problem List Patient Active Problem List   Diagnosis Date Noted  . [place]05/06/2016  . Metatarsal deformity 08/29/2014  . Metatarsalgia of right foot 08/29/2014  . Welcome to Medicare preventive visit 10/27/2013  . Hyperlipidemia 10/27/2013  . Dermatitis 01/25/2013  . Visit for preventive health examination 01/25/2013  . Anogenital lichen sclerosus 1232/44/0102. Hyperkalemia 01/23/2011  . ENDOMETRIOSIS 03/24/2010  . HYPERLIPIDEMIA 01/13/2010  . LEG PAIN, RIGHT 01/13/2010  . BUNIONS, LEFT FOOT 09/19/2008  . NECK PAIN 03/21/2008  . SYMPTOMATIC MENOPAUSAL/FEMALE CLIMACTERIC STATES 12/28/2007  . RECTAL BLEEDING 09/19/2007  . ALLERGIC RHINITIS, SEASONAL 05/19/2007  . BENIGN PAROXYSMAL POSITIONAL VERTIGO 01/05/2007  . HYPERTRIGLYCERIDEMIA 12/22/2006  . COLONIC POLYPS, HX OF 12/22/2006  . Essential  hypertension 10/29/2006    PAYSEUR,ANGIE  PTA 11/16/2017, 8:29 AM  [id number]: [phone] Fax:  [phone]:  Sheila Livingston MRN: [id number] Date of Birth: 05/21/1945

## 2017-11-24 ENCOUNTER — Ambulatory Visit: Payer: Medicare HMO | Admitting: Physical Therapy

## 2017-11-24 DIAGNOSIS — R252 Cramp and spasm: Secondary | ICD-10-CM | POA: Diagnosis not present

## 2017-11-24 DIAGNOSIS — M25612 Stiffness of left shoulder, not elsewhere classified: Secondary | ICD-10-CM | POA: Diagnosis not present

## 2017-11-24 DIAGNOSIS — M7542 Impingement syndrome of left shoulder: Secondary | ICD-10-CM | POA: Diagnosis not present

## 2017-11-24 DIAGNOSIS — M19012 Primary osteoarthritis, left shoulder: Secondary | ICD-10-CM | POA: Diagnosis not present

## 2017-11-24 DIAGNOSIS — M25512 Pain in left shoulder: Secondary | ICD-10-CM

## 2017-11-24 NOTE — Therapy (Signed)
[address] [place], [place], [address] Phone: [phone]   Fax:  [phone]  Physical Therapy Treatment  Patient Details  Name: Sheila Livingston MRN: [id number] Date of Birth: Nov 23, 1945 Referring Provider (PT): RA Theda Sers   Encounter Date: 11/24/2017  PT End of Session - 11/24/17 0934    Visit Number  4    Date for PT Re-Evaluation  01/04/18    PT Start Time  0840    PT Stop Time  0932    PT Time Calculation (min)  52 min    Activity Tolerance  Patient tolerated treatment well    Behavior During Therapy  [facility] for tasks assessed/performed       Past Medical History:  Diagnosis Date  . Allergy   . Anal fissure   . Arthritis    hands  . Benign paroxysmal positional vertigo   . Complication of anesthesia   . Endometriosis   . Hemorrhoids   . History of skin cancer   . Hyperlipidemia   . Hyperplastic colon polyp   . Hypertension   . IBS (irritable bowel syndrome)   . Melanoma ([place]) 06/10/2016   right thumb   . PONV (postoperative nausea and vomiting)     Past Surgical History:  Procedure Laterality Date  . ABDOMINAL HYSTERECTOMY     bso enometriosis; no cancer  . AMPUTATION Right 06/02/2016   Procedure: AMPUTATION RIGHT THUMB INTERPHALANGEAL JOINT;  Surgeon: Daryll Brod, MD;  Location: [facility];  Service: Orthopedics;  Laterality: Right;  . BUNIONECTOMY Left 11/23/2008  . NAILBED REPAIR Right 05/12/2016   Procedure: EXCISION OF NAIL MATRIX BED RIGHT;  Surgeon: Daryll Brod, MD;  Location: [facility];  Service: Orthopedics;  Laterality: Right;  . ROTATOR CUFF REPAIR Right 10/2009  . SKIN BIOPSY  03/16/2017   shave biopsy, right inferior helix, chondrodermatits nodularis helicis  . SKIN FULL THICKNESS GRAFT Right 05/12/2016   Procedure: UPPER ARM SKIN GRAFT RIGHT THUMB;  Surgeon: Daryll Brod, MD;  Location: [facility];  Service: Orthopedics;  Laterality:  Right;  . thumb nail surgery Right     There were no vitals filed for this visit.  Subjective Assessment - 11/24/17 0840    Subjective  I'm feeling great"    Currently in Pain?  No/denies         [facility] PT Assessment - 11/24/17 0001      ROM / Strength   AROM / PROM / Strength  AROM;Strength      AROM   AROM Assessment Site  Shoulder    Right/Left Shoulder  Left    Left Shoulder Flexion  170 Degrees    Left Shoulder ABduction  175 Degrees    Left Shoulder Internal Rotation  90 Degrees    Left Shoulder External Rotation  100 Degrees      Strength   Strength Assessment Site  Shoulder    Right/Left Shoulder  Left    Left Shoulder Flexion  4+/5    Left Shoulder ABduction  4/5    Left Shoulder Internal Rotation  4/5    Left Shoulder External Rotation  4+/5                   [facility] Adult PT Treatment/Exercise - 11/24/17 0001      Exercises   Exercises  Shoulder      Shoulder Exercises: Standing   Theraband Level (Shoulder Horizontal ABduction)  Level 3 (Green)   2x10   Shoulder Flexion Weight (lbs)  2x10, 2 lbs    Shoulder ABduction Weight (lbs)  2x10, 2 lbs    Theraband Level (Shoulder Extension)  Level 3 (Green)   2x10   Theraband Level (Shoulder Row)  Level 3 (Green)   2x10   Other Standing Exercises  empty can, 1 lb, 2x10    Other Standing Exercises  PNF D2 flexion, 2x10, 4 lbs      Shoulder Exercises: ROM/Strengthening   UBE (Upper Arm Bike)  L 3 3 fwd/3back    Lat Pull  20 reps   25 lbs     Shoulder Exercises: Stretch   Tiu Stretch - Flexion  5 reps;20 seconds      Modalities   Modalities  Iontophoresis      Iontophoresis   Type of Iontophoresis  Dexamethasone    Location  Left post shld    Dose  1.2 cc dex    Time  108m 4 hour patch      Manual Therapy   Manual Therapy  Passive ROM;Soft tissue mobilization    Soft tissue mobilization  Left UT- large trigger point             PT Education - 11/24/17 0933    Education Details   Tennis ball rolling on [place] to help massage L upper trap    Person(s) Educated  Patient    Methods  Explanation    Comprehension  Verbalized understanding       PT Short Term Goals - 11/16/17 0827      PT SHORT TERM GOAL #1   Title  independent with initial HEP    Status  Achieved        PT Long Term Goals - 11/16/17 0827      PT LONG TERM GOAL #1   Title  return safely to gym    Status  Partially Met      PT LONG TERM GOAL #2   Title  decrease pain 50%    Status  Partially Met      PT LONG TERM GOAL #3   Title  bowl without pain    Status  Partially Met      PT LONG TERM GOAL #4   Title  pain will not disturb sleep    Status  Achieved      PT LONG TERM GOAL #5   Title  increase ER strength to 4+/5    Status  Partially Met            Plan - 11/24/17 0934    Clinical Impression Statement  Pt demonstrated some fatigue with shoulder exercises as session progressed. ROM continues to improve with less pain per patient verbalization. Pt presents with some increased pain in posterior scapular area when arm is brought into horzontal adduction.    Rehab Potential  Good    PT Frequency  2x / week    PT Duration  8 weeks    PT Treatment/Interventions  ADLs/Self Care Home Management;Cryotherapy;Electrical Stimulation;Iontophoresis '4mg'$ /ml Dexamethasone;Moist Heat;Ultrasound;Therapeutic exercise;Therapeutic activities;Patient/family education;Manual techniques;Dry needling    PT Next Visit Plan  Continue progressing pt with exercises.        Patient will benefit from skilled therapeutic intervention in order to improve the following deficits and impairments:  Decreased range of motion, Increased muscle spasms, Impaired UE functional use, Pain, Improper body mechanics, Impaired flexibility, Decreased strength, Postural dysfunction  Visit Diagnosis: Cramp and spasm  Stiffness of left shoulder, not elsewhere classified  Acute pain of left shoulder     Problem  List Patient Active Problem List   Diagnosis Date Noted  . Melanoma in situ ([place]) 05/31/2016  . Metatarsal deformity 08/29/2014  . Metatarsalgia of right foot 08/29/2014  . Welcome to Medicare preventive visit 10/27/2013  . Hyperlipidemia 10/27/2013  . Dermatitis 01/25/2013  . Visit for preventive health examination 01/25/2013  . Anogenital lichen sclerosus 64/38/3779  . Hyperkalemia 01/23/2011  . ENDOMETRIOSIS 03/24/2010  . HYPERLIPIDEMIA 01/13/2010  . LEG PAIN, RIGHT 01/13/2010  . BUNIONS, LEFT FOOT 09/19/2008  . NECK PAIN 03/21/2008  . SYMPTOMATIC MENOPAUSAL/FEMALE CLIMACTERIC STATES 12/28/2007  . RECTAL BLEEDING 09/19/2007  . ALLERGIC RHINITIS, SEASONAL 05/19/2007  . BENIGN PAROXYSMAL POSITIONAL VERTIGO 01/05/2007  . HYPERTRIGLYCERIDEMIA 12/22/2006  . COLONIC POLYPS, HX OF 12/22/2006  . Essential hypertension 10/29/2006    Howell Rucks, SPTA 11/24/2017, 9:39 AM  [place] [address] [place], [place], [address] Phone: [phone]   Fax:  [phone]  Name: Sheila Livingston MRN: [id number] Date of Birth: 06/15/1945

## 2017-12-21 DIAGNOSIS — Z85828 Personal history of other malignant neoplasm of skin: Secondary | ICD-10-CM | POA: Diagnosis not present

## 2017-12-21 DIAGNOSIS — L821 Other seborrheic keratosis: Secondary | ICD-10-CM | POA: Diagnosis not present

## 2017-12-21 DIAGNOSIS — L400 Psoriasis vulgaris: Secondary | ICD-10-CM | POA: Diagnosis not present

## 2017-12-21 DIAGNOSIS — L570 Actinic keratosis: Secondary | ICD-10-CM | POA: Diagnosis not present

## 2017-12-21 DIAGNOSIS — Z85820 Personal history of malignant melanoma of skin: Secondary | ICD-10-CM | POA: Diagnosis not present

## 2017-12-21 DIAGNOSIS — D1801 Hemangioma of skin and subcutaneous tissue: Secondary | ICD-10-CM | POA: Diagnosis not present

## 2018-02-02 NOTE — Progress Notes (Signed)
Chief Complaint  Patient presents with  . Annual Exam    Pt is concern about bowel movements today   . Medication Management  . Hypertension    HPI: Sheila Livingston 73 y.o. comes in today for Preventive Medicare exam/ wellness visit  And  Med management .Since last visit. Doing well.   BP no problem with medication bp in 120 range  bms :sometimes hard to evacuated using fiber   ocass blood with painful bm but no other changes  Is utd on colonoscopy.  Follows derm for her melanoma  r thumb  No new lesions  Has lichen sclerosis and uses tmc prn  Still hot flushes at times.   Health Maintenance  Topic Date Due  . Hepatitis C Screening  1945-06-24  . TETANUS/TDAP  01/27/2015  . MAMMOGRAM  04/10/2018  . COLONOSCOPY  08/11/2022  . INFLUENZA VACCINE  Completed  . DEXA SCAN  Completed  . PNA vac Low Risk Adult  Completed   Health Maintenance Review LIFESTYLE:  Exercise:  gyme 2-3 x per week  Bow[place].  Again   Tobacco/ETS: no Alcohol: social l Sugar beverages:no Sleep:7  Drug use: no HH: 2  No pets    Hearing: ok  Slight dec with testing  onlu problematic in church at times   Vision:  No limitations at present . Last eye check UTD  Safety:  Has smoke detector and wears seat belts.  No firearms. No excess sun exposure. Sees dentist regularly.  Falls:  no  Memory: Felt to be good  , no concern from her or her family.  Depression: No anhedonia unusual crying or depressive symptoms  Nutrition: Eats well balanced diet; adequate calcium and vitamin D. No swallowing chewing problems.  Injury: no major injuries in the last six months.  Other healthcare providers:  Reviewed today .  Preventive parameters: up-to-date  Reviewed   ADLS:   There are no problems or need for assistance  driving, feeding, obtaining food, dressing, toileting and bathing, managing money using phone. She is independent.   ROS:  Stress incontinence when sneezes other wise nl.  GEN/ HEENT: No  fever, significant weight changes sweats headaches vision problems hearing changes, CV/ PULM; No chest pain shortness of breath cough, syncope,edema  change in exercise tolerance. GI /GU: No adominal pain, vomiting, change in bowel habits. No blood in the stool. No significant GU symptoms. SKIN/HEME: ,no acute skin rashes suspicious lesions or bleeding. No lymphadenopathy, nodules, masses.  NEURO/ PSYCH:  No neurologic signs such as weakness numbness. No depression anxiety. IMM/ Allergy: No unusual infections.  Allergy .   REST of 12 system review negative except as per HPI   Past Medical History:  Diagnosis Date  . Allergy   . Anal fissure   . Arthritis    hands  . Benign paroxysmal positional vertigo   . Complication of anesthesia   . Endometriosis   . Hemorrhoids   . History of skin cancer   . Hyperlipidemia   . Hyperplastic colon polyp   . Hypertension   . IBS (irritable bowel syndrome)   . Melanoma ([place]) 06/10/2016   right thumb   . Melanoma in situ ([place]) 05/31/2016  . PONV (postoperative nausea and vomiting)     Family History  Problem Relation Age of Onset  . Diabetes Mother   . Stroke Mother   . Breast cancer Sister   . Colon polyps Sister   . Diabetes Sister   . Colon polyps  Sister   . Colon cancer Neg Hx     Social History   Socioeconomic History  . Marital status: Married    Spouse name: Not on file  . Number of children: Not on file  . Years of education: Not on file  . Highest education level: Not on file  Occupational History  . Not on file  Social Needs  . Financial resource strain: Not on file  . Food insecurity:    Worry: Not on file    Inability: Not on file  . Transportation needs:    Medical: Not on file    Non-medical: Not on file  Tobacco Use  . Smoking status: Former Research scientist (life sciences)  . Smokeless tobacco: Never Used  Substance and Sexual Activity  . Alcohol use: Yes    Alcohol/week: 0.0 standard drinks    Comment: rare  . Drug use: No  .  Sexual activity: Not on file  Lifestyle  . Physical activity:    Days per week: Not on file    Minutes per session: Not on file  . Stress: Not on file  Relationships  . Social connections:    Talks on phone: Not on file    Gets together: Not on file    Attends religious service: Not on file    Active member of club or organization: Not on file    Attends meetings of clubs or organizations: Not on file    Relationship status: Not on file  Other Topics Concern  . 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    Outpatient Encounter Medications as of 02/03/2018  Medication Sig  . aspirin 81 MG chewable tablet Chew 81 mg by mouth daily.    . Cholecalciferol (VITAMIN D3) 400 UNITS CAPS Take 1 capsule by mouth daily.  Marland Kitchen docusate sodium (COLACE) 100 MG capsule Take 100 mg by mouth 2 (two) times daily.  . Flaxseed, Linseed, (FLAX SEED OIL PO) Take by mouth.    . losartan (COZAAR) 50 MG tablet Take 1 tablet (50 mg total) by mouth daily.  Marland Kitchen LYSINE PO Take by mouth.  . MULTIPLE VITAMIN PO Take by mouth.    . Omega-3 Fatty Acids (FISH OIL PO) Take by mouth.  . triamcinolone cream (KENALOG) 0.1 % Apply 1 application topically 2 (two) times daily.  . TURMERIC PO Take 750 mg by mouth daily.   No facility-administered encounter medications on file as of 02/03/2018.     EXAM:  BP 124/68 (BP Location: Right Arm, Patient Position: Sitting, Cuff Size: Normal)   Pulse 76   Temp 98.3 F (36.8 C) (Oral)   Ht '5\' 4"'$  (1.626 m)   Wt 145 lb 11.2 oz (66.1 kg)   BMI 25.01 kg/m   Body mass index is 25.01 kg/m.  Physical Exam: Vital signs reviewed JSE:GBTD is a well-developed well-nourished alert cooperative   who appears stated age in no acute distress.  HEENT:  normocephalic atraumatic , Eyes: PERRL EOM's full, conjunctiva clear, Nares: paten,t no deformity discharge or tenderness., Ears: no deformity EAC's clear TMs with normal landmarks. Mouth: clear OP, no lesions, edema.  Moist  mucous membranes. Dentition in adequate repair. NECK: supple without masses, thyromegaly or bruits. CHEST/PULM:  Clear to auscultation and percussion breath sounds equal no wheeze , rales or rhonchi. No chest Wulf deformities or tenderness.Breast: normal by inspection . No dimpling, discharge, masses, tenderness or discharge . CV: PMI is nondisplaced, S1 S2 no gallops, murmurs, rubs. Peripheral pulses are full without delay.No JVD .  ABDOMEN: Bowel sounds normal nontender  No guard or rebound, no hepato splenomegal no CVA tenderness.   Extremtities:  No clubbing cyanosis or edema, no acute joint swelling or redness no focal atrophy  Distal r thumb amputation  NEURO:  Oriented x3, cranial nerves 3-12 appear to be intact, no obvious focal weakness,gait within normal limits no abnormal reflexes or asymmetrical SKIN: No acute rashes normal turgor, color, no bruising or petechiae. PSYCH: Oriented, good eye contact, no obvious depression anxiety, cognition and judgment appear normal. LN: no cervical axillary inguinal adenopathy No noted deficits in memory, attention, and speech.   Lab Results  Component Value Date   WBC 6.5 02/02/2017   HGB 13.4 02/02/2017   HCT 39.8 02/02/2017   PLT 233.0 02/02/2017   GLUCOSE 96 02/02/2017   CHOL 244 (H) 02/02/2017   TRIG 104.0 02/02/2017   HDL 82.90 02/02/2017   LDLDIRECT 144.5 01/11/2013   LDLCALC 141 (H) 02/02/2017   ALT 17 02/02/2017   AST 17 02/02/2017   NA 140 02/02/2017   K 4.4 02/02/2017   CL 103 02/02/2017   CREATININE 0.80 02/02/2017   BUN 12 02/02/2017   CO2 29 02/02/2017   TSH 1.18 12/12/2014    ASSESSMENT AND PLAN:  Discussed the following assessment and plan:  Visit for preventive health examination  Medication  management - Plan: Basic metabolic panel, CBC with Differential/Platelet, Hepatic function panel, Lipid panel, TSH  Essential hypertension - Plan: Basic metabolic panel, CBC with Differential/Platelet, Hepatic function panel, Lipid panel, TSH  Hyperlipidemia, unspecified hyperlipidemia type - Plan: Basic metabolic panel, CBC with Differential/Platelet, Hepatic function panel, Lipid panel, TSH  Need for hepatitis C screening test - Plan: Hepatitis C antibody  History of melanoma in situ   No alarm sx  Try kegels is healthy   Patient Care Team: Burnis Medin, MD as PCP - General (Internal Medicine) Sydnee Cabal, MD (Orthopedic Surgery) amy Emanuelson  Linda Hedges, DO as Consulting Physician (Obstetrics and Gynecology) Daryll Brod, MD as Consulting Physician (Orthopedic Surgery) Johnathan Hausen, MD as Consulting Physician (General Surgery) Ayesha Mohair (Dermatology)  Patient Instructions  Glad you are doing well .   Will notify you  of labs when available.   Yearly cpx   If bowel issues   Become alarming then see gi but  Try prune juice kegels etc.   Kegel Exercises Kegel exercises help strengthen the muscles that support the rectum, vagina, small intestine, bladder, and uterus. Doing Kegel exercises can help:  Improve bladder and bowel control.  Improve sexual response.  Reduce problems and discomfort during pregnancy. Kegel exercises involve squeezing your pelvic floor muscles, which are the same muscles you squeeze when you try to stop the flow of urine. The exercises can be done while sitting, standing, or lying down, but it is best to vary your position. Exercises 1. Squeeze your pelvic floor muscles tight. You should feel a tight lift in your rectal area. If you are a female, you should also feel a tightness in your vaginal area. Keep your stomach, buttocks, and legs relaxed. 2. Hold the muscles tight  for up to 10 seconds. 3. Relax your muscles. Repeat this  exercise 50 times a day or as many times as told by your health care provider. Continue to do this exercise for at least 4-6 weeks or for as long as told by your health care provider. This information is not intended to replace advice given to you by your health care provider. Make sure you discuss any questions you have with your health care provider. Document Released: 12/30/2011 Document Revised: 05/25/2016 Document Reviewed: 12/02/2014 Elsevier Interactive Patient Education  2019 [place] 65 Years and Older, Female Preventive care refers to lifestyle choices and visits with your health care provider that can promote health and wellness. What does preventive care include?  A yearly physical exam. This is also called an annual well check.  Dental exams once or twice a year.  Routine eye exams. Ask your health care provider how often you should have your eyes checked.  Personal lifestyle choices, including: ? Daily care of your teeth and gums. ? Regular physical activity. ? Eating a healthy diet. ? Avoiding tobacco and drug use. ? Limiting alcohol use. ? Practicing safe sex. ? Taking low-dose aspirin every day. ? Taking vitamin and mineral supplements as recommended by your health care provider. What happens during an annual well check? The services and screenings done by your health care provider during your annual well check will depend on your age, overall health, lifestyle risk factors, and family history of disease. Counseling Your health care provider may ask you questions about your:  Alcohol use.  Tobacco use.  Drug use.  Emotional well-being.  Home and relationship well-being.  Sexual activity.  Eating habits.  History of falls.  Memory and ability to understand (cognition).  Work and work Statistician.  Reproductive health.  Screening You may have the following tests or measurements:  Height, weight, and BMI.  Blood  pressure.  Lipid and cholesterol levels. These may be checked every 5 years, or more frequently if you are over 33 years old.  Skin check.  Lung cancer screening. You may have this screening every year starting at age 67 if you have a 30-pack-year history of smoking and currently smoke or have quit within the past 15 years.  Colorectal cancer screening. All adults should have this screening starting at age 23 and continuing until age 81. You will have tests every 1-10 years, depending on your results and the type of screening test. People at increased risk should start screening at an earlier age. Screening tests may include: ? Guaiac-based fecal occult blood testing. ? Fecal immunochemical test (FIT). ? Stool DNA test. ? Virtual colonoscopy. ? Sigmoidoscopy. During this test, a flexible tube with a tiny camera (sigmoidoscope) is used to examine your rectum and lower colon. The sigmoidoscope is inserted through your anus into your rectum and lower colon. ? Colonoscopy. During this test, a long, thin, flexible tube with a tiny camera (colonoscope) is used to examine your entire colon and rectum.  Hepatitis C blood test.  Hepatitis B blood test.  Sexually transmitted disease (STD) testing.  Diabetes screening. This is done by checking your blood sugar (glucose) after you have not eaten for a while (fasting). You may have this done every 1-3 years.  Bone density scan. This is done to screen for osteoporosis. You may have this done starting at age 31.  Mammogram. This may be done every 1-2 years. Talk to your health care provider about how  often you should have regular mammograms. Talk with your health care provider about your test results, treatment options, and if necessary, the need for more tests. Vaccines Your health care provider may recommend certain vaccines, such as:  Influenza vaccine. This is recommended every year.  Tetanus, diphtheria, and acellular pertussis (Tdap, Td)  vaccine. You may need a Td booster every 10 years.  Varicella vaccine. You may need this if you have not been vaccinated.  Zoster vaccine. You may need this after age 12.  Measles, mumps, and rubella (MMR) vaccine. You may need at least one dose of MMR if you were born in 1957 or later. You may also need a second dose.  Pneumococcal 13-valent conjugate (PCV13) vaccine. One dose is recommended after age 64.  Pneumococcal polysaccharide (PPSV23) vaccine. One dose is recommended after age 99.  Meningococcal vaccine. You may need this if you have certain conditions.  Hepatitis A vaccine. You may need this if you have certain conditions or if you travel or work in places where you may be exposed to hepatitis A.  Hepatitis B vaccine. You may need this if you have certain conditions or if you travel or work in places where you may be exposed to hepatitis B.  Haemophilus influenzae type b (Hib) vaccine. You may need this if you have certain conditions. Talk to your health care provider about which screenings and vaccines you need and how often you need them. This information is not intended to replace advice given to you by your health care provider. Make sure you discuss any questions you have with your health care provider. Document Released: 02/08/2015 Document Revised: 03/04/2017 Document Reviewed: 11/13/2014 Elsevier Interactive Patient Education  2019 [place] K.  [name]

## 2018-02-03 ENCOUNTER — Ambulatory Visit (INDEPENDENT_AMBULATORY_CARE_PROVIDER_SITE_OTHER): Payer: PPO | Admitting: Internal Medicine

## 2018-02-03 ENCOUNTER — Encounter: Payer: Self-pay | Admitting: Internal Medicine

## 2018-02-03 VITALS — BP 124/68 | HR 76 | Temp 98.3°F | Ht 64.0 in | Wt 145.7 lb

## 2018-02-03 DIAGNOSIS — E785 Hyperlipidemia, unspecified: Secondary | ICD-10-CM | POA: Diagnosis not present

## 2018-02-03 DIAGNOSIS — Z0000 Encounter for general adult medical examination without abnormal findings: Secondary | ICD-10-CM | POA: Diagnosis not present

## 2018-02-03 DIAGNOSIS — Z1159 Encounter for screening for other viral diseases: Secondary | ICD-10-CM | POA: Diagnosis not present

## 2018-02-03 DIAGNOSIS — I10 Essential (primary) hypertension: Secondary | ICD-10-CM | POA: Diagnosis not present

## 2018-02-03 DIAGNOSIS — Z86006 Personal history of melanoma in-situ: Secondary | ICD-10-CM

## 2018-02-03 DIAGNOSIS — Z79899 Other long term (current) drug therapy: Secondary | ICD-10-CM

## 2018-02-03 LAB — CBC WITH DIFFERENTIAL/PLATELET
BASOS ABS: 0 10*3/uL (ref 0.0–0.1)
Basophils Relative: 0.5 % (ref 0.0–3.0)
Eosinophils Absolute: 0.2 10*3/uL (ref 0.0–0.7)
Eosinophils Relative: 2.6 % (ref 0.0–5.0)
HCT: 38 % (ref 36.0–46.0)
Hemoglobin: 13.1 g/dL (ref 12.0–15.0)
LYMPHS ABS: 2.2 10*3/uL (ref 0.7–4.0)
Lymphocytes Relative: 36.2 % (ref 12.0–46.0)
MCHC: 34.4 g/dL (ref 30.0–36.0)
MCV: 93.5 fl (ref 78.0–100.0)
MONO ABS: 0.5 10*3/uL (ref 0.1–1.0)
MONOS PCT: 7.7 % (ref 3.0–12.0)
NEUTROS ABS: 3.2 10*3/uL (ref 1.4–7.7)
NEUTROS PCT: 53 % (ref 43.0–77.0)
PLATELETS: 235 10*3/uL (ref 150.0–400.0)
RBC: 4.06 Mil/uL (ref 3.87–5.11)
RDW: 13.1 % (ref 11.5–15.5)
WBC: 6 10*3/uL (ref 4.0–10.5)

## 2018-02-03 LAB — BASIC METABOLIC PANEL
BUN: 12 mg/dL (ref 6–23)
CALCIUM: 10.3 mg/dL (ref 8.4–10.5)
CO2: 28 meq/L (ref 19–32)
CREATININE: 0.87 mg/dL (ref 0.40–1.20)
Chloride: 103 mEq/L (ref 96–112)
GFR: 67.52 mL/min (ref >60.00)
GLUCOSE: 88 mg/dL (ref 70–99)
Potassium: 4.2 mEq/L (ref 3.5–5.1)
SODIUM: 139 meq/L (ref 135–145)

## 2018-02-03 LAB — HEPATIC FUNCTION PANEL
ALBUMIN: 4.3 g/dL (ref 3.5–5.2)
ALK PHOS: 65 U/L (ref 39–117)
ALT: 17 U/L (ref 0–35)
AST: 19 U/L (ref 0–37)
BILIRUBIN TOTAL: 0.6 mg/dL (ref 0.2–1.2)
Bilirubin, Direct: 0.1 mg/dL (ref 0.0–0.3)
Total Protein: 6.6 g/dL (ref 6.0–8.3)

## 2018-02-03 LAB — LIPID PANEL
Cholesterol: 270 mg/dL — ABNORMAL HIGH (ref 0–200)
HDL: 76.1 mg/dL (ref >39.00)
LDL Cholesterol: 165 mg/dL — ABNORMAL HIGH (ref 0–99)
NonHDL: 193.7
Total CHOL/HDL Ratio: 4
Triglycerides: 143 mg/dL (ref 0.0–149.0)
VLDL: 28.6 mg/dL (ref 0.0–40.0)

## 2018-02-03 LAB — TSH: TSH: 0.9 u[IU]/mL (ref 0.35–4.50)

## 2018-02-03 NOTE — Patient Instructions (Signed)
Glad you are doing well .   Will notify you  of labs when available.   Yearly cpx   If bowel issues   Become alarming then see gi but  Try prune juice kegels etc.   Kegel Exercises Kegel exercises help strengthen the muscles that support the rectum, vagina, small intestine, bladder, and uterus. Doing Kegel exercises can help:  Improve bladder and bowel control.  Improve sexual response.  Reduce problems and discomfort during pregnancy. Kegel exercises involve squeezing your pelvic floor muscles, which are the same muscles you squeeze when you try to stop the flow of urine. The exercises can be done while sitting, standing, or lying down, but it is best to vary your position. Exercises 1. Squeeze your pelvic floor muscles tight. You should feel a tight lift in your rectal area. If you are a female, you should also feel a tightness in your vaginal area. Keep your stomach, buttocks, and legs relaxed. 2. Hold the muscles tight for up to 10 seconds. 3. Relax your muscles. Repeat this exercise 50 times a day or as many times as told by your health care provider. Continue to do this exercise for at least 4-6 weeks or for as long as told by your health care provider. This information is not intended to replace advice given to you by your health care provider. Make sure you discuss any questions you have with your health care provider. Document Released: 12/30/2011 Document Revised: 05/25/2016 Document Reviewed: 12/02/2014 Elsevier Interactive Patient Education  2019 [place] 65 Years and Older, Female Preventive care refers to lifestyle choices and visits with your health care provider that can promote health and wellness. What does preventive care include?  A yearly physical exam. This is also called an annual well check.  Dental exams once or twice a year.  Routine eye exams. Ask your health care provider how often you should have your eyes checked.  Personal  lifestyle choices, including: ? Daily care of your teeth and gums. ? Regular physical activity. ? Eating a healthy diet. ? Avoiding tobacco and drug use. ? Limiting alcohol use. ? Practicing safe sex. ? Taking low-dose aspirin every day. ? Taking vitamin and mineral supplements as recommended by your health care provider. What happens during an annual well check? The services and screenings done by your health care provider during your annual well check will depend on your age, overall health, lifestyle risk factors, and family history of disease. Counseling Your health care provider may ask you questions about your:  Alcohol use.  Tobacco use.  Drug use.  Emotional well-being.  Home and relationship well-being.  Sexual activity.  Eating habits.  History of falls.  Memory and ability to understand (cognition).  Work and work Statistician.  Reproductive health.  Screening You may have the following tests or measurements:  Height, weight, and BMI.  Blood pressure.  Lipid and cholesterol levels. These may be checked every 5 years, or more frequently if you are over 13 years old.  Skin check.  Lung cancer screening. You may have this screening every year starting at age 59 if you have a 30-pack-year history of smoking and currently smoke or have quit within the past 15 years.  Colorectal cancer screening. All adults should have this screening starting at age 29 and continuing until age 63. You will have tests every 1-10 years, depending on your results and the type of screening test. People at increased risk should start  screening at an earlier age. Screening tests may include: ? Guaiac-based fecal occult blood testing. ? Fecal immunochemical test (FIT). ? Stool DNA test. ? Virtual colonoscopy. ? Sigmoidoscopy. During this test, a flexible tube with a tiny camera (sigmoidoscope) is used to examine your rectum and lower colon. The sigmoidoscope is inserted through your  anus into your rectum and lower colon. ? Colonoscopy. During this test, a long, thin, flexible tube with a tiny camera (colonoscope) is used to examine your entire colon and rectum.  Hepatitis C blood test.  Hepatitis B blood test.  Sexually transmitted disease (STD) testing.  Diabetes screening. This is done by checking your blood sugar (glucose) after you have not eaten for a while (fasting). You may have this done every 1-3 years.  Bone density scan. This is done to screen for osteoporosis. You may have this done starting at age 39.  Mammogram. This may be done every 1-2 years. Talk to your health care provider about how often you should have regular mammograms. Talk with your health care provider about your test results, treatment options, and if necessary, the need for more tests. Vaccines Your health care provider may recommend certain vaccines, such as:  Influenza vaccine. This is recommended every year.  Tetanus, diphtheria, and acellular pertussis (Tdap, Td) vaccine. You may need a Td booster every 10 years.  Varicella vaccine. You may need this if you have not been vaccinated.  Zoster vaccine. You may need this after age 110.  Measles, mumps, and rubella (MMR) vaccine. You may need at least one dose of MMR if you were born in 1957 or later. You may also need a second dose.  Pneumococcal 13-valent conjugate (PCV13) vaccine. One dose is recommended after age 6.  Pneumococcal polysaccharide (PPSV23) vaccine. One dose is recommended after age 33.  Meningococcal vaccine. You may need this if you have certain conditions.  Hepatitis A vaccine. You may need this if you have certain conditions or if you travel or work in places where you may be exposed to hepatitis A.  Hepatitis B vaccine. You may need this if you have certain conditions or if you travel or work in places where you may be exposed to hepatitis B.  Haemophilus influenzae type b (Hib) vaccine. You may need this if  you have certain conditions. Talk to your health care provider about which screenings and vaccines you need and how often you need them. This information is not intended to replace advice given to you by your health care provider. Make sure you discuss any questions you have with your health care provider. Document Released: 02/08/2015 Document Revised: 03/04/2017 Document Reviewed: 11/13/2014 Elsevier Interactive Patient Education  2019 [employer].

## 2018-02-04 ENCOUNTER — Other Ambulatory Visit: Payer: Self-pay | Admitting: Internal Medicine

## 2018-02-04 DIAGNOSIS — E785 Hyperlipidemia, unspecified: Secondary | ICD-10-CM

## 2018-02-04 LAB — HEPATITIS C ANTIBODY
Hepatitis C Ab: NONREACTIVE
SIGNAL TO CUT-OFF: 0.03 (ref <1.00)

## 2018-03-22 DIAGNOSIS — D225 Melanocytic nevi of trunk: Secondary | ICD-10-CM | POA: Diagnosis not present

## 2018-03-22 DIAGNOSIS — D1801 Hemangioma of skin and subcutaneous tissue: Secondary | ICD-10-CM | POA: Diagnosis not present

## 2018-03-22 DIAGNOSIS — B0089 Other herpesviral infection: Secondary | ICD-10-CM | POA: Diagnosis not present

## 2018-03-22 DIAGNOSIS — Z85820 Personal history of malignant melanoma of skin: Secondary | ICD-10-CM | POA: Diagnosis not present

## 2018-03-25 ENCOUNTER — Encounter: Payer: Self-pay | Admitting: Internal Medicine

## 2018-03-25 ENCOUNTER — Ambulatory Visit (INDEPENDENT_AMBULATORY_CARE_PROVIDER_SITE_OTHER): Payer: PPO | Admitting: Internal Medicine

## 2018-03-25 VITALS — BP 132/68 | HR 72 | Temp 98.1°F | Ht 64.0 in | Wt 147.4 lb

## 2018-03-25 DIAGNOSIS — B001 Herpesviral vesicular dermatitis: Secondary | ICD-10-CM

## 2018-03-25 DIAGNOSIS — Z79899 Other long term (current) drug therapy: Secondary | ICD-10-CM

## 2018-03-25 MED ORDER — VALACYCLOVIR HCL 1 G PO TABS: 2000.0000 mg | ORAL_TABLET | Freq: Two times a day (BID) | ORAL | 3 refills | Status: DC

## 2018-03-25 NOTE — Progress Notes (Signed)
Chief Complaint  Patient presents with  . Blister    patient complains of fever blisters on her lip x3 days ago    HPI: Sheila Livingston 73 y.o. come in for acute problem.  She does have recurrent fever blisters but more recently she is getting them more frequently about monthly.  This 1 began a number of days ago was at the skin center and her dermatologist gave her prescription for Valtrex 500 to take 3 times a day but she did not start it because was too late in the process.  She perhaps has more areas involved of irritation. She has a Abreva that she is used no other unusual new topicals. No fever or trauma other change in medicines.  No change in make-up.  Dental work on 12 of February. ROS: See pertinent positives and negatives per HPI.  Past Medical History:  Diagnosis Date  . Allergy   . Anal fissure   . Arthritis    hands  . Benign paroxysmal positional vertigo   . Complication of anesthesia   . Endometriosis   . Hemorrhoids   . History of skin cancer   . Hyperlipidemia   . Hyperplastic colon polyp   . Hypertension   . IBS (irritable bowel syndrome)   . Melanoma ([place]) 06/10/2016   right thumb   . Melanoma in situ ([place]) 05/31/2016  . PONV (postoperative nausea and vomiting)     Family History  Problem Relation Age of Onset  . Diabetes Mother   . Stroke Mother   . Breast cancer Sister   . Colon polyps Sister   . Diabetes Sister   . Colon polyps Sister   . Colon cancer Neg Hx     Social History   Socioeconomic History  . Marital status: Married    Spouse name: Not on file  . Number of children: Not on file  . Years of education: Not on file  . Highest education level: Not on file  Occupational History  . Not on file  Social Needs  . Financial resource strain: Not on file  . Food insecurity:    Worry: Not on file    Inability: Not on file  . Transportation needs:    Medical: Not on file    Non-medical: Not on file  Tobacco Use  . Smoking status:  Former Research scientist (life sciences)  . Smokeless tobacco: Never Used  Substance and Sexual Activity  . Alcohol use: Yes    Alcohol/week: 0.0 standard drinks    Comment: rare  . Drug use: No  . Sexual activity: Not on file  Lifestyle  . Physical activity:    Days per week: Not on file    Minutes per session: Not on file  . Stress: Not on file  Relationships  . Social connections:    Talks on phone: Not on file    Gets together: Not on file    Attends religious service: Not on file    Active member of club or organization: Not on file    Attends meetings of clubs or organizations: Not on file    Relationship status: Not on file  Other Topics Concern  . 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    Outpatient Medications Prior to Visit  Medication Sig Dispense Refill  . aspirin 81 MG chewable tablet Chew 81 mg by mouth daily.      . Cholecalciferol (VITAMIN D3) 400 UNITS CAPS Take 1 capsule by mouth daily.    Marland Kitchen docusate sodium (COLACE) 100 MG capsule Take 100 mg by mouth 2 (two) times daily.    . Flaxseed, Linseed, (FLAX SEED OIL PO) Take by mouth.      . losartan (COZAAR) 50 MG tablet Take 1 tablet (50 mg total) by mouth daily. 90 tablet 3  . LYSINE PO Take by mouth.    . MULTIPLE VITAMIN PO Take by mouth.      . Omega-3 Fatty Acids (FISH OIL PO) Take by mouth.    . triamcinolone cream (KENALOG) 0.1 % Apply 1 application topically 2 (two) times daily.    . TURMERIC PO Take 750 mg by mouth daily.     No facility-administered medications prior to visit.      EXAM:  BP 132/68 (BP Location: Left Arm, Patient Position: Sitting, Cuff Size: Normal)   Pulse 72   Temp 98.1 F (36.7 C) (Oral)   Ht 5\' 4"  (1.626 m)   Wt 147 lb 6.4 oz (66.9 kg)   BMI 25.30 kg/m    Body mass index is 25.3 kg/m.  GENERAL: vitals reviewed and listed above, alert, oriented, appears well hydrated and in no acute distress HEENT: atraumatic, conjunctiva  clear, no obvious abnormalities on inspection of external nose and ears OP : no lesion edema or exudate lower lip with crusted area left lateral with some medial note using upper lip is normal no active blister pus or redness outside of initial lesion. PSYCH: pleasant and cooperative, no obvious depression or anxiety Lab Results  Component Value Date   WBC 6.0 02/03/2018   HGB 13.1 02/03/2018   HCT 38.0 02/03/2018   PLT 235.0 02/03/2018   GLUCOSE 88 02/03/2018   CHOL 270 (H) 02/03/2018   TRIG 143.0 02/03/2018   HDL 76.10 02/03/2018   LDLDIRECT 144.5 01/11/2013   LDLCALC 165 (H) 02/03/2018   ALT 17 02/03/2018   AST 19 02/03/2018   NA 139 02/03/2018   K 4.2 02/03/2018   CL 103 02/03/2018   CREATININE 0.87 02/03/2018   BUN 12 02/03/2018   CO2 28 02/03/2018   TSH 0.90 02/03/2018   BP Readings from Last 3 Encounters:  03/25/18 132/68  02/03/18 124/68  10/05/17 110/62    ASSESSMENT AND PLAN:  Discussed the following assessment and plan:  Recurrent herpes labialis  Medication management Agree with diagnosis of recurrent cold sore herpes labialis.  Discussed pathophysiology infection triggers.  Topicals are not that effective Suggest high-dose Valtrex at onset to prevent full-blown cases not sure that today's situation would respond to the Valtrex but she can try it if she feels it is extending. Make sure that her lipsticks and topicals are renewed to avoid spread.  -Patient advised to return or notify health care team  if  new concerns arise.  Patient Instructions  Take valtrex 1000 mg 2 twice a day at onset     Usually only need 1-2 days at that dose .  Best use taken early .  Of can take 1000 mg twice a day early .   So you were given 500 mg  So take  2-4 at a  time twice a day until better   todays  may not work for todays but ok to tryu  Cold Sore  A cold sore, also called a fever blister, is a small, fluid-filled sore that forms inside the mouth or on the lips, gums, nose, chin, or cheeks. Cold sores can spread to other parts of the body, such as the eyes or fingers. In some people who have other medical conditions, cold sores can spread to multiple other body sites, including the genitals. Cold sores can spread from person to person (are contagious) until the sores crust over completely. Most cold sores go away within 2 weeks. What are the causes? Cold sores are caused by an infection from a common type of herpes simplex virus (HSV-1). HSV-1 is closely related to the HSV-2virus, which is the virus that causes genital herpes, but these viruses are not the same. Once a person is infected with HSV-1, the virus remains permanently in the body. HSV-1 is spread from person to person through close contact, such as through kissing, touching the affected area, or sharing personal items such as lip balm, razors, a drinking glass, or eating utensils. What increases the risk? You are more likely to develop this condition if you:  Are tired, stressed, or sick.  Are menstruating.  Are pregnant.  Take certain medicines.  Are exposed to cold weather or too much sun. What are the signs or symptoms? Symptoms of a cold sore outbreak go through different stages. These are the stages of a cold sore:  Tingling, itching, or burning is felt 1-2 days before the outbreak.  Fluid-filled blisters appear on the lips, inside the mouth, on the nose, or on the cheeks.  The blisters start to ooze clear fluid.  The blisters dry up, and a yellow crust appears in their place.  The crust falls off. In some cases, other symptoms can develop during a cold sore outbreak. These can include:  Fever.  Sore throat.  Headache.  Muscle aches.  Swollen neck glands. How is this diagnosed? This condition is  diagnosed based on your medical history and a physical exam. Your health care provider may do a blood test or may swab some fluid from your sore and then examine the swab in the lab. How is this treated? There is no cure for cold sores or HSV-1. There is also no vaccine for HSV-1. Most cold sores go away on their own without treatment within 2 weeks. Medicines cannot make the infection go away, but your health care provider may prescribe medicines to:  Help relieve some of the pain associated with the sores.  Work to stop the virus from multiplying.  Shorten healing time. Medicines may be in the form of creams, gels, pills, or a shot. Follow these instructions at home: Medicines  Take or apply over-the-counter and prescription medicines only as told by your health care provider.  Use a cotton-tip swab to apply creams or gels to your sores.  Ask your health care provider if you can take lysine supplements. Research has found that lysine may help heal the cold sore faster and prevent outbreaks. Sore care   Do not touch the sores or pick the scabs.  Wash your hands often. Do not touch your eyes without washing your hands first.  Keep the sores clean and dry.  If directed, apply ice to the sores: ? Put ice in a plastic bag. ? Place a towel between your skin and the bag. ? Leave the  ice on for 20 minutes, 2-3 times a day. Eating and drinking  Eat a soft, bland diet. Avoid eating hot, cold, or salty foods.  Use a straw if it hurts to drink out of a glass.  Eat foods that are rich in lysine, such as meat, fish, and dairy products.  Avoid sugary foods, chocolates, nuts, and grains. These foods are rich in a nutrient called arginine, which can cause the virus to multiply. Lifestyle  Do not kiss, have oral sex, or share personal items until your sores heal.  Stress, poor sleep, and being out in the sun can trigger outbreaks. Make sure you: ? Do activities that help you relax, such  as deep breathing exercises or meditation. ? Get enough sleep. ? Apply sunscreen on your lips before you go out in the sun. Contact a health care provider if:  You have symptoms for more than 2 weeks.  You have pus coming from the sores.  You have redness that is spreading.  You have pain or irritation in your eye.  You get sores on your genitals.  Your sores do not heal within 2 weeks.  You have frequent cold sore outbreaks. Get help right away if you have:  A fever and your symptoms suddenly get worse.  A headache and confusion.  Fatigue or loss of appetite.  A stiff neck or sensitivity to light. Summary  A cold sore, also called a fever blister, is a small, fluid-filled sore that forms inside the mouth or on the lips, gums, nose, chin, or cheeks.  Most cold sores go away on their own without treatment within 2 weeks. Your health care provider may prescribe medicines to help relieve some of the pain, work to stop the virus from multiplying, and shorten healing time.  Wash your hands often. Do not touch your eyes without washing your hands first.  Do not kiss, have oral sex, or share personal items until your sores heal.  Contact a health care provider if your sores do not heal within 2 weeks. This information is not intended to replace advice given to you by your health care provider. Make sure you discuss any questions you have with your health care provider. Document Released: 01/10/2000 Document Revised: 06/14/2017 Document Reviewed: 06/14/2017 Elsevier Interactive Patient Education  2019 [place] K. Panosh [name]

## 2018-03-25 NOTE — Patient Instructions (Signed)
Take valtrex 1000 mg 2 twice a day at onset     Usually only need 1-2 days at that dose .  Best use taken early .  Of can take 1000 mg twice a day early .   So you were given 500 mg  So take  2-4 at a time twice a day until better   todays may not work for todays but ok to tryu  Cold Sore  A cold sore, also called a fever blister, is a small, fluid-filled sore that forms inside the mouth or on the lips, gums, nose, chin, or cheeks. Cold sores can spread to other parts of the body, such as the eyes or fingers. In some people who have other medical conditions, cold sores can spread to multiple other body sites, including the genitals. Cold sores can spread from person to person (are contagious) until the sores crust over completely. Most cold sores go away within 2 weeks. What are the causes? Cold sores are caused by an infection from a common type of herpes simplex virus (HSV-1). HSV-1 is closely related to the HSV-2virus, which is the virus that causes genital herpes, but these viruses are not the same. Once a person is infected with HSV-1, the virus remains permanently in the body. HSV-1 is spread from person to person through close contact, such as through kissing, touching the affected area, or sharing personal items such as lip balm, razors, a drinking glass, or eating utensils. What increases the risk? You are more likely to develop this condition if you:  Are tired, stressed, or sick.  Are menstruating.  Are pregnant.  Take certain medicines.  Are exposed to cold weather or too much sun. What are the signs or symptoms? Symptoms of a cold sore outbreak go through different stages. These are the stages of a cold sore:  Tingling, itching, or burning is felt 1-2 days before the outbreak.  Fluid-filled blisters appear on the lips, inside the mouth, on the nose, or on the cheeks.  The blisters start to ooze clear fluid.  The blisters dry up, and a yellow crust appears in their  place.  The crust falls off. In some cases, other symptoms can develop during a cold sore outbreak. These can include:  Fever.  Sore throat.  Headache.  Muscle aches.  Swollen neck glands. How is this diagnosed? This condition is diagnosed based on your medical history and a physical exam. Your health care provider may do a blood test or may swab some fluid from your sore and then examine the swab in the lab. How is this treated? There is no cure for cold sores or HSV-1. There is also no vaccine for HSV-1. Most cold sores go away on their own without treatment within 2 weeks. Medicines cannot make the infection go away, but your health care provider may prescribe medicines to:  Help relieve some of the pain associated with the sores.  Work to stop the virus from multiplying.  Shorten healing time. Medicines may be in the form of creams, gels, pills, or a shot. Follow these instructions at home: Medicines  Take or apply over-the-counter and prescription medicines only as told by your health care provider.  Use a cotton-tip swab to apply creams or gels to your sores.  Ask your health care provider if you can take lysine supplements. Research has found that lysine may help heal the cold sore faster and prevent outbreaks. Sore care   Do not touch the sores  or pick the scabs.  Wash your hands often. Do not touch your eyes without washing your hands first.  Keep the sores clean and dry.  If directed, apply ice to the sores: ? Put ice in a plastic bag. ? Place a towel between your skin and the bag. ? Leave the ice on for 20 minutes, 2-3 times a day. Eating and drinking  Eat a soft, bland diet. Avoid eating hot, cold, or salty foods.  Use a straw if it hurts to drink out of a glass.  Eat foods that are rich in lysine, such as meat, fish, and dairy products.  Avoid sugary foods, chocolates, nuts, and grains. These foods are rich in a nutrient called arginine, which can  cause the virus to multiply. Lifestyle  Do not kiss, have oral sex, or share personal items until your sores heal.  Stress, poor sleep, and being out in the sun can trigger outbreaks. Make sure you: ? Do activities that help you relax, such as deep breathing exercises or meditation. ? Get enough sleep. ? Apply sunscreen on your lips before you go out in the sun. Contact a health care provider if:  You have symptoms for more than 2 weeks.  You have pus coming from the sores.  You have redness that is spreading.  You have pain or irritation in your eye.  You get sores on your genitals.  Your sores do not heal within 2 weeks.  You have frequent cold sore outbreaks. Get help right away if you have:  A fever and your symptoms suddenly get worse.  A headache and confusion.  Fatigue or loss of appetite.  A stiff neck or sensitivity to light. Summary  A cold sore, also called a fever blister, is a small, fluid-filled sore that forms inside the mouth or on the lips, gums, nose, chin, or cheeks.  Most cold sores go away on their own without treatment within 2 weeks. Your health care provider may prescribe medicines to help relieve some of the pain, work to stop the virus from multiplying, and shorten healing time.  Wash your hands often. Do not touch your eyes without washing your hands first.  Do not kiss, have oral sex, or share personal items until your sores heal.  Contact a health care provider if your sores do not heal within 2 weeks. This information is not intended to replace advice given to you by your health care provider. Make sure you discuss any questions you have with your health care provider. Document Released: 01/10/2000 Document Revised: 06/14/2017 Document Reviewed: 06/14/2017 Elsevier Interactive Patient Education  2019 [employer].

## 2018-05-02 MED ORDER — LOSARTAN POTASSIUM 50 MG PO TABS: 50.0000 mg | ORAL_TABLET | Freq: Every day | ORAL | 3 refills | Status: DC

## 2018-07-07 ENCOUNTER — Ambulatory Visit: Payer: Self-pay

## 2018-07-07 DIAGNOSIS — Z1231 Encounter for screening mammogram for malignant neoplasm of breast: Secondary | ICD-10-CM | POA: Diagnosis not present

## 2018-07-07 DIAGNOSIS — Z803 Family history of malignant neoplasm of breast: Secondary | ICD-10-CM | POA: Diagnosis not present

## 2018-07-07 LAB — HM MAMMOGRAPHY

## 2018-07-07 NOTE — Telephone Encounter (Signed)
Patient referred to Urgent Care.

## 2018-07-07 NOTE — Telephone Encounter (Signed)
Incoming call fom Patient reporting that on Monday she closed the truck door on her thumb.  It was on the left thumb near the knuckle .T         Turneded black and blue. There is a lot of bruising.  Pain is rated mild to moderate.  Did not break the skin.  Last tetanus 20017 Attempted to transfer call to office.  No ans.  I. Informed patient that office will call her back at that time she would be requesting  Appoint. Patient request return call.                                                                                                           Reason for Disposition . [1] Skin around the wound has become red AND [2] larger than 2 inches (5 cm)  Answer Assessment - Initial Assessment Questions 1. MECHANISM: "How did the injury happen?"     Shut in tuck door 2. ONSET: "When did the injury happen?" (Minutes or hours ago)      Monday 3. LOCATION: "What part of the finger is injured?" "Is the nail damaged?"     thumb above the nuckle 4. APPEARANCE of the INJURY: "What does the injury look like?"      Turn black  5. SEVERITY: "Can you use the hand normally?"  "Can you bend your fingers into a ball and then fully open them?"     * able to bend the thumb6. SIZE: For cuts, bruises, or swelling, ask: "How large is it?" (e.g., inches or centimeters;  entire finger)     A lot of bruising  7. PAIN: "Is there pain?" If so, ask: "How bad is the pain?"    (e.g., Scale 1-10; or mild, moderate, severe)     throb s some mild to moderate 8. TETANUS: For any breaks in the skin, ask: "When was the last tetanus booster?"      9. OTHER SYMPTOMS: "Do you have any other symptoms?"      10. PREGNANCY: "Is there any chance you are pregnant?" "When was your last menstrual period?"  Protocols used: WOUND INFECTION-A-AH, FINGER INJURY-A-AH

## 2018-07-08 DIAGNOSIS — S6702XA Crushing injury of left thumb, initial encounter: Secondary | ICD-10-CM | POA: Diagnosis not present

## 2018-07-15 DIAGNOSIS — S6702XA Crushing injury of left thumb, initial encounter: Secondary | ICD-10-CM | POA: Diagnosis not present

## 2018-07-20 DIAGNOSIS — Z6824 Body mass index (BMI) 24.0-24.9, adult: Secondary | ICD-10-CM | POA: Diagnosis not present

## 2018-07-20 DIAGNOSIS — Z01419 Encounter for gynecological examination (general) (routine) without abnormal findings: Secondary | ICD-10-CM | POA: Diagnosis not present

## 2018-08-15 ENCOUNTER — Encounter: Payer: Self-pay | Admitting: Internal Medicine

## 2018-08-15 DIAGNOSIS — S6702XD Crushing injury of left thumb, subsequent encounter: Secondary | ICD-10-CM | POA: Diagnosis not present

## 2018-08-15 DIAGNOSIS — S6702XA Crushing injury of left thumb, initial encounter: Secondary | ICD-10-CM | POA: Diagnosis not present

## 2018-08-19 DIAGNOSIS — H903 Sensorineural hearing loss, bilateral: Secondary | ICD-10-CM | POA: Diagnosis not present

## 2018-09-05 DIAGNOSIS — D1801 Hemangioma of skin and subcutaneous tissue: Secondary | ICD-10-CM | POA: Diagnosis not present

## 2018-09-05 DIAGNOSIS — L821 Other seborrheic keratosis: Secondary | ICD-10-CM | POA: Diagnosis not present

## 2018-09-05 DIAGNOSIS — D225 Melanocytic nevi of trunk: Secondary | ICD-10-CM | POA: Diagnosis not present

## 2018-09-05 DIAGNOSIS — Z85820 Personal history of malignant melanoma of skin: Secondary | ICD-10-CM | POA: Diagnosis not present

## 2018-09-05 DIAGNOSIS — Z85828 Personal history of other malignant neoplasm of skin: Secondary | ICD-10-CM | POA: Diagnosis not present

## 2018-09-26 DIAGNOSIS — S6702XD Crushing injury of left thumb, subsequent encounter: Secondary | ICD-10-CM | POA: Diagnosis not present

## 2018-10-11 DIAGNOSIS — H40013 Open angle with borderline findings, low risk, bilateral: Secondary | ICD-10-CM | POA: Diagnosis not present

## 2018-10-11 DIAGNOSIS — H524 Presbyopia: Secondary | ICD-10-CM | POA: Diagnosis not present

## 2018-10-31 ENCOUNTER — Encounter: Payer: Self-pay | Admitting: Internal Medicine

## 2018-10-31 ENCOUNTER — Other Ambulatory Visit: Payer: Self-pay | Admitting: Registered"

## 2018-10-31 ENCOUNTER — Other Ambulatory Visit: Payer: Self-pay

## 2018-10-31 ENCOUNTER — Telehealth (INDEPENDENT_AMBULATORY_CARE_PROVIDER_SITE_OTHER): Payer: PPO | Admitting: Internal Medicine

## 2018-10-31 DIAGNOSIS — R509 Fever, unspecified: Secondary | ICD-10-CM | POA: Diagnosis not present

## 2018-10-31 DIAGNOSIS — R197 Diarrhea, unspecified: Secondary | ICD-10-CM

## 2018-10-31 DIAGNOSIS — Z20822 Contact with and (suspected) exposure to covid-19: Secondary | ICD-10-CM

## 2018-10-31 DIAGNOSIS — Z20828 Contact with and (suspected) exposure to other viral communicable diseases: Secondary | ICD-10-CM | POA: Diagnosis not present

## 2018-10-31 NOTE — Progress Notes (Signed)
Virtual Visit via Video Note  I connected with@ on 10/31/18 at  9:15 AM EDT by a video enabled telemedicine application and verified that I am speaking with the correct person using two identifiers. Location patient: home Location provider:work office Persons participating in the virtual visit: patient, provider  WIth national recommendations  regarding COVID 19 pandemic   video visit is advised over in office visit for this patient.  Patient aware  of the limitations of evaluation and management by telemedicine and  availability of in person appointments. and agreed to proceed.  after 8 minutes of attempt she  could see and hear me but I not her  So called and used tele visit also   HPI: Sheila Livingston presents for video visit Onset of fatigue last weeka nd t hen OCto 2 had fever  And tired and then loose watery diarrhea about 9  Per 24 hours and none since last night  No vomiting was ave to eat yesterday .    No cough but mild resp congestion .  hsuband with some illness Last week helped move niece with family sister and  Husband  But none are ill .  isoalted mostly going to grocery store eating out a lon g time ago  Etc   ROS: See pertinent positives and negatives per HPI. No cp sob  Serious cough rash   Past Medical History:  Diagnosis Date  . Allergy   . Anal fissure   . Arthritis    hands  . Benign paroxysmal positional vertigo   . Complication of anesthesia   . Endometriosis   . Hemorrhoids   . History of skin cancer   . Hyperlipidemia   . Hyperplastic colon polyp   . Hypertension   . IBS (irritable bowel syndrome)   . Melanoma ([place]) 06/10/2016   right thumb   . Melanoma in situ ([place]) 05/31/2016  . PONV (postoperative nausea and vomiting)     Past Surgical History:  Procedure Laterality Date  . ABDOMINAL HYSTERECTOMY     bso enometriosis; no cancer  . AMPUTATION Right 06/02/2016   Procedure: AMPUTATION RIGHT THUMB INTERPHALANGEAL JOINT;  Surgeon: Daryll Brod, MD;   Location: [place];  Service: Orthopedics;  Laterality: Right;  . BUNIONECTOMY Left 11/23/2008  . NAILBED REPAIR Right 05/12/2016   Procedure: EXCISION OF NAIL MATRIX BED RIGHT;  Surgeon: Daryll Brod, MD;  Location: [place];  Service: Orthopedics;  Laterality: Right;  . ROTATOR CUFF REPAIR Right 10/2009  . SKIN BIOPSY  03/16/2017   shave biopsy, right inferior helix, chondrodermatits nodularis helicis  . SKIN FULL THICKNESS GRAFT Right 05/12/2016   Procedure: UPPER ARM SKIN GRAFT RIGHT THUMB;  Surgeon: Daryll Brod, MD;  Location: [facility];  Service: Orthopedics;  Laterality: Right;  . thumb nail surgery Right     Family History  Problem Relation Age of Onset  . Diabetes Mother   . Stroke Mother   . Breast cancer Sister   . Colon polyps Sister   . Diabetes Sister   . Colon polyps Sister   . Colon cancer Neg Hx     Social History   Tobacco Use  . Smoking status: Former Research scientist (life sciences)  . Smokeless tobacco: Never Used  Substance Use Topics  . Alcohol use: Yes    Alcohol/week: 0.0 standard drinks    Comment: rare  . Drug use: No      Current Outpatient Medications:  .  aspirin 81 MG chewable  tablet, Chew 81 mg by mouth daily.  , Disp: , Rfl:  .  Cholecalciferol (VITAMIN D3) 400 UNITS CAPS, Take 1 capsule by mouth daily., Disp: , Rfl:  .  docusate sodium (COLACE) 100 MG capsule, Take 100 mg by mouth 2 (two) times daily., Disp: , Rfl:  .  Flaxseed, Linseed, (FLAX SEED OIL PO), Take by mouth.  , Disp: , Rfl:  .  losartan (COZAAR) 50 MG tablet, Take 1 tablet (50 mg total) by mouth daily., Disp: 90 tablet, Rfl: 3 .  LYSINE PO, Take by mouth., Disp: , Rfl:  .  MULTIPLE VITAMIN PO, Take by mouth.  , Disp: , Rfl:  .  Omega-3 Fatty Acids (FISH OIL PO), Take by mouth., Disp: , Rfl:  .  triamcinolone cream (KENALOG) 0.1 %, Apply 1 application topically 2 (two) times daily., Disp: , Rfl:  .  TURMERIC PO, Take 750 mg by mouth daily., Disp: , Rfl:   .  valACYclovir (VALTREX) 1000 MG tablet, Take 2 tablets (2,000 mg total) by mouth 2 (two) times daily. As needed for cold sores, Disp: 30 tablet, Rfl: 3  EXAM: BP Readings from Last 3 Encounters:  03/25/18 132/68  02/03/18 124/68  10/05/17 110/62    VITALS per patient if applicable:  GENERAL: alert, oriented,  Sounds mildy congesteed no cough  LUNGS:  no signs of respiratory distress, breathing rate appears normal, no obvious gross SOB, gasping or wheezing PSYCH/NEURO: pleasant and cooperative, no obvious depression or anxiety, speech and thought processing grossly intact   ASSESSMENT AND PLAN:  Discussed the following assessment and plan:    ICD-10-CM   1. Fever, unspecified fever cause  R50.9 CANCELED: Novel Coronavirus, NAA ([facility])  2. Acute diarrhea  R19.7 CANCELED: Novel Coronavirus, NAA ([facility])   Fever diarrhea   np specific exposures except tof maily members with a move  [place]  in 55 age group husband also milsly ill seems to be recoviering  But check  For sars 2cov    Family member to travel to [place] on Oct 8   Counseled.  About any didsease transimission and  Advice around covid  19 specifically   To get tested today for covid and still  Maintain isolated as discussed   Expectant management and discussion of plan and treatment with opportunity to ask questions and all were answered. The patient agreed with the plan and demonstrated an understanding of the instructions.   Advised to call back or seek an in-person evaluation if worsening  or having  further concerns .  I provided 17 minutes of non-face-to-face time during this encounter.   Shanon Ace, MD

## 2018-11-02 LAB — NOVEL CORONAVIRUS, NAA: SARS-CoV-2, NAA: DETECTED — AB

## 2018-11-02 NOTE — Telephone Encounter (Signed)
Ok thanks  For the info   So oct 8 is 14 days since contact and if she doesn't have any sx should be   Ok to go with precaution.

## 2018-11-02 NOTE — Telephone Encounter (Signed)
Please see my result note .    Close contacts need to isolate for 14 days since exposure to you   Your niece should not travel   For the next 14 days .  You need to notify the family that was together with the move  That they may have been exposed to covid     And you should self isolate at home  But contact us  agin if  [place] you are continuing to feel better    [email or web address]  We can talk again with /s  But check out the  cdc web site  Info   First

## 2019-02-09 DIAGNOSIS — H833X9 Noise effects on inner ear, unspecified ear: Secondary | ICD-10-CM | POA: Diagnosis not present

## 2019-02-09 DIAGNOSIS — H903 Sensorineural hearing loss, bilateral: Secondary | ICD-10-CM | POA: Diagnosis not present

## 2019-02-14 ENCOUNTER — Other Ambulatory Visit: Payer: Self-pay

## 2019-02-14 ENCOUNTER — Ambulatory Visit (INDEPENDENT_AMBULATORY_CARE_PROVIDER_SITE_OTHER): Payer: Medicare HMO | Admitting: Internal Medicine

## 2019-02-14 ENCOUNTER — Encounter: Payer: Self-pay | Admitting: Internal Medicine

## 2019-02-14 VITALS — BP 118/64 | HR 80 | Temp 97.7°F | Ht 64.0 in | Wt 148.4 lb

## 2019-02-14 DIAGNOSIS — Z0000 Encounter for general adult medical examination without abnormal findings: Secondary | ICD-10-CM | POA: Diagnosis not present

## 2019-02-14 DIAGNOSIS — I10 Essential (primary) hypertension: Secondary | ICD-10-CM

## 2019-02-14 DIAGNOSIS — Z8616 Personal history of COVID-19: Secondary | ICD-10-CM | POA: Diagnosis not present

## 2019-02-14 DIAGNOSIS — E785 Hyperlipidemia, unspecified: Secondary | ICD-10-CM

## 2019-02-14 DIAGNOSIS — Z79899 Other long term (current) drug therapy: Secondary | ICD-10-CM | POA: Diagnosis not present

## 2019-02-14 LAB — CBC WITH DIFFERENTIAL/PLATELET
Basophils Absolute: 0 K/uL (ref 0.0–0.1)
Basophils Relative: 0.4 % (ref 0.0–3.0)
Eosinophils Absolute: 0.2 K/uL (ref 0.0–0.7)
Eosinophils Relative: 2.9 % (ref 0.0–5.0)
HCT: 40.1 % (ref 36.0–46.0)
Hemoglobin: 13.5 g/dL (ref 12.0–15.0)
Lymphocytes Relative: 38.9 % (ref 12.0–46.0)
Lymphs Abs: 2.4 K/uL (ref 0.7–4.0)
MCHC: 33.5 g/dL (ref 30.0–36.0)
MCV: 93 fl (ref 78.0–100.0)
Monocytes Absolute: 0.5 K/uL (ref 0.1–1.0)
Monocytes Relative: 8.3 % (ref 3.0–12.0)
Neutro Abs: 3.1 K/uL (ref 1.4–7.7)
Neutrophils Relative %: 49.5 % (ref 43.0–77.0)
Platelets: 240 K/uL (ref 150.0–400.0)
RBC: 4.31 Mil/uL (ref 3.87–5.11)
RDW: 13.6 % (ref 11.5–15.5)
WBC: 6.2 K/uL (ref 4.0–10.5)

## 2019-02-14 LAB — LIPID PANEL
Cholesterol: 245 mg/dL — ABNORMAL HIGH (ref 0–200)
HDL: 73.2 mg/dL
NonHDL: 171.77
Total CHOL/HDL Ratio: 3
Triglycerides: 219 mg/dL — ABNORMAL HIGH (ref 0.0–149.0)
VLDL: 43.8 mg/dL — ABNORMAL HIGH (ref 0.0–40.0)

## 2019-02-14 LAB — HEPATIC FUNCTION PANEL
ALT: 19 U/L (ref 0–35)
AST: 18 U/L (ref 0–37)
Albumin: 4.4 g/dL (ref 3.5–5.2)
Alkaline Phosphatase: 71 U/L (ref 39–117)
Bilirubin, Direct: 0.1 mg/dL (ref 0.0–0.3)
Total Bilirubin: 0.5 mg/dL (ref 0.2–1.2)
Total Protein: 7 g/dL (ref 6.0–8.3)

## 2019-02-14 LAB — BASIC METABOLIC PANEL
BUN: 11 mg/dL (ref 6–23)
CO2: 28 mEq/L (ref 19–32)
Calcium: 9.8 mg/dL (ref 8.4–10.5)
Chloride: 102 mEq/L (ref 96–112)
Creatinine, Ser: 0.82 mg/dL (ref 0.40–1.20)
GFR: 67.83 mL/min (ref >60.00)
Glucose, Bld: 91 mg/dL (ref 70–99)
Potassium: 4.2 mEq/L (ref 3.5–5.1)
Sodium: 139 mEq/L (ref 135–145)

## 2019-02-14 LAB — LDL CHOLESTEROL, DIRECT: Direct LDL: 143 mg/dL

## 2019-02-14 MED ORDER — LOSARTAN POTASSIUM 50 MG PO TABS: 50.0000 mg | ORAL_TABLET | Freq: Every day | ORAL | 3 refills | Status: DC

## 2019-02-14 NOTE — Patient Instructions (Addendum)
Will notify you  of labs when available.   Refill bp med  Still advise covid vaccine as discussed   Preventive Care 30 Years and Older, Female Preventive care refers to lifestyle choices and visits with your health care provider that can promote health and wellness. This includes:  A yearly physical exam. This is also called an annual well check.  Regular dental and eye exams.  Immunizations.  Screening for certain conditions.  Healthy lifestyle choices, such as diet and exercise. What can I expect for my preventive care visit? Physical exam Your health care provider will check:  Height and weight. These may be used to calculate body mass index (BMI), which is a measurement that tells if you are at a healthy weight.  Heart rate and blood pressure.  Your skin for abnormal spots. Counseling Your health care provider may ask you questions about:  Alcohol, tobacco, and drug use.  Emotional well-being.  Home and relationship well-being.  Sexual activity.  Eating habits.  History of falls.  Memory and ability to understand (cognition).  Work and work Statistician.  Pregnancy and menstrual history. What immunizations do I need?  Influenza (flu) vaccine  This is recommended every year. Tetanus, diphtheria, and pertussis (Tdap) vaccine  You may need a Td booster every 10 years. Varicella (chickenpox) vaccine  You may need this vaccine if you have not already been vaccinated. Zoster (shingles) vaccine  You may need this after age 49. Pneumococcal conjugate (PCV13) vaccine  One dose is recommended after age 27. Pneumococcal polysaccharide (PPSV23) vaccine  One dose is recommended after age 14. Measles, mumps, and rubella (MMR) vaccine  You may need at least one dose of MMR if you were born in 1957 or later. You may also need a second dose. Meningococcal conjugate (MenACWY) vaccine  You may need this if you have certain conditions. Hepatitis A  vaccine  You may need this if you have certain conditions or if you travel or work in places where you may be exposed to hepatitis A. Hepatitis B vaccine  You may need this if you have certain conditions or if you travel or work in places where you may be exposed to hepatitis B. Haemophilus influenzae type b (Hib) vaccine  You may need this if you have certain conditions. You may receive vaccines as individual doses or as more than one vaccine together in one shot (combination vaccines). Talk with your health care provider about the risks and benefits of combination vaccines. What tests do I need? Blood tests  Lipid and cholesterol levels. These may be checked every 5 years, or more frequently depending on your overall health.  Hepatitis C test.  Hepatitis B test. Screening  Lung cancer screening. You may have this screening every year starting at age 69 if you have a 30-pack-year history of smoking and currently smoke or have quit within the past 15 years.  Colorectal cancer screening. All adults should have this screening starting at age 15 and continuing until age 59. Your health care provider may recommend screening at age 56 if you are at increased risk. You will have tests every 1-10 years, depending on your results and the type of screening test.  Diabetes screening. This is done by checking your blood sugar (glucose) after you have not eaten for a while (fasting). You may have this done every 1-3 years.  Mammogram. This may be done every 1-2 years. Talk with your health care provider about how often you should have regular  mammograms.  BRCA-related cancer screening. This may be done if you have a family history of breast, ovarian, tubal, or peritoneal cancers. Other tests  Sexually transmitted disease (STD) testing.  Bone density scan. This is done to screen for osteoporosis. You may have this done starting at age 70. Follow these instructions at home: Eating and  drinking  Eat a diet that includes fresh fruits and vegetables, whole grains, lean protein, and low-fat dairy products. Limit your intake of foods with high amounts of sugar, saturated fats, and salt.  Take vitamin and mineral supplements as recommended by your health care provider.  Do not drink alcohol if your health care provider tells you not to drink.  If you drink alcohol: ? Limit how much you have to 0-1 drink a day. ? Be aware of how much alcohol is in your drink. In the [place] one drink equals one 12 oz bottle of beer (355 mL), one 5 oz glass of wine (148 mL), or one 1 oz glass of hard liquor (44 mL). Lifestyle  Take daily care of your teeth and gums.  Stay active. Exercise for at least 30 minutes on 5 or more days each week.  Do not use any products that contain nicotine or tobacco, such as cigarettes, e-cigarettes, and chewing tobacco. If you need help quitting, ask your health care provider.  If you are sexually active, practice safe sex. Use a condom or other form of protection in order to prevent STIs (sexually transmitted infections).  Talk with your health care provider about taking a low-dose aspirin or statin. What's next?  Go to your health care provider once a year for a well check visit.  Ask your health care provider how often you should have your eyes and teeth checked.  Stay up to date on all vaccines. This information is not intended to replace advice given to you by your health care provider. Make sure you discuss any questions you have with your health care provider. Document Revised: 01/06/2018 Document Reviewed: 01/06/2018 Elsevier Patient Education  2020 [employer].

## 2019-02-14 NOTE — Progress Notes (Signed)
Chief Complaint  Patient presents with  . Annual Exam    Pt states she is having itching under her arms and is also having tenderness in neck when turning her head     HPI: Sheila Livingston 74 y.o. comes in today for Preventive Medicare exam/ wellness visit .Since last visit. Saw ear   Doc  Left ear loud    And told to take claritin  And asses for hearing assistance  Had covid 19 in fall some ?s  Doing ok  But feels unmotivated t times in covid isolation   Really misses her Y exercise classes  BP :  Taking med  Needs refill  Sees Derm every 6 mos plus  Health Maintenance  Topic Date Due  . TETANUS/TDAP  01/27/2015  . COLONOSCOPY  08/11/2022  . INFLUENZA VACCINE  Completed  . DEXA SCAN  Completed  . Hepatitis C Screening  Completed  . PNA vac Low Risk Adult  Completed   Health Maintenance Review LIFESTYLE:  Exercise:   "Not good"  Misses Y classes.  Tobacco/ETS: no Alcohol:  ocass Sugar beverages: ocass Sleep:6-8  Drug use: no HH:  2  No pets    Hearing:  As above   Vision:  No limitations at present . Last eye check UTD  Safety:  Has smoke detector and wears seat belts. No excess sun exposure. Sees dentist regularly.  Falls:   Tripped   No major injury  .Nutrition: Eats well balanced diet; adequate calcium and vitamin D. No swallowing chewing problems. Is weight watchers life member   Injury: no major injuries in the last six months.  Other healthcare providers:  Reviewed today .  Preventive parameters:   Reviewed   ADLS:   There are no problems or need for assistance  driving, feeding, obtaining food, dressing, toileting and bathing, managing money using phone. She is independent.    ROS:  GEN/ HEENT: No fever, significant weight changes sweats headaches vision problems hearing changes, CV/ PULM; No chest pain shortness of breath cough, syncope,edema  change in exercise tolerance. GI /GU: No adominal pain, vomiting, change in bowel habits. No blood in the stool.  No significant GU symptoms. SKIN/HEME: ,no acute skin rashes suspicious lesions or bleeding. No lymphadenopathy, nodules, masses.  NEURO/ PSYCH:  No neurologic signs such as weakness numbness. No depression anxiety. IMM/ Allergy: No unusual infections.  Allergy .   REST of 12 system review negative except as per HPI   Past Medical History:  Diagnosis Date  . Allergy   . Anal fissure   . Arthritis    hands  . Benign paroxysmal positional vertigo   . Complication of anesthesia   . Endometriosis   . Hemorrhoids   . History of skin cancer   . Hyperlipidemia   . Hyperplastic colon polyp   . Hypertension   . IBS (irritable bowel syndrome)   . Melanoma ([place]) 06/10/2016   right thumb   . Melanoma in situ ([place]) 05/31/2016  . PONV (postoperative nausea and vomiting)     Family History  Problem Relation Age of Onset  . Diabetes Mother   . Stroke Mother   . Breast cancer Sister   . Colon polyps Sister   . Diabetes Sister   . Colon polyps Sister   . Colon cancer Neg Hx       Outpatient Encounter Medications as of 02/14/2019  Medication Sig  . aspirin 81 MG chewable tablet Chew 81 mg by mouth  daily.    . Cholecalciferol (VITAMIN D3) 400 UNITS CAPS Take 1 capsule by mouth daily.  Marland Kitchen docusate sodium (COLACE) 100 MG capsule Take 100 mg by mouth 2 (two) times daily.  Marland Kitchen losartan (COZAAR) 50 MG tablet Take 1 tablet (50 mg total) by mouth daily.  . MULTIPLE VITAMIN PO Take by mouth.    . triamcinolone cream (KENALOG) 0.1 % Apply 1 application topically 2 (two) times daily.  . valACYclovir (VALTREX) 1000 MG tablet Take 2 tablets (2,000 mg total) by mouth 2 (two) times daily. As needed for cold sores  . [DISCONTINUED] losartan (COZAAR) 50 MG tablet Take 1 tablet (50 mg total) by mouth daily.  Marland Kitchen LYSINE PO Take by mouth.  . TURMERIC PO Take 750 mg by mouth daily.  . [DISCONTINUED] Flaxseed, Linseed, (FLAX SEED OIL PO) Take by mouth.    . [DISCONTINUED] Omega-3 Fatty Acids (FISH OIL PO) Take  by mouth.   No facility-administered encounter medications on file as of 02/14/2019.    EXAM:  BP 118/64 (BP Location: Right Arm, Patient Position: Sitting, Cuff Size: Normal)   Pulse 80   Temp 97.7 F (36.5 C) (Temporal)   Ht 5' 4" (1.626 m)   Wt 148 lb 6.4 oz (67.3 kg)   SpO2 99%   BMI 25.47 kg/m   Body mass index is 25.47 kg/m.  Physical Exam: Vital signs reviewed ZOX:WRUE is a well-developed well-nourished alert cooperative   who appears stated age in no acute distress.  HEENT: normocephalic atraumatic , Eyes: PERRL EOM's full, conjunctiva clear, ., Ears: no deformity EAC's clear TMs with normal landmarks. Mouth: clear OP, masked NECK: supple without masses, thyromegaly or bruits. CHEST/PULM:  Clear to auscultation and percussion breath sounds equal no wheeze , rales or rhonchi. No chest Lape deformities or tenderness. Axilla clear  No rash seen  CV: PMI is nondisplaced, S1 S2 no gallops, murmurs, rubs. Peripheral pulses are full without delay.No JVD .  ABDOMEN: Bowel sounds normal nontender  No guard or rebound, no hepato splenomegal no CVA tenderness.   Extremtities:  No clubbing cyanosis or edema, no acute joint swelling or redness no focal atrophy right thumb partal amputation  NEURO:  Oriented x3, cranial nerves 3-12 appear to be intact, no obvious focal weakness,gait within normal limits no abnormal reflexes or asymmetrical SKIN: No acute rashes normal turgor, color, no bruising or petechiae. PSYCH: Oriented, good eye contact, no obvious depression anxiety, cognition and judgment appear normal. LN: no cervical axillary inguinal adenopathy No noted deficits in memory, attention, and speech.   Lab Results  Component Value Date   WBC 6.2 02/14/2019   HGB 13.5 02/14/2019   HCT 40.1 02/14/2019   PLT 240.0 02/14/2019   GLUCOSE 91 02/14/2019   CHOL 245 (H) 02/14/2019   TRIG 219.0 (H) 02/14/2019   HDL 73.20 02/14/2019   LDLDIRECT 143.0 02/14/2019   LDLCALC 165 (H)  02/03/2018   ALT 19 02/14/2019   AST 18 02/14/2019   NA 139 02/14/2019   K 4.2 02/14/2019   CL 102 02/14/2019   CREATININE 0.82 02/14/2019   BUN 11 02/14/2019   CO2 28 02/14/2019   TSH 0.90 02/03/2018    ASSESSMENT AND PLAN:  Discussed the following assessment and plan:  Visit for preventive health examination  History of 2019 novel coronavirus disease (COVID-19) - october 2019  Hyperlipidemia, unspecified hyperlipidemia type - Plan: Basic metabolic panel, CBC with Differential/Platelet, Hepatic function panel, Lipid panel, LDL cholesterol, direct  Medication management - Plan:  Basic metabolic panel, CBC with Differential/Platelet, Hepatic function panel, Lipid panel  Essential hypertension - Plan: Basic metabolic panel, CBC with Differential/Platelet, Hepatic function panel, Lipid panel Update lab today  Disc covid vaccine even if  Had disease as per current guidelines  Return in about 1 year (around 02/14/2020) for preventive /cpx and medications, depending on labs.  Patient Care Team: Panosh, Standley Brooking, MD as PCP - General (Internal Medicine) Sydnee Cabal, MD (Orthopedic Surgery) amy Stangelo  Linda Hedges, DO as Consulting Physician (Obstetrics and Gynecology) Daryll Brod, MD as Consulting Physician (Orthopedic Surgery) Johnathan Hausen, MD as Consulting Physician (General Surgery) Ayesha Mohair (Dermatology)  Patient Instructions  Will notify you  of labs when available.   Refill bp med  Still advise covid vaccine as discussed   Preventive Care 22 Years and Older, Female Preventive care refers to lifestyle choices and visits with your health care provider that can promote health and wellness. This includes:  A yearly physical exam. This is also called an annual well check.  Regular dental and eye exams.  Immunizations.  Screening for certain conditions.  Healthy lifestyle choices, such as diet and exercise. What can I expect for my preventive care  visit? Physical exam Your health care provider will check:  Height and weight. These may be used to calculate body mass index (BMI), which is a measurement that tells if you are at a healthy weight.  Heart rate and blood pressure.  Your skin for abnormal spots. Counseling Your health care provider may ask you questions about:  Alcohol, tobacco, and drug use.  Emotional well-being.  Home and relationship well-being.  Sexual activity.  Eating habits.  History of falls.  Memory and ability to understand (cognition).  Work and work Statistician.  Pregnancy and menstrual history. What immunizations do I need?  Influenza (flu) vaccine  This is recommended every year. Tetanus, diphtheria, and pertussis (Tdap) vaccine  You may need a Td booster every 10 years. Varicella (chickenpox) vaccine  You may need this vaccine if you have not already been vaccinated. Zoster (shingles) vaccine  You may need this after age 65. Pneumococcal conjugate (PCV13) vaccine  One dose is recommended after age 50. Pneumococcal polysaccharide (PPSV23) vaccine  One dose is recommended after age 23. Measles, mumps, and rubella (MMR) vaccine  You may need at least one dose of MMR if you were born in 1957 or later. You may also need a second dose. Meningococcal conjugate (MenACWY) vaccine  You may need this if you have certain conditions. Hepatitis A vaccine  You may need this if you have certain conditions or if you travel or work in places where you may be exposed to hepatitis A. Hepatitis B vaccine  You may need this if you have certain conditions or if you travel or work in places where you may be exposed to hepatitis B. Haemophilus influenzae type b (Hib) vaccine  You may need this if you have certain conditions. You may receive vaccines as individual doses or as more than one vaccine together in one shot (combination vaccines). Talk with your health care provider about the risks and  benefits of combination vaccines. What tests do I need? Blood tests  Lipid and cholesterol levels. These may be checked every 5 years, or more frequently depending on your overall health.  Hepatitis C test.  Hepatitis B test. Screening  Lung cancer screening. You may have this screening every year starting at age 10 if you have a 30-pack-year history  of smoking and currently smoke or have quit within the past 15 years.  Colorectal cancer screening. All adults should have this screening starting at age 30 and continuing until age 29. Your health care provider may recommend screening at age 44 if you are at increased risk. You will have tests every 1-10 years, depending on your results and the type of screening test.  Diabetes screening. This is done by checking your blood sugar (glucose) after you have not eaten for a while (fasting). You may have this done every 1-3 years.  Mammogram. This may be done every 1-2 years. Talk with your health care provider about how often you should have regular mammograms.  BRCA-related cancer screening. This may be done if you have a family history of breast, ovarian, tubal, or peritoneal cancers. Other tests  Sexually transmitted disease (STD) testing.  Bone density scan. This is done to screen for osteoporosis. You may have this done starting at age 45. Follow these instructions at home: Eating and drinking  Eat a diet that includes fresh fruits and vegetables, whole grains, lean protein, and low-fat dairy products. Limit your intake of foods with high amounts of sugar, saturated fats, and salt.  Take vitamin and mineral supplements as recommended by your health care provider.  Do not drink alcohol if your health care provider tells you not to drink.  If you drink alcohol: ? Limit how much you have to 0-1 drink a day. ? Be aware of how much alcohol is in your drink. In the [place] one drink equals one 12 oz bottle of beer (355 mL), one 5 oz glass  of wine (148 mL), or one 1 oz glass of hard liquor (44 mL). Lifestyle  Take daily care of your teeth and gums.  Stay active. Exercise for at least 30 minutes on 5 or more days each week.  Do not use any products that contain nicotine or tobacco, such as cigarettes, e-cigarettes, and chewing tobacco. If you need help quitting, ask your health care provider.  If you are sexually active, practice safe sex. Use a condom or other form of protection in order to prevent STIs (sexually transmitted infections).  Talk with your health care provider about taking a low-dose aspirin or statin. What's next?  Go to your health care provider once a year for a well check visit.  Ask your health care provider how often you should have your eyes and teeth checked.  Stay up to date on all vaccines. This information is not intended to replace advice given to you by your health care provider. Make sure you discuss any questions you have with your health care provider. Document Revised: 01/06/2018 Document Reviewed: 01/06/2018 Elsevier Patient Education  2020 [place] Deddrick Saindon [name]

## 2019-02-15 NOTE — Progress Notes (Signed)
Labs stable except triglyceride  up some ( can bring down with avoiding sweets sugars simple carbs and animal fats and of course  exercise )

## 2019-03-08 DIAGNOSIS — L905 Scar conditions and fibrosis of skin: Secondary | ICD-10-CM | POA: Diagnosis not present

## 2019-03-08 DIAGNOSIS — L821 Other seborrheic keratosis: Secondary | ICD-10-CM | POA: Diagnosis not present

## 2019-03-08 DIAGNOSIS — Z85828 Personal history of other malignant neoplasm of skin: Secondary | ICD-10-CM | POA: Diagnosis not present

## 2019-03-08 DIAGNOSIS — D1801 Hemangioma of skin and subcutaneous tissue: Secondary | ICD-10-CM | POA: Diagnosis not present

## 2019-03-08 DIAGNOSIS — L309 Dermatitis, unspecified: Secondary | ICD-10-CM | POA: Diagnosis not present

## 2019-03-08 DIAGNOSIS — D485 Neoplasm of uncertain behavior of skin: Secondary | ICD-10-CM | POA: Diagnosis not present

## 2019-03-08 DIAGNOSIS — C44529 Squamous cell carcinoma of skin of other part of trunk: Secondary | ICD-10-CM | POA: Diagnosis not present

## 2019-03-08 DIAGNOSIS — D225 Melanocytic nevi of trunk: Secondary | ICD-10-CM | POA: Diagnosis not present

## 2019-03-14 DIAGNOSIS — C44529 Squamous cell carcinoma of skin of other part of trunk: Secondary | ICD-10-CM | POA: Diagnosis not present

## 2019-03-26 DIAGNOSIS — E2839 Other primary ovarian failure: Secondary | ICD-10-CM

## 2019-03-27 NOTE — Telephone Encounter (Signed)
Please order  dexa at [facility]   Dx   Estrogen deficient

## 2019-05-15 DIAGNOSIS — Z85828 Personal history of other malignant neoplasm of skin: Secondary | ICD-10-CM | POA: Diagnosis not present

## 2019-05-15 DIAGNOSIS — L905 Scar conditions and fibrosis of skin: Secondary | ICD-10-CM | POA: Diagnosis not present

## 2019-06-17 DIAGNOSIS — H524 Presbyopia: Secondary | ICD-10-CM | POA: Diagnosis not present

## 2019-06-24 DIAGNOSIS — R300 Dysuria: Secondary | ICD-10-CM | POA: Diagnosis not present

## 2019-06-24 DIAGNOSIS — N3001 Acute cystitis with hematuria: Secondary | ICD-10-CM | POA: Diagnosis not present

## 2019-07-04 NOTE — Telephone Encounter (Signed)
Please order  Urinalysis with microscopic and urine culture  And  Fasting lipid panel   At the [facility] so she can get this before her visit with me .

## 2019-07-05 ENCOUNTER — Other Ambulatory Visit (INDEPENDENT_AMBULATORY_CARE_PROVIDER_SITE_OTHER): Payer: Medicare HMO

## 2019-07-05 ENCOUNTER — Other Ambulatory Visit: Payer: Self-pay

## 2019-07-05 DIAGNOSIS — E782 Mixed hyperlipidemia: Secondary | ICD-10-CM

## 2019-07-05 DIAGNOSIS — R399 Unspecified symptoms and signs involving the genitourinary system: Secondary | ICD-10-CM | POA: Diagnosis not present

## 2019-07-05 LAB — URINALYSIS, ROUTINE W REFLEX MICROSCOPIC
Bilirubin Urine: NEGATIVE
Hgb urine dipstick: NEGATIVE
Ketones, ur: NEGATIVE
Leukocytes,Ua: NEGATIVE
Nitrite: NEGATIVE
RBC / HPF: NONE SEEN (ref 0–?)
Specific Gravity, Urine: 1.02 (ref 1.000–1.030)
Total Protein, Urine: NEGATIVE
Urine Glucose: NEGATIVE
Urobilinogen, UA: 0.2 (ref 0.0–1.0)
pH: 6 (ref 5.0–8.0)

## 2019-07-05 LAB — LIPID PANEL
Cholesterol: 212 mg/dL — ABNORMAL HIGH (ref 0–200)
HDL: 61.6 mg/dL (ref >39.00)
LDL Cholesterol: 131 mg/dL — ABNORMAL HIGH (ref 0–99)
NonHDL: 150.5
Total CHOL/HDL Ratio: 3
Triglycerides: 100 mg/dL (ref 0.0–149.0)
VLDL: 20 mg/dL (ref 0.0–40.0)

## 2019-07-06 ENCOUNTER — Other Ambulatory Visit: Payer: Self-pay

## 2019-07-06 LAB — URINE CULTURE
MICRO NUMBER:: 10571048
SPECIMEN QUALITY:: ADEQUATE

## 2019-07-07 ENCOUNTER — Ambulatory Visit (INDEPENDENT_AMBULATORY_CARE_PROVIDER_SITE_OTHER): Payer: Medicare HMO | Admitting: Internal Medicine

## 2019-07-07 ENCOUNTER — Encounter: Payer: Self-pay | Admitting: Internal Medicine

## 2019-07-07 VITALS — BP 130/84 | HR 88 | Temp 97.9°F | Ht 64.0 in | Wt 145.8 lb

## 2019-07-07 DIAGNOSIS — E782 Mixed hyperlipidemia: Secondary | ICD-10-CM

## 2019-07-07 DIAGNOSIS — R399 Unspecified symptoms and signs involving the genitourinary system: Secondary | ICD-10-CM | POA: Diagnosis not present

## 2019-07-07 NOTE — Progress Notes (Signed)
Chief Complaint  Patient presents with  . Follow-up    Discuss labs, recent UTI, check cholesterol    HPI: Sheila Livingston 74 y.o. come in for Fu uti  And lipids  Was rx for uti with burning  and  Blood in urine   At [place]   A week of medication    And doing ok  Felt a lot better  After rx.   Still has some off and on  Pressure at times  Back pain is better   Remote hx of endometriosis   And right  si area pain in past but better  Asks about pain ideas  If needed   woking to get lipids down acitivity and diet  Labs were fasting   Forgot hearing aids today  Had covid disease ( waiting on deciding to get vaccine)  Family is vaccinated  ROS: See pertinent positives and negatives per HPI.  Past Medical History:  Diagnosis Date  . Allergy   . Anal fissure   . Arthritis    hands  . Benign paroxysmal positional vertigo   . Complication of anesthesia   . Endometriosis   . Hemorrhoids   . History of skin cancer   . Hyperlipidemia   . Hyperplastic colon polyp   . Hypertension   . IBS (irritable bowel syndrome)   . Melanoma ([place]) 06/10/2016   right thumb   . Melanoma in situ ([place]) 05/31/2016  . Personal history of covid-19   . PONV (postoperative nausea and vomiting)     Family History  Problem Relation Age of Onset  . Diabetes Mother   . Stroke Mother   . Breast cancer Sister   . Colon polyps Sister   . Diabetes Sister   . Colon polyps Sister   . Colon cancer Neg Hx     Social History   Socioeconomic History  . Marital status: Married    Spouse name: Not on file  . Number of children: Not on file  . Years of education: Not on file  . Highest education level: Not on file  Occupational History  . Not on file  Tobacco Use  . Smoking status: Former Research scientist (life sciences)  . Smokeless tobacco: Never Used  Vaping Use  . Vaping Use: Never used  Substance and Sexual Activity  . Alcohol use: Yes    Alcohol/week: 0.0 standard drinks    Comment: rare  . Drug use: No  . Sexual  activity: Not on file  Other Topics Concern  . Not on file  Social History Narrative   Widowed   Remarried 2015 2016   [employer] [facility] of 2   Dog and Cat   6-7 hours of sleep   Mom passed away 12/22/09   Working [employer]  52 - 48  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   Social Determinants of Health   Financial Resource Strain:   . Difficulty of Paying Living Expenses:   Food Insecurity:   . Worried About Charity fundraiser in the Last Year:   . Arboriculturist in the Last Year:   Transportation Needs:   . Film/video editor (Medical):   Marland Kitchen Lack of Transportation (Non-Medical):   Physical Activity:   . Days of Exercise per Week:   . Minutes of Exercise per Session:   Stress:   . Feeling of Stress :  Social Connections:   . Frequency of Communication with Friends and Family:   . Frequency of Social Gatherings with Friends and Family:   . Attends Religious Services:   . Active Member of Clubs or Organizations:   . Attends Archivist Meetings:   Marland Kitchen Marital Status:     Outpatient Medications Prior to Visit  Medication Sig Dispense Refill  . aspirin 81 MG chewable tablet Chew 81 mg by mouth daily.      . Cholecalciferol (VITAMIN D3) 400 UNITS CAPS Take 1 capsule by mouth daily.    Marland Kitchen docusate sodium (COLACE) 100 MG capsule Take 100 mg by mouth 2 (two) times daily.    Marland Kitchen losartan (COZAAR) 50 MG tablet Take 1 tablet (50 mg total) by mouth daily. 90 tablet 3  . LYSINE PO Take by mouth as needed.     . MULTIPLE VITAMIN PO Take by mouth.      . Omega-3 Fatty Acids (FISH OIL) 1000 MG CAPS     . triamcinolone cream (KENALOG) 0.1 % Apply 1 application topically 2 (two) times daily.    . TURMERIC PO Take 750 mg by mouth daily.    . valACYclovir (VALTREX) 1000 MG tablet Take 2 tablets (2,000 mg total) by mouth 2 (two) times daily. As needed for cold sores 30 tablet 3  . diclofenac Sodium (VOLTAREN) 1 % GEL diclofenac  1 % topical gel  APPLY 4 GRAMS TOPICALLY TO AFFECTED AREA(S) 4 TIMES DAILY (Patient not taking: Reported on 07/07/2019)    . fluocinonide (LIDEX) 0.05 % external solution fluocinonide 0.05 % topical solution  APPLY 1 ML ON THE SKIN BID (Patient not taking: Reported on 07/07/2019)    . hydrocortisone 2.5 % cream hydrocortisone 2.5 % topical cream (Patient not taking: Reported on 07/07/2019)    . phenazopyridine (PYRIDIUM) 200 MG tablet  (Patient not taking: Reported on 07/07/2019)     No facility-administered medications prior to visit.     EXAM:  BP 130/84   Pulse 88   Temp 97.9 F (36.6 C) (Temporal)   Ht 5\' 4"  (1.626 m)   Wt 145 lb 12.8 oz (66.1 kg)   SpO2 98%   BMI 25.03 kg/m   Body mass index is 25.03 kg/m.  GENERAL: vitals reviewed and listed above, alert, oriented, appears well hydrated and in no acute distress HEENT: atraumatic, conjunctiva  clear, no obvious abnormalities on inspection of external nose and ears OP :masked  NECK: no obvious masses on inspection palpation  MS: moves all extremities without noticeable focal  Abnormality Area of ain at times  Right si and buttocks but not today   Walking normal  PSYCH: pleasant and cooperative, no obvious depression or anxiety Lab Results  Component Value Date   WBC 6.2 02/14/2019   HGB 13.5 02/14/2019   HCT 40.1 02/14/2019   PLT 240.0 02/14/2019   GLUCOSE 91 02/14/2019   CHOL 212 (H) 07/05/2019   TRIG 100.0 07/05/2019   HDL 61.60 07/05/2019   LDLDIRECT 143.0 02/14/2019   LDLCALC 131 (H) 07/05/2019   ALT 19 02/14/2019   AST 18 02/14/2019   NA 139 02/14/2019   K 4.2 02/14/2019   CL 102 02/14/2019   CREATININE 0.82 02/14/2019   BUN 11 02/14/2019   CO2 28 02/14/2019   TSH 0.90 02/03/2018   BP Readings from Last 3 Encounters:  07/07/19 130/84  02/14/19 118/64  03/25/18 132/68   Urinalysis    Component Value Date/Time   COLORURINE YELLOW 07/05/2019  [place] 07/05/2019 1034   LABSPEC 1.020  07/05/2019 1034   PHURINE 6.0 07/05/2019 [place] 07/05/2019 1034   HGBUR NEGATIVE 07/05/2019 1034   HGBUR negative 01/03/2010 0750   BILIRUBINUR NEGATIVE 07/05/2019 1034   BILIRUBINUR n 01/07/2011 1514   KETONESUR NEGATIVE 07/05/2019 1034   PROTEINUR n 01/07/2011 1514   UROBILINOGEN 0.2 07/05/2019 1034   NITRITE NEGATIVE 07/05/2019 1034   LEUKOCYTESUR NEGATIVE 07/05/2019 1034    u cx low count 10 000 gm + prob externa lcont bacteria  ASSESSMENT AND PLAN:  Discussed the following assessment and plan:  Lower urinary tract symptoms (LUTS) - better  uti  seems gone consider disc dec baldder irritants and  fu if any recurrent sx   Elevated triglycerides with high cholesterol - much improved fasting levels and lsi also continue  down fro 270 2 years ago  ua and cx sounds resolved  Lipids are improved   Past hx of covid disc unknown about immunity and when and if to get vaccine 1 or 2 but consider it because of age  Or future direction -Patient advised to return or notify health care team  if  new concerns arise.  Patient Instructions  Urine is clear today .  Cholesterol much better     Keep up the good work .  cpx in January or as needed     Jhovani Griswold K. Mickel Schreur [name]

## 2019-07-07 NOTE — Patient Instructions (Signed)
Urine is clear today .  Cholesterol much better     Keep up the good work .  cpx in January or as needed

## 2019-07-10 DIAGNOSIS — Z6824 Body mass index (BMI) 24.0-24.9, adult: Secondary | ICD-10-CM | POA: Diagnosis not present

## 2019-07-10 DIAGNOSIS — I10 Essential (primary) hypertension: Secondary | ICD-10-CM | POA: Diagnosis not present

## 2019-07-10 DIAGNOSIS — H269 Unspecified cataract: Secondary | ICD-10-CM | POA: Diagnosis not present

## 2019-07-10 DIAGNOSIS — Z87891 Personal history of nicotine dependence: Secondary | ICD-10-CM | POA: Diagnosis not present

## 2019-07-10 DIAGNOSIS — Z89011 Acquired absence of right thumb: Secondary | ICD-10-CM | POA: Diagnosis not present

## 2019-07-10 DIAGNOSIS — K5900 Constipation, unspecified: Secondary | ICD-10-CM | POA: Diagnosis not present

## 2019-07-10 DIAGNOSIS — Z85820 Personal history of malignant melanoma of skin: Secondary | ICD-10-CM | POA: Diagnosis not present

## 2019-07-10 DIAGNOSIS — I739 Peripheral vascular disease, unspecified: Secondary | ICD-10-CM | POA: Diagnosis not present

## 2019-07-10 DIAGNOSIS — H9190 Unspecified hearing loss, unspecified ear: Secondary | ICD-10-CM | POA: Diagnosis not present

## 2019-07-11 ENCOUNTER — Encounter: Payer: Self-pay | Admitting: Internal Medicine

## 2019-07-11 DIAGNOSIS — Z1231 Encounter for screening mammogram for malignant neoplasm of breast: Secondary | ICD-10-CM | POA: Diagnosis not present

## 2019-07-11 DIAGNOSIS — M8589 Other specified disorders of bone density and structure, multiple sites: Secondary | ICD-10-CM | POA: Diagnosis not present

## 2019-07-11 LAB — HM MAMMOGRAPHY

## 2019-07-11 LAB — HM DEXA SCAN

## 2019-07-24 DIAGNOSIS — Z01419 Encounter for gynecological examination (general) (routine) without abnormal findings: Secondary | ICD-10-CM | POA: Diagnosis not present

## 2019-07-24 DIAGNOSIS — Z6824 Body mass index (BMI) 24.0-24.9, adult: Secondary | ICD-10-CM | POA: Diagnosis not present

## 2019-09-05 DIAGNOSIS — D485 Neoplasm of uncertain behavior of skin: Secondary | ICD-10-CM | POA: Diagnosis not present

## 2019-09-05 DIAGNOSIS — Z85828 Personal history of other malignant neoplasm of skin: Secondary | ICD-10-CM | POA: Diagnosis not present

## 2019-09-05 DIAGNOSIS — L218 Other seborrheic dermatitis: Secondary | ICD-10-CM | POA: Diagnosis not present

## 2019-09-05 DIAGNOSIS — Z85820 Personal history of malignant melanoma of skin: Secondary | ICD-10-CM | POA: Diagnosis not present

## 2019-09-05 DIAGNOSIS — L570 Actinic keratosis: Secondary | ICD-10-CM | POA: Diagnosis not present

## 2019-09-05 DIAGNOSIS — L905 Scar conditions and fibrosis of skin: Secondary | ICD-10-CM | POA: Diagnosis not present

## 2019-09-05 DIAGNOSIS — L910 Hypertrophic scar: Secondary | ICD-10-CM | POA: Diagnosis not present

## 2019-09-05 DIAGNOSIS — L821 Other seborrheic keratosis: Secondary | ICD-10-CM | POA: Diagnosis not present

## 2019-10-17 DIAGNOSIS — H40013 Open angle with borderline findings, low risk, bilateral: Secondary | ICD-10-CM | POA: Diagnosis not present

## 2019-10-17 DIAGNOSIS — H524 Presbyopia: Secondary | ICD-10-CM | POA: Diagnosis not present

## 2019-11-07 DIAGNOSIS — L905 Scar conditions and fibrosis of skin: Secondary | ICD-10-CM | POA: Diagnosis not present

## 2019-11-07 DIAGNOSIS — L570 Actinic keratosis: Secondary | ICD-10-CM | POA: Diagnosis not present

## 2019-11-07 DIAGNOSIS — L821 Other seborrheic keratosis: Secondary | ICD-10-CM | POA: Diagnosis not present

## 2019-11-29 DIAGNOSIS — M170 Bilateral primary osteoarthritis of knee: Secondary | ICD-10-CM | POA: Diagnosis not present

## 2019-11-29 DIAGNOSIS — M25561 Pain in right knee: Secondary | ICD-10-CM | POA: Diagnosis not present

## 2019-12-02 ENCOUNTER — Other Ambulatory Visit: Payer: Self-pay | Admitting: Internal Medicine

## 2020-01-08 DIAGNOSIS — L820 Inflamed seborrheic keratosis: Secondary | ICD-10-CM | POA: Diagnosis not present

## 2020-01-10 DIAGNOSIS — M170 Bilateral primary osteoarthritis of knee: Secondary | ICD-10-CM | POA: Diagnosis not present

## 2020-01-15 DIAGNOSIS — Z20822 Contact with and (suspected) exposure to covid-19: Secondary | ICD-10-CM | POA: Diagnosis not present

## 2020-02-05 NOTE — Progress Notes (Signed)
Chief Complaint  Patient presents with  . Annual Exam    HPI: Sheila Livingston 75 y.o. comes in today for Preventive Medicare exam/ wellness visit .Since last visit. Doing well  Bp good  On meds  Sees derm Every 6 months derm . Some med knee pain had injection Has tmc creams one for perineal area one for ? Hands or other  0.1 and 0.025  And 2.5 % hcs  Asks for  Smaller pill for cold sores  Hard to swallow  Large ones  Health Maintenance  Topic Date Due  . COVID-19 Vaccine (1) 02/22/2020 (Originally 05/26/1957)  . TETANUS/TDAP  02/05/2021 (Originally 01/27/2015)  . INFLUENZA VACCINE  Completed  . DEXA SCAN  Completed  . Hepatitis C Screening  Completed  . PNA vac Low Risk Adult  Completed   Health Maintenance Review LIFESTYLE:  Exercise:   Trying 3 d per week stationary bike in between  Dance  Tobacco/ETS:n Alcohol: n Sugar beverages:  Not really  Sleep: 7  Drug use: no HH:  2  No pets     Hearing:  Aids   Vision:  No limitations at present . Last eye check UTD  Safety:  Has smoke detector and wears seat belts.  . No excess sun exposure. Sees dentist regularly.  Falls: n  Memory: Felt to be good  , no concern from her or her family.  Depression: No anhedonia unusual crying or depressive symptoms  Nutrition: Eats well balanced diet; adequate calcium and vitamin D. No swallowing chewing problems.  Injury: no major injuries in the last six months.  Other healthcare providers:  Reviewed today .  Preventive parameters: up-to-date  Reviewed   ADLS:   There are no problems or need for assistance  driving, feeding, obtaining food, dressing, toileting and bathing, managing money using phone. She is independent.    ROS:  GEN/ HEENT: No fever, significant weight changes sweats headaches vision problems hearing changes, CV/ PULM; No chest pain shortness of breath cough, syncope,edema  change in exercise tolerance. GI /GU: No adominal pain, vomiting, change in bowel habits.  No blood in the stool. No significant GU symptoms. SKIN/HEME: ,no acute skin rashes suspicious lesions or bleeding. No lymphadenopathy, nodules, masses.  NEURO/ PSYCH:  No neurologic signs such as weakness numbness. No depression anxiety. IMM/ Allergy: No unusual infections.  Allergy .   REST of 12 system review negative except as per HPI   Past Medical History:  Diagnosis Date  . Allergy   . Anal fissure   . Arthritis    hands  . Benign paroxysmal positional vertigo   . Complication of anesthesia   . Endometriosis   . Hemorrhoids   . History of skin cancer   . Hyperlipidemia   . Hyperplastic colon polyp   . Hypertension   . IBS (irritable bowel syndrome)   . Melanoma ([place]) 06/10/2016   right thumb   . Melanoma in situ ([place]) 05/31/2016  . Personal history of COVID-19   . PONV (postoperative nausea and vomiting)     Family History  Problem Relation Age of Onset  . Diabetes Mother   . Stroke Mother   . Breast cancer Sister   . Colon polyps Sister   . Diabetes Sister   . Colon polyps Sister   . Colon cancer Neg Hx     Social History   Socioeconomic History  . Marital status: Married    Spouse name: Not on file  . Number  of children: Not on file  . Years of education: Not on file  . Highest education level: Not on file  Occupational History  . Not on file  Tobacco Use  . Smoking status: Former Research scientist (life sciences)  . Smokeless tobacco: Never Used  Vaping Use  . Vaping Use: Never used  Substance and Sexual Activity  . Alcohol use: Yes    Alcohol/week: 0.0 standard drinks    Comment: rare  . Drug use: No  . Sexual activity: Not on file  Other Topics Concern  . Not on file  Social History Narrative   Widowed   Remarried 2015 2016   [facility] of 2   Dog and Cat   6-7 hours of sleep   Mom passed away 12/27/2009   Working [employer]  59 - 37  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   Social Determinants of Health    Financial Resource Strain: Not on file  Food Insecurity: Not on file  Transportation Needs: Not on file  Physical Activity: Not on file  Stress: Not on file  Social Connections: Not on file    Outpatient Encounter Medications as of 02/06/2020  Medication Sig  . aspirin 81 MG chewable tablet Chew 81 mg by mouth daily.  . Cholecalciferol (VITAMIN D3) 400 UNITS CAPS Take 1 capsule by mouth daily.  Marland Kitchen docusate sodium (COLACE) 100 MG capsule Take 100 mg by mouth 2 (two) times daily.  . fluocinonide (LIDEX) 0.05 % external solution fluocinonide 0.05 % topical solution  APPLY 1 ML ON THE SKIN BID  . hydrocortisone 2.5 % cream hydrocortisone 2.5 % topical cream  . losartan (COZAAR) 50 MG tablet TAKE 1 TABLET EVERY DAY  . LYSINE PO Take by mouth as needed.   . MULTIPLE VITAMIN PO Take by mouth.  . Omega-3 Fatty Acids (FISH OIL) 1000 MG CAPS   . triamcinolone cream (KENALOG) 0.1 % Apply 1 application topically 2 (two) times daily.  . TURMERIC PO Take 750 mg by mouth daily.  . valACYclovir (VALTREX) 500 MG tablet Take 4 tablets (2,000 mg total) by mouth 2 (two) times daily. Prn cold sores  . [DISCONTINUED] valACYclovir (VALTREX) 1000 MG tablet Take 2 tablets (2,000 mg total) by mouth 2 (two) times daily. As needed for cold sores  . [DISCONTINUED] diclofenac Sodium (VOLTAREN) 1 % GEL diclofenac 1 % topical gel  APPLY 4 GRAMS TOPICALLY TO AFFECTED AREA(S) 4 TIMES DAILY (Patient not taking: Reported on 07/07/2019)  . [DISCONTINUED] phenazopyridine (PYRIDIUM) 200 MG tablet  (Patient not taking: Reported on 07/07/2019)   No facility-administered encounter medications on file as of 02/06/2020.    EXAM:  BP 122/64 (BP Location: Left Arm, Patient Position: Sitting, Cuff Size: Normal)   Pulse 67   Temp 97.8 F (36.6 C) (Oral)   Ht 5' 4.25" (1.632 m)   Wt 139 lb 6.4 oz (63.2 kg)   SpO2 98%   BMI 23.74 kg/m   Body mass index is 23.74 kg/m.  Physical Exam: Vital signs reviewed YQM:VHQI is a  well-developed well-nourished alert cooperative   who appears stated age in no acute distress.  HEENT: normocephalic atraumatic , Eyes: PERRL EOM's full, conjunctiva clear, Nares: paten,t no deformity discharge or tenderness., Ears: no deformity EAC's clear TMs with normal landmarks. Mouthmasked  NECK: supple without masses, thyromegaly or bruits. CHEST/PULM:  Clear to auscultation and percussion breath sounds equal no wheeze ,  rales or rhonchi. No chest Hassing deformities or tenderness.Breast: normal by inspection . No dimpling, discharge, masses, tenderness or discharge . CV: PMI is nondisplaced, S1 S2 no gallops, murmurs, rubs. Peripheral pulses are full without delay.No JVD .  ABDOMEN: Bowel sounds normal nontender  No guard or rebound, no hepato splenomegal no CVA tenderness.   Extremtities:  No clubbing cyanosis or edema, no acute joint swelling or redness no focal atrophy missing distal finger ( sp melanoma amputation)  NEURO:  Oriented x3, cranial nerves 3-12 appear to be intact, no obvious focal weakness,gait within normal limits no abnormal reflexes or asymmetrical SKIN: No acute rashes normal turgor, color, no bruising or petechiae. PSYCH: Oriented, good eye contact, no obvious depression anxiety, cognition and judgment appear normal. LN: no cervical axillary inguinal adenopathy No noted deficits in memory, attention, and speech.   Lab Results  Component Value Date   WBC 6.2 02/14/2019   HGB 13.5 02/14/2019   HCT 40.1 02/14/2019   PLT 240.0 02/14/2019   GLUCOSE 91 02/14/2019   CHOL 212 (H) 07/05/2019   TRIG 100.0 07/05/2019   HDL 61.60 07/05/2019   LDLDIRECT 143.0 02/14/2019   LDLCALC 131 (H) 07/05/2019   ALT 19 02/14/2019   AST 18 02/14/2019   NA 139 02/14/2019   K 4.2 02/14/2019   CL 102 02/14/2019   CREATININE 0.82 02/14/2019   BUN 11 02/14/2019   CO2 28 02/14/2019   TSH 0.90 02/03/2018    ASSESSMENT AND PLAN:  Discussed the following assessment and plan:  Visit  for preventive health examination  Essential hypertension - Plan: Basic metabolic panel, CBC with Differential/Platelet, Hepatic function panel, Lipid panel, TSH, Basic metabolic panel, CBC with Differential/Platelet, Hepatic function panel, TSH, Lipid panel  Medication management - Plan: Basic metabolic panel, CBC with Differential/Platelet, Hepatic function panel, Lipid panel, TSH, Basic metabolic panel, CBC with Differential/Platelet, Hepatic function panel, TSH, Lipid panel  Hyperlipidemia, unspecified hyperlipidemia type  History of 2019 novel coronavirus disease (COVID-19)  Elevated triglycerides with high cholesterol - Plan: Basic metabolic panel, CBC with Differential/Platelet, Hepatic function panel, Lipid panel, TSH, Basic metabolic panel, CBC with Differential/Platelet, Hepatic function panel, TSH, Lipid panel  History of melanoma in situ  Recurrent herpes labialis Continue lifestyle intervention healthy eating and exercise . Monitoring labs today  Disc about  [place] of  Info about  Infection immunity  And  Vaccine but  May want to do one as a "booster  " BP controlled  creamps discussed use  maywant to just use the .021 tmc  First .  Patient Care Team: Kimari Lienhard, Standley Brooking, MD as PCP - General (Internal Medicine) Sydnee Cabal, MD (Orthopedic Surgery) amy Boatman  Linda Hedges, DO as Consulting Physician (Obstetrics and Gynecology) Daryll Brod, MD as Consulting Physician (Orthopedic Surgery) Johnathan Hausen, MD as Consulting Physician (General Surgery) Ayesha Mohair (Dermatology)  Patient Instructions   Glad you are doing well. consider  covid vaccine as a booster  Since its been over a year since you have infection.   Continue lifestyle intervention healthy eating and exercise .   Will notify you  of labs when available.   Health Maintenance, Female Adopting a healthy lifestyle and getting preventive care are important in promoting health and wellness. Ask your  health care provider about:  The right schedule for you to have regular tests and exams.  Things you can do on your own to prevent diseases and keep yourself healthy. What should I know about diet, weight, and  exercise? Eat a healthy diet  Eat a diet that includes plenty of vegetables, fruits, low-fat dairy products, and lean protein.  Do not eat a lot of foods that are high in solid fats, added sugars, or sodium.   Maintain a healthy weight Body mass index (BMI) is used to identify weight problems. It estimates body fat based on height and weight. Your health care provider can help determine your BMI and help you achieve or maintain a healthy weight. Get regular exercise Get regular exercise. This is one of the most important things you can do for your health. Most adults should:  Exercise for at least 150 minutes each week. The exercise should increase your heart rate and make you sweat (moderate-intensity exercise).  Do strengthening exercises at least twice a week. This is in addition to the moderate-intensity exercise.  Spend less time sitting. Even light physical activity can be beneficial. Watch cholesterol and blood lipids Have your blood tested for lipids and cholesterol at 77 years of age, then have this test every 5 years. Have your cholesterol levels checked more often if:  Your lipid or cholesterol levels are high.  You are older than 77 years of age.  You are at high risk for heart disease. What should I know about cancer screening? Depending on your health history and family history, you may need to have cancer screening at various ages. This may include screening for:  Breast cancer.  Cervical cancer.  Colorectal cancer.  Skin cancer.  Lung cancer. What should I know about heart disease, diabetes, and high blood pressure? Blood pressure and heart disease  High blood pressure causes heart disease and increases the risk of stroke. This is more likely to  develop in people who have high blood pressure readings, are of African descent, or are overweight.  Have your blood pressure checked: ? Every 3-5 years if you are 62-93 years of age. ? Every year if you are 47 years old or older. Diabetes Have regular diabetes screenings. This checks your fasting blood sugar level. Have the screening done:  Once every three years after age 45 if you are at a normal weight and have a low risk for diabetes.  More often and at a younger age if you are overweight or have a high risk for diabetes. What should I know about preventing infection? Hepatitis B If you have a higher risk for hepatitis B, you should be screened for this virus. Talk with your health care provider to find out if you are at risk for hepatitis B infection. Hepatitis C Testing is recommended for:  Everyone born from [birth year] through 1965.  Anyone with known risk factors for hepatitis C. Sexually transmitted infections (STIs)  Get screened for STIs, including gonorrhea and chlamydia, if: ? You are sexually active and are younger than 77 years of age. ? You are older than 77 years of age and your health care provider tells you that you are at risk for this type of infection. ? Your sexual activity has changed since you were last screened, and you are at increased risk for chlamydia or gonorrhea. Ask your health care provider if you are at risk.  Ask your health care provider about whether you are at high risk for HIV. Your health care provider may recommend a prescription medicine to help prevent HIV infection. If you choose to take medicine to prevent HIV, you should first get tested for HIV. You should then be tested every 3 months for  as long as you are taking the medicine. Pregnancy  If you are about to stop having your period (premenopausal) and you may become pregnant, seek counseling before you get pregnant.  Take 400 to 800 micrograms (mcg) of folic acid every day if you become  pregnant.  Ask for birth control (contraception) if you want to prevent pregnancy. Osteoporosis and menopause Osteoporosis is a disease in which the bones lose minerals and strength with aging. This can result in bone fractures. If you are 59 years old or older, or if you are at risk for osteoporosis and fractures, ask your health care provider if you should:  Be screened for bone loss.  Take a calcium or vitamin D supplement to lower your risk of fractures.  Be given hormone replacement therapy (HRT) to treat symptoms of menopause. Follow these instructions at home: Lifestyle  Do not use any products that contain nicotine or tobacco, such as cigarettes, e-cigarettes, and chewing tobacco. If you need help quitting, ask your health care provider.  Do not use street drugs.  Do not share needles.  Ask your health care provider for help if you need support or information about quitting drugs. Alcohol use  Do not drink alcohol if: ? Your health care provider tells you not to drink. ? You are pregnant, may be pregnant, or are planning to become pregnant.  If you drink alcohol: ? Limit how much you use to 0-1 drink a day. ? Limit intake if you are breastfeeding.  Be aware of how much alcohol is in your drink. In the [place] one drink equals one 12 oz bottle of beer (355 mL), one 5 oz glass of wine (148 mL), or one 1 oz glass of hard liquor (44 mL). General instructions  Schedule regular health, dental, and eye exams.  Stay current with your vaccines.  Tell your health care provider if: ? You often feel depressed. ? You have ever been abused or do not feel safe at home. Summary  Adopting a healthy lifestyle and getting preventive care are important in promoting health and wellness.  Follow your health care provider's instructions about healthy diet, exercising, and getting tested or screened for diseases.  Follow your health care provider's instructions on monitoring your  cholesterol and blood pressure. This information is not intended to replace advice given to you by your health care provider. Make sure you discuss any questions you have with your health care provider. Document Revised: 01/05/2018 Document Reviewed: 01/05/2018 Elsevier Patient Education  2021 [employer].    [place]. Lynx Goodrich [name]

## 2020-02-06 ENCOUNTER — Encounter: Payer: Self-pay | Admitting: Internal Medicine

## 2020-02-06 ENCOUNTER — Ambulatory Visit (INDEPENDENT_AMBULATORY_CARE_PROVIDER_SITE_OTHER): Payer: Medicare HMO | Admitting: Internal Medicine

## 2020-02-06 ENCOUNTER — Other Ambulatory Visit: Payer: Self-pay

## 2020-02-06 VITALS — BP 122/64 | HR 67 | Temp 97.8°F | Ht 64.25 in | Wt 139.4 lb

## 2020-02-06 DIAGNOSIS — E782 Mixed hyperlipidemia: Secondary | ICD-10-CM | POA: Diagnosis not present

## 2020-02-06 DIAGNOSIS — Z8616 Personal history of COVID-19: Secondary | ICD-10-CM | POA: Diagnosis not present

## 2020-02-06 DIAGNOSIS — B001 Herpesviral vesicular dermatitis: Secondary | ICD-10-CM | POA: Diagnosis not present

## 2020-02-06 DIAGNOSIS — Z0000 Encounter for general adult medical examination without abnormal findings: Secondary | ICD-10-CM

## 2020-02-06 DIAGNOSIS — Z86006 Personal history of melanoma in-situ: Secondary | ICD-10-CM | POA: Diagnosis not present

## 2020-02-06 DIAGNOSIS — I10 Essential (primary) hypertension: Secondary | ICD-10-CM | POA: Diagnosis not present

## 2020-02-06 DIAGNOSIS — Z79899 Other long term (current) drug therapy: Secondary | ICD-10-CM | POA: Diagnosis not present

## 2020-02-06 DIAGNOSIS — E785 Hyperlipidemia, unspecified: Secondary | ICD-10-CM

## 2020-02-06 MED ORDER — VALACYCLOVIR HCL 500 MG PO TABS: 2000.0000 mg | ORAL_TABLET | Freq: Two times a day (BID) | ORAL | 2 refills | Status: AC

## 2020-02-06 NOTE — Addendum Note (Signed)
Addended by: Tessie Fass D on: 02/06/2020 04:46 PM   Modules accepted: Orders

## 2020-02-06 NOTE — Patient Instructions (Signed)
Glad you are doing well. consider  covid vaccine as a booster  Since its been over a year since you have infection.   Continue lifestyle intervention healthy eating and exercise .   Will notify you  of labs when available.   Health Maintenance, Female Adopting a healthy lifestyle and getting preventive care are important in promoting health and wellness. Ask your health care provider about:  The right schedule for you to have regular tests and exams.  Things you can do on your own to prevent diseases and keep yourself healthy. What should I know about diet, weight, and exercise? Eat a healthy diet  Eat a diet that includes plenty of vegetables, fruits, low-fat dairy products, and lean protein.  Do not eat a lot of foods that are high in solid fats, added sugars, or sodium.   Maintain a healthy weight Body mass index (BMI) is used to identify weight problems. It estimates body fat based on height and weight. Your health care provider can help determine your BMI and help you achieve or maintain a healthy weight. Get regular exercise Get regular exercise. This is one of the most important things you can do for your health. Most adults should:  Exercise for at least 150 minutes each week. The exercise should increase your heart rate and make you sweat (moderate-intensity exercise).  Do strengthening exercises at least twice a week. This is in addition to the moderate-intensity exercise.  Spend less time sitting. Even light physical activity can be beneficial. Watch cholesterol and blood lipids Have your blood tested for lipids and cholesterol at 77 years of age, then have this test every 5 years. Have your cholesterol levels checked more often if:  Your lipid or cholesterol levels are high.  You are older than 77 years of age.  You are at high risk for heart disease. What should I know about cancer screening? Depending on your health history and family history, you may need to  have cancer screening at various ages. This may include screening for:  Breast cancer.  Cervical cancer.  Colorectal cancer.  Skin cancer.  Lung cancer. What should I know about heart disease, diabetes, and high blood pressure? Blood pressure and heart disease  High blood pressure causes heart disease and increases the risk of stroke. This is more likely to develop in people who have high blood pressure readings, are of African descent, or are overweight.  Have your blood pressure checked: ? Every 3-5 years if you are 59-48 years of age. ? Every year if you are 67 years old or older. Diabetes Have regular diabetes screenings. This checks your fasting blood sugar level. Have the screening done:  Once every three years after age 69 if you are at a normal weight and have a low risk for diabetes.  More often and at a younger age if you are overweight or have a high risk for diabetes. What should I know about preventing infection? Hepatitis B If you have a higher risk for hepatitis B, you should be screened for this virus. Talk with your health care provider to find out if you are at risk for hepatitis B infection. Hepatitis C Testing is recommended for:  Everyone born from [birth year] through 1965.  Anyone with known risk factors for hepatitis C. Sexually transmitted infections (STIs)  Get screened for STIs, including gonorrhea and chlamydia, if: ? You are sexually active and are younger than 77 years of age. ? You are older than 77 years  of age and your health care provider tells you that you are at risk for this type of infection. ? Your sexual activity has changed since you were last screened, and you are at increased risk for chlamydia or gonorrhea. Ask your health care provider if you are at risk.  Ask your health care provider about whether you are at high risk for HIV. Your health care provider may recommend a prescription medicine to help prevent HIV infection. If you choose to  take medicine to prevent HIV, you should first get tested for HIV. You should then be tested every 3 months for as long as you are taking the medicine. Pregnancy  If you are about to stop having your period (premenopausal) and you may become pregnant, seek counseling before you get pregnant.  Take 400 to 800 micrograms (mcg) of folic acid every day if you become pregnant.  Ask for birth control (contraception) if you want to prevent pregnancy. Osteoporosis and menopause Osteoporosis is a disease in which the bones lose minerals and strength with aging. This can result in bone fractures. If you are 23 years old or older, or if you are at risk for osteoporosis and fractures, ask your health care provider if you should:  Be screened for bone loss.  Take a calcium or vitamin D supplement to lower your risk of fractures.  Be given hormone replacement therapy (HRT) to treat symptoms of menopause. Follow these instructions at home: Lifestyle  Do not use any products that contain nicotine or tobacco, such as cigarettes, e-cigarettes, and chewing tobacco. If you need help quitting, ask your health care provider.  Do not use street drugs.  Do not share needles.  Ask your health care provider for help if you need support or information about quitting drugs. Alcohol use  Do not drink alcohol if: ? Your health care provider tells you not to drink. ? You are pregnant, may be pregnant, or are planning to become pregnant.  If you drink alcohol: ? Limit how much you use to 0-1 drink a day. ? Limit intake if you are breastfeeding.  Be aware of how much alcohol is in your drink. In the [place] one drink equals one 12 oz bottle of beer (355 mL), one 5 oz glass of wine (148 mL), or one 1 oz glass of hard liquor (44 mL). General instructions  Schedule regular health, dental, and eye exams.  Stay current with your vaccines.  Tell your health care provider if: ? You often feel depressed. ? You  have ever been abused or do not feel safe at home. Summary  Adopting a healthy lifestyle and getting preventive care are important in promoting health and wellness.  Follow your health care provider's instructions about healthy diet, exercising, and getting tested or screened for diseases.  Follow your health care provider's instructions on monitoring your cholesterol and blood pressure. This information is not intended to replace advice given to you by your health care provider. Make sure you discuss any questions you have with your health care provider. Document Revised: 01/05/2018 Document Reviewed: 01/05/2018 Elsevier Patient Education  2021 [employer].

## 2020-02-08 ENCOUNTER — Other Ambulatory Visit: Payer: Self-pay

## 2020-02-08 ENCOUNTER — Other Ambulatory Visit (INDEPENDENT_AMBULATORY_CARE_PROVIDER_SITE_OTHER): Payer: Medicare HMO

## 2020-02-08 DIAGNOSIS — E785 Hyperlipidemia, unspecified: Secondary | ICD-10-CM

## 2020-02-08 DIAGNOSIS — Z0000 Encounter for general adult medical examination without abnormal findings: Secondary | ICD-10-CM

## 2020-02-08 DIAGNOSIS — I10 Essential (primary) hypertension: Secondary | ICD-10-CM

## 2020-02-08 DIAGNOSIS — Z79899 Other long term (current) drug therapy: Secondary | ICD-10-CM

## 2020-02-08 LAB — CBC WITH DIFFERENTIAL/PLATELET
Basophils Absolute: 0 10*3/uL (ref 0.0–0.1)
Basophils Relative: 0.3 % (ref 0.0–3.0)
Eosinophils Absolute: 0.2 10*3/uL (ref 0.0–0.7)
Eosinophils Relative: 3.8 % (ref 0.0–5.0)
HCT: 36.6 % (ref 36.0–46.0)
Hemoglobin: 12.6 g/dL (ref 12.0–15.0)
Lymphocytes Relative: 44 % (ref 12.0–46.0)
Lymphs Abs: 2.6 10*3/uL (ref 0.7–4.0)
MCHC: 34.5 g/dL (ref 30.0–36.0)
MCV: 91.7 fl (ref 78.0–100.0)
Monocytes Absolute: 0.5 10*3/uL (ref 0.1–1.0)
Monocytes Relative: 8.4 % (ref 3.0–12.0)
Neutro Abs: 2.6 10*3/uL (ref 1.4–7.7)
Neutrophils Relative %: 43.5 % (ref 43.0–77.0)
Platelets: 220 10*3/uL (ref 150.0–400.0)
RBC: 3.99 Mil/uL (ref 3.87–5.11)
RDW: 13.2 % (ref 11.5–15.5)
WBC: 6 10*3/uL (ref 4.0–10.5)

## 2020-02-08 LAB — BASIC METABOLIC PANEL
BUN: 12 mg/dL (ref 6–23)
CO2: 27 mEq/L (ref 19–32)
Calcium: 9.5 mg/dL (ref 8.4–10.5)
Chloride: 106 mEq/L (ref 96–112)
Creatinine, Ser: 0.81 mg/dL (ref 0.40–1.20)
GFR: 70.37 mL/min (ref >60.00)
Glucose, Bld: 98 mg/dL (ref 70–99)
Potassium: 3.8 mEq/L (ref 3.5–5.1)
Sodium: 139 mEq/L (ref 135–145)

## 2020-02-08 LAB — HEPATIC FUNCTION PANEL
ALT: 15 U/L (ref 0–35)
AST: 19 U/L (ref 0–37)
Albumin: 4.3 g/dL (ref 3.5–5.2)
Alkaline Phosphatase: 63 U/L (ref 39–117)
Bilirubin, Direct: 0.1 mg/dL (ref 0.0–0.3)
Total Bilirubin: 0.5 mg/dL (ref 0.2–1.2)
Total Protein: 6.8 g/dL (ref 6.0–8.3)

## 2020-02-08 LAB — LIPID PANEL
Cholesterol: 246 mg/dL — ABNORMAL HIGH (ref 0–200)
HDL: 76.5 mg/dL (ref >39.00)
LDL Cholesterol: 144 mg/dL — ABNORMAL HIGH (ref 0–99)
NonHDL: 169.23
Total CHOL/HDL Ratio: 3
Triglycerides: 125 mg/dL (ref 0.0–149.0)
VLDL: 25 mg/dL (ref 0.0–40.0)

## 2020-02-08 LAB — TSH: TSH: 2.57 u[IU]/mL (ref 0.35–4.50)

## 2020-02-09 NOTE — Progress Notes (Signed)
Resutls are normal except the cholesterol back up again  Intensify lifestyle interventions.   Like before to  lower the cholesterol level .

## 2020-02-13 NOTE — Progress Notes (Signed)
Mychart message sent: Resutls are normal except the cholesterol back up again  Intensify lifestyle interventions.   Like before to  lower the cholesterol level .

## 2020-02-14 DIAGNOSIS — Z0100 Encounter for examination of eyes and vision without abnormal findings: Secondary | ICD-10-CM | POA: Diagnosis not present

## 2020-03-07 DIAGNOSIS — Z85820 Personal history of malignant melanoma of skin: Secondary | ICD-10-CM | POA: Diagnosis not present

## 2020-03-07 DIAGNOSIS — Z85828 Personal history of other malignant neoplasm of skin: Secondary | ICD-10-CM | POA: Diagnosis not present

## 2020-03-07 DIAGNOSIS — D225 Melanocytic nevi of trunk: Secondary | ICD-10-CM | POA: Diagnosis not present

## 2020-03-07 DIAGNOSIS — L905 Scar conditions and fibrosis of skin: Secondary | ICD-10-CM | POA: Diagnosis not present

## 2020-03-07 DIAGNOSIS — D485 Neoplasm of uncertain behavior of skin: Secondary | ICD-10-CM | POA: Diagnosis not present

## 2020-03-07 DIAGNOSIS — L910 Hypertrophic scar: Secondary | ICD-10-CM | POA: Diagnosis not present

## 2020-03-07 DIAGNOSIS — L821 Other seborrheic keratosis: Secondary | ICD-10-CM | POA: Diagnosis not present

## 2020-03-07 DIAGNOSIS — L814 Other melanin hyperpigmentation: Secondary | ICD-10-CM | POA: Diagnosis not present

## 2020-06-25 ENCOUNTER — Ambulatory Visit (INDEPENDENT_AMBULATORY_CARE_PROVIDER_SITE_OTHER): Payer: Medicare HMO

## 2020-06-25 ENCOUNTER — Other Ambulatory Visit: Payer: Self-pay

## 2020-06-25 VITALS — BP 118/70 | HR 79 | Temp 98.3°F | Wt 141.9 lb

## 2020-06-25 DIAGNOSIS — Z0000 Encounter for general adult medical examination without abnormal findings: Secondary | ICD-10-CM

## 2020-06-25 NOTE — Patient Instructions (Addendum)
Sheila Livingston , Thank you for taking time to come for your Medicare Wellness Visit. I appreciate your ongoing commitment to your health goals. Please review the following plan we discussed and let me know if I can assist you in the future.   Screening recommendations/referrals: Colonoscopy: Done 08/10/12 Mammogram: Done 07/11/19 Bone Density: Done 07/11/19 Recommended yearly ophthalmology/optometry visit for glaucoma screening and checkup Recommended yearly dental visit for hygiene and checkup  Vaccinations: Influenza vaccine: Up to date Pneumococcal vaccine: Up to date Tdap vaccine: Due and discussed Shingles vaccine: Shingrix discussed. Please contact your pharmacy for coverage information.    Covid-19:Declined and discussed  Advanced directives: Please bring a copy of your health care power of attorney and living will to the office at your convenience.  Conditions/risks identified: Maintain weight and stay active   Next appointment: Follow up in one year for your annual wellness visit    Preventive Care 65 Years and Older, Female Preventive care refers to lifestyle choices and visits with your health care provider that can promote health and wellness. What does preventive care include?  A yearly physical exam. This is also called an annual well check.  Dental exams once or twice a year.  Routine eye exams. Ask your health care provider how often you should have your eyes checked.  Personal lifestyle choices, including:  Daily care of your teeth and gums.  Regular physical activity.  Eating a healthy diet.  Avoiding tobacco and drug use.  Limiting alcohol use.  Practicing safe sex.  Taking low-dose aspirin every day.  Taking vitamin and mineral supplements as recommended by your health care provider. What happens during an annual well check? The services and screenings done by your health care provider during your annual well check will depend on your age, overall  health, lifestyle risk factors, and family history of disease. Counseling  Your health care provider may ask you questions about your:  Alcohol use.  Tobacco use.  Drug use.  Emotional well-being.  Home and relationship well-being.  Sexual activity.  Eating habits.  History of falls.  Memory and ability to understand (cognition).  Work and work Statistician.  Reproductive health. Screening  You may have the following tests or measurements:  Height, weight, and BMI.  Blood pressure.  Lipid and cholesterol levels. These may be checked every 5 years, or more frequently if you are over 89 years old.  Skin check.  Lung cancer screening. You may have this screening every year starting at age 89 if you have a 30-pack-year history of smoking and currently smoke or have quit within the past 15 years.  Fecal occult blood test (FOBT) of the stool. You may have this test every year starting at age 81.  Flexible sigmoidoscopy or colonoscopy. You may have a sigmoidoscopy every 5 years or a colonoscopy every 10 years starting at age 6.  Hepatitis C blood test.  Hepatitis B blood test.  Sexually transmitted disease (STD) testing.  Diabetes screening. This is done by checking your blood sugar (glucose) after you have not eaten for a while (fasting). You may have this done every 1-3 years.  Bone density scan. This is done to screen for osteoporosis. You may have this done starting at age 15.  Mammogram. This may be done every 1-2 years. Talk to your health care provider about how often you should have regular mammograms. Talk with your health care provider about your test results, treatment options, and if necessary, the need for more tests.  Vaccines  Your health care provider may recommend certain vaccines, such as:  Influenza vaccine. This is recommended every year.  Tetanus, diphtheria, and acellular pertussis (Tdap, Td) vaccine. You may need a Td booster every 10  years.  Zoster vaccine. You may need this after age 11.  Pneumococcal 13-valent conjugate (PCV13) vaccine. One dose is recommended after age 54.  Pneumococcal polysaccharide (PPSV23) vaccine. One dose is recommended after age 33. Talk to your health care provider about which screenings and vaccines you need and how often you need them. This information is not intended to replace advice given to you by your health care provider. Make sure you discuss any questions you have with your health care provider. Document Released: 02/08/2015 Document Revised: 10/02/2015 Document Reviewed: 11/13/2014 Elsevier Interactive Patient Education  2017 [employer] Prevention in the Home Falls can cause injuries. They can happen to people of all ages. There are many things you can do to make your home safe and to help prevent falls. What can I do on the outside of my home?  Regularly fix the edges of walkways and driveways and fix any cracks.  Remove anything that might make you trip as you walk through a door, such as a raised step or threshold.  Trim any bushes or trees on the path to your home.  Use bright outdoor lighting.  Clear any walking paths of anything that might make someone trip, such as rocks or tools.  Regularly check to see if handrails are loose or broken. Make sure that both sides of any steps have handrails.  Any raised decks and porches should have guardrails on the edges.  Have any leaves, snow, or ice cleared regularly.  Use sand or salt on walking paths during winter.  Clean up any spills in your garage right away. This includes oil or grease spills. What can I do in the bathroom?  Use night lights.  Install grab bars by the toilet and in the tub and shower. Do not use towel bars as grab bars.  Use non-skid mats or decals in the tub or shower.  If you need to sit down in the shower, use a plastic, non-slip stool.  Keep the floor dry. Clean up any water that  spills on the floor as soon as it happens.  Remove soap buildup in the tub or shower regularly.  Attach bath mats securely with double-sided non-slip rug tape.  Do not have throw rugs and other things on the floor that can make you trip. What can I do in the bedroom?  Use night lights.  Make sure that you have a light by your bed that is easy to reach.  Do not use any sheets or blankets that are too big for your bed. They should not hang down onto the floor.  Have a firm chair that has side arms. You can use this for support while you get dressed.  Do not have throw rugs and other things on the floor that can make you trip. What can I do in the kitchen?  Clean up any spills right away.  Avoid walking on wet floors.  Keep items that you use a lot in easy-to-reach places.  If you need to reach something above you, use a strong step stool that has a grab bar.  Keep electrical cords out of the way.  Do not use floor polish or wax that makes floors slippery. If you must use wax, use non-skid floor wax.  Do not have throw rugs and other things on the floor that can make you trip. What can I do with my stairs?  Do not leave any items on the stairs.  Make sure that there are handrails on both sides of the stairs and use them. Fix handrails that are broken or loose. Make sure that handrails are as long as the stairways.  Check any carpeting to make sure that it is firmly attached to the stairs. Fix any carpet that is loose or worn.  Avoid having throw rugs at the top or bottom of the stairs. If you do have throw rugs, attach them to the floor with carpet tape.  Make sure that you have a light switch at the top of the stairs and the bottom of the stairs. If you do not have them, ask someone to add them for you. What else can I do to help prevent falls?  Wear shoes that:  Do not have high heels.  Have rubber bottoms.  Are comfortable and fit you well.  Are closed at the  toe. Do not wear sandals.  If you use a stepladder:  Make sure that it is fully opened. Do not climb a closed stepladder.  Make sure that both sides of the stepladder are locked into place.  Ask someone to hold it for you, if possible.  Clearly mark and make sure that you can see:  Any grab bars or handrails.  First and last steps.  Where the edge of each step is.  Use tools that help you move around (mobility aids) if they are needed. These include:  Canes.  Walkers.  Scooters.  Crutches.  Turn on the lights when you go into a dark area. Replace any light bulbs as soon as they burn out.  Set up your furniture so you have a clear path. Avoid moving your furniture around.  If any of your floors are uneven, fix them.  If there are any pets around you, be aware of where they are.  Review your medicines with your doctor. Some medicines can make you feel dizzy. This can increase your chance of falling. Ask your doctor what other things that you can do to help prevent falls. This information is not intended to replace advice given to you by your health care provider. Make sure you discuss any questions you have with your health care provider. Document Released: 11/08/2008 Document Revised: 06/20/2015 Document Reviewed: 02/16/2014 Elsevier Interactive Patient Education  2017 [employer].

## 2020-06-25 NOTE — Progress Notes (Signed)
Subjective:   EUDELL JULIAN is a 75 y.o. female who presents for Medicare Annual (Subsequent) preventive examination.  Review of Systems     Cardiac Risk Factors include: advanced age (>35men, >27 women);dyslipidemia;hypertension     Objective:    Today's Vitals   06/25/20 1313  BP: 118/70  Pulse: 79  Temp: 98.3 F (36.8 C)  SpO2: 99%  Weight: 141 lb 14.4 oz (64.4 kg)   Body mass index is 24.17 kg/m.  Advanced Directives 06/25/2020 11/04/2017 06/02/2016 05/28/2016 05/12/2016  Does Patient Have a Medical Advance Directive? Yes No Yes Yes No;Yes  Type of Advance Directive Healthcare Power of [place]  Does patient want to make changes to medical advance directive? - - - - No - Patient declined  Copy of [place] in Chart? No - copy requested - - No - copy requested No - copy requested  Would patient like information on creating a medical advance directive? - No - Patient declined - - No - Patient declined    Current Medications (verified) Outpatient Encounter Medications as of 06/25/2020  Medication Sig  . aspirin 81 MG chewable tablet Chew 81 mg by mouth daily.  . Cholecalciferol (VITAMIN D3) 400 UNITS CAPS Take 1 capsule by mouth daily.  . fluocinonide (LIDEX) 0.05 % external solution fluocinonide 0.05 % topical solution  APPLY 1 ML ON THE SKIN BID  . hydrocortisone 2.5 % cream hydrocortisone 2.5 % topical cream  . losartan (COZAAR) 50 MG tablet TAKE 1 TABLET EVERY DAY  . LYSINE PO Take by mouth as needed.   . MULTIPLE VITAMIN PO Take by mouth.  . Omega-3 Fatty Acids (FISH OIL) 1000 MG CAPS   . triamcinolone cream (KENALOG) 0.1 % Apply 1 application topically 2 (two) times daily.  . TURMERIC PO Take 750 mg by mouth daily.  . valACYclovir (VALTREX) 500 MG tablet Take 4 tablets (2,000 mg total) by mouth 2 (two) times daily. Prn cold sores  . docusate sodium (COLACE) 100 MG capsule Take 100 mg by  mouth 2 (two) times daily. (Patient not taking: Reported on 06/25/2020)   No facility-administered encounter medications on file as of 06/25/2020.    Allergies (verified) Fluticasone propionate, Loperamide, Flonase [fluticasone propionate], Fluticasone propionate, Loperamide hcl, Loperamide hcl, Other, Benadryl [diphenhydramine], and Lisinopril   History: Past Medical History:  Diagnosis Date  . Allergy   . Anal fissure   . Arthritis    hands  . Benign paroxysmal positional vertigo   . Complication of anesthesia   . Endometriosis   . Hemorrhoids   . History of skin cancer   . Hyperlipidemia   . Hyperplastic colon polyp   . Hypertension   . IBS (irritable bowel syndrome)   . Melanoma ([place]) 06/10/2016   right thumb   . Melanoma in situ ([place]) 05/31/2016  . Personal history of COVID-19   . PONV (postoperative nausea and vomiting)    Past Surgical History:  Procedure Laterality Date  . ABDOMINAL HYSTERECTOMY     bso enometriosis; no cancer  . AMPUTATION Right 06/02/2016   Procedure: AMPUTATION RIGHT THUMB INTERPHALANGEAL JOINT;  Surgeon: Daryll Brod, MD;  Location: [facility];  Service: Orthopedics;  Laterality: Right;  . BUNIONECTOMY Left 11/23/2008  . NAILBED REPAIR Right 05/12/2016   Procedure: EXCISION OF NAIL MATRIX BED RIGHT;  Surgeon: Daryll Brod, MD;  Location: [facility];  Service: Orthopedics;  Laterality: Right;  .  ROTATOR CUFF REPAIR Right 10/2009  . SKIN BIOPSY  03/16/2017   shave biopsy, right inferior helix, chondrodermatits nodularis helicis  . SKIN FULL THICKNESS GRAFT Right 05/12/2016   Procedure: UPPER ARM SKIN GRAFT RIGHT THUMB;  Surgeon: Daryll Brod, MD;  Location: [facility];  Service: Orthopedics;  Laterality: Right;  . thumb nail surgery Right    Family History  Problem Relation Age of Onset  . Diabetes Mother   . Stroke Mother   . Breast cancer Sister   . Colon polyps Sister   . Diabetes Sister   . Colon  polyps Sister   . Colon cancer Neg Hx    Social History   Socioeconomic History  . Marital status: Married    Spouse name: Not on file  . Number of children: Not on file  . Years of education: Not on file  . Highest education level: Not on file  Occupational History    Comment: retired   Tobacco Use  . Smoking status: Former Research scientist (life sciences)  . Smokeless tobacco: Never Used  Vaping Use  . Vaping Use: Never used  Substance and Sexual Activity  . Alcohol use: Yes    Alcohol/week: 0.0 standard drinks    Comment: rare  . Drug use: No  . Sexual activity: Not on file  Other Topics Concern  . 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   Social Determinants of Health   Financial Resource Strain: Low Risk   . Difficulty of Paying Living Expenses: Not hard at all  Food Insecurity: No Food Insecurity  . Worried About Charity fundraiser in the Last Year: Never true  . Ran Out of Food in the Last Year: Never true  Transportation Needs: No Transportation Needs  . Lack of Transportation (Medical): No  . Lack of Transportation (Non-Medical): No  Physical Activity: Insufficiently Active  . Days of Exercise per Week: 3 days  . Minutes of Exercise per Session: 40 min  Stress: No Stress Concern Present  . Feeling of Stress : Not at all  Social Connections: Moderately Integrated  . Frequency of Communication with Friends and Family: More than three times a week  . Frequency of Social Gatherings with Friends and Family: More than three times a week  . Attends Religious Services: More than 4 times per year  . Active Member of Clubs or Organizations: No  . Attends Archivist Meetings: Never  . Marital Status: Married    Tobacco Counseling Counseling  given: Not Answered   Clinical Intake:  Pre-visit preparation completed: Yes  Pain : No/denies pain     BMI - recorded: 24.17 Nutritional Status: BMI of 19-24  Normal Nutritional Risks: None Diabetes: No  How often do you need to have someone help you when you read instructions, pamphlets, or other written materials from your doctor or pharmacy?: 1 - Never  Diabetic?No   Interpreter Needed?: No  Information entered by :: Charlott Rakes, LPN   Activities of Daily Living In your present state of health, do you have any difficulty performing the following activities: 06/25/2020  Hearing? N  Vision?  N  Difficulty concentrating or making decisions? N  Walking or climbing stairs? N  Dressing or bathing? N  Doing errands, shopping? N  Preparing Food and eating ? N  Using the Toilet? N  In the past six months, have you accidently leaked urine? N  Do you have problems with loss of bowel control? N  Managing your Medications? N  Managing your Finances? N  Housekeeping or managing your Housekeeping? N  Some recent data might be hidden    Patient Care Team: Panosh, Standley Brooking, MD as PCP - General (Internal Medicine) Sydnee Cabal, MD (Orthopedic Surgery) amy Liou  Linda Hedges, DO as Consulting Physician (Obstetrics and Gynecology) Daryll Brod, MD as Consulting Physician (Orthopedic Surgery) Johnathan Hausen, MD as Consulting Physician (General Surgery) Ayesha Mohair (Dermatology)  Indicate any recent Medical Services you may have received from other than [facility] providers in the past year (date may be approximate).     Assessment:   This is a routine wellness examination for Tekeyah.  Hearing/Vision screen  Hearing Screening   125Hz  250Hz  500Hz  1000Hz  2000Hz  3000Hz  4000Hz  6000Hz  8000Hz   Right ear:           Left ear:           Comments: Pt wears hearing   Vision Screening Comments: Pt follows up with Dr Charma Igo for annual eye exams   Dietary issues and  exercise activities discussed: Current Exercise Habits: Home exercise routine;Structured exercise class, Type of exercise: walking;Other - see comments ([employer] classes), Time (Minutes): 45, Frequency (Times/Week): 3, Weekly Exercise (Minutes/Week): 135  Goals Addressed            This Visit's Progress   . Patient Stated       Maintain weight and stay active      Depression Screen PHQ 2/9 Scores 06/25/2020 02/06/2020 02/14/2019 02/03/2018 02/02/2017 12/12/2014 10/27/2013  PHQ - 2 Score 0 0 0 0 0 0 0    Fall Risk Fall Risk  06/25/2020 02/06/2020 02/14/2019 02/03/2018 02/02/2017  Falls in the past year? 0 0 0 0 No  Number falls in past yr: 0 0 0 0 -  Injury with Fall? 0 - 0 0 -  Risk for fall due to : Impaired vision - - - -  Follow up Falls prevention discussed - Falls evaluation completed - -    FALL RISK PREVENTION PERTAINING TO THE HOME:  Any stairs in or around the home? Yes  If so, are there any without handrails? No  Home free of loose throw rugs in walkways, pet beds, electrical cords, etc? Yes  Adequate lighting in your home to reduce risk of falls? Yes   ASSISTIVE DEVICES UTILIZED TO PREVENT FALLS:  Life alert? No  Use of a cane, walker or w/c? No  Grab bars in the bathroom? No  Shower chair or bench in shower? No  Elevated toilet seat or a handicapped toilet? No   TIMED UP AND GO:  Was the test performed? Yes .  Length of time to ambulate 10 feet: 10 sec.   Gait steady and fast without use of assistive device  Cognitive Function:     6CIT Screen 06/25/2020  What Year? 0 points  What month? 0 points  What time? 0 points  Count back from 20 0 points  Months in reverse 0 points  Repeat phrase 0 points  Total Score 0    Immunizations Immunization History  Administered Date(s) Administered  . Fluad Quad(high Dose  65+) 10/18/2018  . Influenza Split 09/26/2012, 10/25/2013  . Influenza Whole 10/26/2008  . Influenza, High Dose Seasonal PF 12/16/2016, 11/11/2017  .  Influenza, Seasonal, Injecte, Preservative Fre 10/30/2015  . Influenza,Quad,Nasal, Live 09/26/2012, 10/25/2013  . Influenza-Unspecified 10/30/2015, 10/18/2018  . Pneumococcal Conjugate-13 01/25/2013  . Pneumococcal Polysaccharide-23 01/09/2009  . Td 01/26/2005  . Zoster, Live 01/26/2012    TDAP status: Due, Education has been provided regarding the importance of this vaccine. Advised may receive this vaccine at local pharmacy or Health Dept. Aware to provide a copy of the vaccination record if obtained from local pharmacy or Health Dept. Verbalized acceptance and understanding.  Flu Vaccine status: Up to date  Pneumococcal vaccine status: Up to date  Covid-19 vaccine status: Declined, Education has been provided regarding the importance of this vaccine but patient still declined. Advised may receive this vaccine at local pharmacy or Health Dept.or vaccine clinic. Aware to provide a copy of the vaccination record if obtained from local pharmacy or Health Dept. Verbalized acceptance and understanding.  Qualifies for Shingles Vaccine? Yes   Zostavax completed Yes   Shingrix Completed?: No.    Education has been provided regarding the importance of this vaccine. Patient has been advised to call insurance company to determine out of pocket expense if they have not yet received this vaccine. Advised may also receive vaccine at local pharmacy or Health Dept. Verbalized acceptance and understanding.  Screening Tests Health Maintenance  Topic Date Due  . COVID-19 Vaccine (1) Never done  . Zoster Vaccines- Shingrix (1 of 2) Never done  . TETANUS/TDAP  02/05/2021 (Originally 01/27/2015)  . INFLUENZA VACCINE  08/26/2020  . DEXA SCAN  Completed  . Hepatitis C Screening  Completed  . PNA vac Low Risk Adult  Completed  . HPV VACCINES  Aged Out    Health Maintenance  Health Maintenance Due  Topic Date Due  . COVID-19 Vaccine (1) Never done  . Zoster Vaccines- Shingrix (1 of 2) Never done     Colorectal cancer screening: No longer required. per pt  Mammogram status: Completed 07/11/19. Repeat every year  Bone Density status: Completed 07/11/19. Results reflect: Bone density results: OSTEOPENIA. Repeat every 2-3 years.  Additional Screening:  Hepatitis C Screening:  Completed 02/03/18  Vision Screening: Recommended annual ophthalmology exams for early detection of glaucoma and other disorders of the eye. Is the patient up to date with their annual eye exam?  Yes  Who is the provider or what is the name of the office in which the patient attends annual eye exams? Dr Charma Igo If pt is not established with a provider, would they like to be referred to a provider to establish care? No .   Dental Screening: Recommended annual dental exams for proper oral hygiene  Community Resource Referral / Chronic Care Management: CRR required this visit?  No   CCM required this visit?  No      Plan:     I have personally reviewed and noted the following in the patient's chart:   . Medical and social history . Use of alcohol, tobacco or illicit drugs  . Current medications and supplements including opioid prescriptions.  . Functional ability and status . Nutritional status . Physical activity . Advanced directives . List of other physicians . Hospitalizations, surgeries, and ER visits in previous 12 months . Vitals . Screenings to include cognitive, depression, and falls . Referrals and appointments  In addition, I have reviewed and discussed with patient certain preventive protocols,  quality metrics, and best practice recommendations. A written personalized care plan for preventive services as well as general preventive health recommendations were provided to patient.     Willette Brace, LPN   [id number]   Nurse Notes: None

## 2020-07-16 DIAGNOSIS — Z803 Family history of malignant neoplasm of breast: Secondary | ICD-10-CM | POA: Diagnosis not present

## 2020-07-16 DIAGNOSIS — Z1231 Encounter for screening mammogram for malignant neoplasm of breast: Secondary | ICD-10-CM | POA: Diagnosis not present

## 2020-07-16 LAB — HM MAMMOGRAPHY

## 2020-07-22 ENCOUNTER — Encounter: Payer: Self-pay | Admitting: Internal Medicine

## 2020-07-31 DIAGNOSIS — L900 Lichen sclerosus et atrophicus: Secondary | ICD-10-CM | POA: Diagnosis not present

## 2020-07-31 DIAGNOSIS — Z6824 Body mass index (BMI) 24.0-24.9, adult: Secondary | ICD-10-CM | POA: Diagnosis not present

## 2020-07-31 DIAGNOSIS — Z01419 Encounter for gynecological examination (general) (routine) without abnormal findings: Secondary | ICD-10-CM | POA: Diagnosis not present

## 2020-09-21 ENCOUNTER — Other Ambulatory Visit: Payer: Self-pay | Admitting: Internal Medicine

## 2020-10-29 DIAGNOSIS — Z85828 Personal history of other malignant neoplasm of skin: Secondary | ICD-10-CM | POA: Diagnosis not present

## 2020-10-29 DIAGNOSIS — L821 Other seborrheic keratosis: Secondary | ICD-10-CM | POA: Diagnosis not present

## 2020-10-29 DIAGNOSIS — Z08 Encounter for follow-up examination after completed treatment for malignant neoplasm: Secondary | ICD-10-CM | POA: Diagnosis not present

## 2020-10-29 DIAGNOSIS — L910 Hypertrophic scar: Secondary | ICD-10-CM | POA: Diagnosis not present

## 2020-10-29 DIAGNOSIS — Z85820 Personal history of malignant melanoma of skin: Secondary | ICD-10-CM | POA: Diagnosis not present

## 2020-10-29 DIAGNOSIS — H40013 Open angle with borderline findings, low risk, bilateral: Secondary | ICD-10-CM | POA: Diagnosis not present

## 2020-10-29 DIAGNOSIS — L814 Other melanin hyperpigmentation: Secondary | ICD-10-CM | POA: Diagnosis not present

## 2020-10-29 DIAGNOSIS — H524 Presbyopia: Secondary | ICD-10-CM | POA: Diagnosis not present

## 2020-10-29 DIAGNOSIS — D225 Melanocytic nevi of trunk: Secondary | ICD-10-CM | POA: Diagnosis not present

## 2020-12-03 DIAGNOSIS — M79641 Pain in right hand: Secondary | ICD-10-CM | POA: Diagnosis not present

## 2021-02-25 NOTE — Progress Notes (Signed)
Chief Complaint  Patient presents with   Annual Exam    Fasting     HPI: Patient  Sheila Livingston  76 y.o. comes in today for Preventive Health Care visit and med evaluation Taking losartan blood pressures been fine no complaints of side effects Has been having some tenderness left side of neck not sure if there is a lump or may have just pulled a muscle. Generally doing well husband just had cataract surgery and she is having to apply frequent drops. Sees dermatologist every 6 months GYN yearly Has not had the Shingrix vaccine or the COVID-vaccine but has had infection with COVID.19  Health Maintenance  Topic Date Due   TETANUS/TDAP  01/27/2015   COVID-19 Vaccine (1) 08/26/2021 (Originally 11/25/1945)   Zoster Vaccines- Shingrix (1 of 2) 08/26/2021 (Originally 05/26/1964)   Pneumonia Vaccine 47+ Years old  Completed   INFLUENZA VACCINE  Completed   DEXA SCAN  Completed   Hepatitis C Screening  Completed   HPV VACCINES  Aged Out   COLONOSCOPY (Pts 45-13yrs Insurance coverage will need to be confirmed)  Discontinued   Health Maintenance Review LIFESTYLE:  Exercise:   walks dog  not as much gym   Tobacco/ETS: n Alcohol:  ocass  Sugar beverages: n  Sleep:  about 7   Drug use: no HH of   2 pet dog   Derm every 6 mos  Gyne yearly  t and eye doc .    ROS:  G   REST of 12 system review negative except as per HPI   Past Medical History:  Diagnosis Date   Allergy    Anal fissure    Arthritis    hands   Benign paroxysmal positional vertigo    Complication of anesthesia    Endometriosis    Hemorrhoids    History of skin cancer    Hyperlipidemia    Hyperplastic colon polyp    Hypertension    IBS (irritable bowel syndrome)    Melanoma ([place]) 06/10/2016   right thumb    Melanoma in situ ([place]) 05/31/2016   Personal history of COVID-19    PONV (postoperative nausea and vomiting)     Past Surgical History:  Procedure Laterality Date   ABDOMINAL HYSTERECTOMY     bso  enometriosis; no cancer   AMPUTATION Right 06/02/2016   Procedure: AMPUTATION RIGHT THUMB INTERPHALANGEAL JOINT;  Surgeon: Daryll Brod, MD;  Location: [place];  Service: Orthopedics;  Laterality: Right;   BUNIONECTOMY Left 11/23/2008   NAILBED REPAIR Right 05/12/2016   Procedure: EXCISION OF NAIL MATRIX BED RIGHT;  Surgeon: Daryll Brod, MD;  Location: [place];  Service: Orthopedics;  Laterality: Right;   ROTATOR CUFF REPAIR Right 10/2009   SKIN BIOPSY  03/16/2017   shave biopsy, right inferior helix, chondrodermatits nodularis helicis   SKIN FULL THICKNESS GRAFT Right 05/12/2016   Procedure: UPPER ARM SKIN GRAFT RIGHT THUMB;  Surgeon: Daryll Brod, MD;  Location: [place];  Service: Orthopedics;  Laterality: Right;   thumb nail surgery Right     Family History  Problem Relation Age of Onset   Diabetes Mother    Stroke Mother    Breast cancer Sister    Colon polyps Sister    Diabetes Sister    Colon polyps Sister    Colon cancer Neg Hx     Social History   Socioeconomic History   Marital status: Married    Spouse name: Not on  file   Number of children: Not on file   Years of education: Not on file   Highest education level: Not on file  Occupational History    Comment: retired   Tobacco Use   Smoking status: Former   Smokeless tobacco: Never  Scientific laboratory technician Use: Never used  Substance and Sexual Activity   Alcohol use: Yes    Alcohol/week: 0.0 standard drinks    Comment: rare   Drug use: No   Sexual activity: Not on file  Other Topics Concern   Not on file  Social History Narrative   Widowed   Remarried 2015 2016   [facility] of 2   Dog and Cat   6-7 hours of sleep   Mom passed away 30-Dec-2009   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   Social Determinants of Health   Financial Resource Strain: Low Risk    Difficulty of Paying  Living Expenses: Not hard at all  Food Insecurity: No Food Insecurity   Worried About Charity fundraiser in the Last Year: Never true   [place] in the Last Year: Never true  Transportation Needs: No Transportation Needs   Lack of Transportation (Medical): No   Lack of Transportation (Non-Medical): No  Physical Activity: Insufficiently Active   Days of Exercise per Week: 3 days   Minutes of Exercise per Session: 40 min  Stress: No Stress Concern Present   Feeling of Stress : Not at all  Social Connections: Moderately Integrated   Frequency of Communication with Friends and Family: More than three times a week   Frequency of Social Gatherings with Friends and Family: More than three times a week   Attends Religious Services: More than 4 times per year   Active Member of Clubs or Organizations: No   Attends Archivist Meetings: Never   Marital Status: Married    Outpatient Medications Prior to Visit  Medication Sig Dispense Refill   aspirin 81 MG chewable tablet Chew 81 mg by mouth daily.     Cholecalciferol (VITAMIN D3) 400 UNITS CAPS Take 1 capsule by mouth daily.     fluocinonide (LIDEX) 0.05 % external solution fluocinonide 0.05 % topical solution  APPLY 1 ML ON THE SKIN BID     hydrocortisone 2.5 % cream hydrocortisone 2.5 % topical cream     losartan (COZAAR) 50 MG tablet TAKE 1 TABLET EVERY DAY 90 tablet 1   LYSINE PO Take by mouth as needed.      MULTIPLE VITAMIN PO Take by mouth.     Omega-3 Fatty Acids (FISH OIL) 1000 MG CAPS      triamcinolone cream (KENALOG) 0.1 % Apply 1 application topically 2 (two) times daily.     TURMERIC PO Take 750 mg by mouth daily.     valACYclovir (VALTREX) 500 MG tablet Take 4 tablets (2,000 mg total) by mouth 2 (two) times daily. Prn cold sores 60 tablet 2   docusate sodium (COLACE) 100 MG capsule Take 100 mg by mouth 2 (two) times daily. (Patient not taking: Reported on 02/26/2021)     No facility-administered medications  prior to visit.     EXAM:  BP 128/78 (BP Location: Left Arm, Patient Position: Sitting, Cuff Size: Normal)    Pulse 69    Temp 97.7 F (36.5  C) (Oral)    Ht 5' 3.78" (1.62 m)    Wt 140 lb 6.4 oz (63.7 kg)    SpO2 99%    BMI 24.27 kg/m   Body mass index is 24.27 kg/m. Wt Readings from Last 3 Encounters:  02/26/21 140 lb 6.4 oz (63.7 kg)  06/25/20 141 lb 14.4 oz (64.4 kg)  02/06/20 139 lb 6.4 oz (63.2 kg)    Physical Exam: Vital signs reviewed WUJ:WJXB is a well-developed well-nourished alert cooperative    who appearsr stated age in no acute distress.  HEENT: normocephalic atraumatic , Eyes: PERRL EOM's full, conjunctiva clear, Nares: paten,t no deformity discharge or tenderness., Ears: no deformity EAC's clear TMs with normal landmarks. Mouth:NECK: supple wno bruits thyroid palpable asymmetric left larger than right.  But no obvious adenopathy.  Some muscle tightness on the left trapezius sternocleidomastoid. CHEST/PULM:  Clear to auscultation and percussion breath sounds equal no wheeze , rales or rhonchi. Breast: normal by inspection . No dimpling, discharge, masses, tenderness or discharge . CV: PMI is nondisplaced, S1 S2 no gallops, murmurs, rubs. Peripheral pulses are full without delay.No JVD .  ABDOMEN: Bowel sounds normal nontender  No guard or rebound, no hepato splenomegal no CVA tenderness.  No hernia. Extremtities:  No clubbing cyanosis or edema, no acute joint swelling or redness no focal atrophy  distal amputation right finger  NEURO:  Oriented x3, cranial nerves 3-12 appear to be intact, no obvious focal weakness,gait within normal limits no abnormal reflexes or asymmetrical SKIN: No acute rashes normal turgor, color, no bruising or petechiae. PSYCH: Oriented, good eye contact, no obvious depression anxiety, cognition and judgment appear normal. LN: no cervical axillary inguinal adenopathy  Lab Results  Component Value Date   WBC 6.0 02/08/2020   HGB 12.6 02/08/2020    HCT 36.6 02/08/2020   PLT 220.0 02/08/2020   GLUCOSE 98 02/08/2020   CHOL 246 (H) 02/08/2020   TRIG 125.0 02/08/2020   HDL 76.50 02/08/2020   LDLDIRECT 143.0 02/14/2019   LDLCALC 144 (H) 02/08/2020   ALT 15 02/08/2020   AST 19 02/08/2020   NA 139 02/08/2020   K 3.8 02/08/2020   CL 106 02/08/2020   CREATININE 0.81 02/08/2020   BUN 12 02/08/2020   CO2 27 02/08/2020   TSH 2.57 02/08/2020    BP Readings from Last 3 Encounters:  02/26/21 128/78  06/25/20 118/70  02/06/20 122/64    Lab monitoring reviewed with patient  fasting today   ASSESSMENT AND PLAN:  Discussed the following assessment and plan:    ICD-10-CM   1. Visit for preventive health examination  Z00.00     2. Medication management  J47.829 Basic metabolic panel    CBC with Differential/Platelet    Hepatic function panel    Lipid panel    TSH    T4, free    T4, free    TSH    Lipid panel    Hepatic function panel    CBC with Differential/Platelet    Basic metabolic panel    3. Essential hypertension  F62 Basic metabolic panel    CBC with Differential/Platelet    Hepatic function panel    Lipid panel    TSH    T4, free    T4, free    TSH    Lipid panel    Hepatic function panel    CBC with Differential/Platelet    Basic metabolic panel   controlled     4. Hyperlipidemia, unspecified hyperlipidemia  type  R74.0 Basic metabolic panel    CBC with Differential/Platelet    Hepatic function panel    Lipid panel    TSH    T4, free    T4, free    TSH    Lipid panel    Hepatic function panel    CBC with Differential/Platelet    Basic metabolic panel    5. History of melanoma in situ  C14.481 Basic metabolic panel    CBC with Differential/Platelet    Hepatic function panel    Lipid panel    TSH    T4, free    T4, free    TSH    Lipid panel    Hepatic function panel    CBC with Differential/Platelet    Basic metabolic panel    6. Thyroid enlarged  E56.3 Basic metabolic panel    CBC  with Differential/Platelet    Hepatic function panel    Lipid panel    TSH    T4, free    T4, free    TSH    Lipid panel    Hepatic function panel    CBC with Differential/Platelet    Basic metabolic panel    US THYROID   left lobe  vs nodule     Return for depending on results or yearly .  Patient Care Team: Makailyn Mccormick, Standley Brooking, MD as PCP - General (Internal Medicine) Sydnee Cabal, MD (Orthopedic Surgery) amy Bagg  Linda Hedges, DO as Consulting Physician (Obstetrics and Gynecology) Daryll Brod, MD as Consulting Physician (Orthopedic Surgery) Johnathan Hausen, MD as Consulting Physician (General Surgery) Ayesha Mohair (Dermatology) Patient Instructions  Good to see you today . Exam is good  but left thyroid is prominent  . Will order  ultrasound of thyroid to get a better look . I do not feel any swollen glands of significance.   Can get shingrix vaccine a your  pharmacy .        Standley Brooking. Ramon Zanders [name]

## 2021-02-26 ENCOUNTER — Ambulatory Visit (INDEPENDENT_AMBULATORY_CARE_PROVIDER_SITE_OTHER): Payer: Medicare HMO | Admitting: Internal Medicine

## 2021-02-26 ENCOUNTER — Encounter: Payer: Self-pay | Admitting: Internal Medicine

## 2021-02-26 VITALS — BP 128/78 | HR 69 | Temp 97.7°F | Ht 63.78 in | Wt 140.4 lb

## 2021-02-26 DIAGNOSIS — Z79899 Other long term (current) drug therapy: Secondary | ICD-10-CM

## 2021-02-26 DIAGNOSIS — E049 Nontoxic goiter, unspecified: Secondary | ICD-10-CM | POA: Diagnosis not present

## 2021-02-26 DIAGNOSIS — Z0000 Encounter for general adult medical examination without abnormal findings: Secondary | ICD-10-CM | POA: Diagnosis not present

## 2021-02-26 DIAGNOSIS — E785 Hyperlipidemia, unspecified: Secondary | ICD-10-CM

## 2021-02-26 DIAGNOSIS — Z86006 Personal history of melanoma in-situ: Secondary | ICD-10-CM | POA: Diagnosis not present

## 2021-02-26 DIAGNOSIS — I10 Essential (primary) hypertension: Secondary | ICD-10-CM

## 2021-02-26 LAB — BASIC METABOLIC PANEL
BUN: 14 mg/dL (ref 6–23)
CO2: 30 mEq/L (ref 19–32)
Calcium: 9.9 mg/dL (ref 8.4–10.5)
Chloride: 103 mEq/L (ref 96–112)
Creatinine, Ser: 0.85 mg/dL (ref 0.40–1.20)
GFR: 65.93 mL/min (ref >60.00)
Glucose, Bld: 93 mg/dL (ref 70–99)
Potassium: 4.5 mEq/L (ref 3.5–5.1)
Sodium: 137 mEq/L (ref 135–145)

## 2021-02-26 LAB — LIPID PANEL
Cholesterol: 220 mg/dL — ABNORMAL HIGH (ref 0–200)
HDL: 76.2 mg/dL (ref >39.00)
LDL Cholesterol: 120 mg/dL — ABNORMAL HIGH (ref 0–99)
NonHDL: 143.54
Total CHOL/HDL Ratio: 3
Triglycerides: 118 mg/dL (ref 0.0–149.0)
VLDL: 23.6 mg/dL (ref 0.0–40.0)

## 2021-02-26 LAB — CBC WITH DIFFERENTIAL/PLATELET
Basophils Absolute: 0 10*3/uL (ref 0.0–0.1)
Basophils Relative: 0.3 % (ref 0.0–3.0)
Eosinophils Absolute: 0.1 10*3/uL (ref 0.0–0.7)
Eosinophils Relative: 2 % (ref 0.0–5.0)
HCT: 39 % (ref 36.0–46.0)
Hemoglobin: 13.2 g/dL (ref 12.0–15.0)
Lymphocytes Relative: 39.7 % (ref 12.0–46.0)
Lymphs Abs: 2.4 10*3/uL (ref 0.7–4.0)
MCHC: 33.8 g/dL (ref 30.0–36.0)
MCV: 91.6 fl (ref 78.0–100.0)
Monocytes Absolute: 0.4 10*3/uL (ref 0.1–1.0)
Monocytes Relative: 6.9 % (ref 3.0–12.0)
Neutro Abs: 3.1 10*3/uL (ref 1.4–7.7)
Neutrophils Relative %: 51.1 % (ref 43.0–77.0)
Platelets: 230 10*3/uL (ref 150.0–400.0)
RBC: 4.25 Mil/uL (ref 3.87–5.11)
RDW: 13.1 % (ref 11.5–15.5)
WBC: 6.1 10*3/uL (ref 4.0–10.5)

## 2021-02-26 LAB — TSH: TSH: 0.77 u[IU]/mL (ref 0.35–5.50)

## 2021-02-26 LAB — HEPATIC FUNCTION PANEL
ALT: 16 U/L (ref 0–35)
AST: 18 U/L (ref 0–37)
Albumin: 4.4 g/dL (ref 3.5–5.2)
Alkaline Phosphatase: 69 U/L (ref 39–117)
Bilirubin, Direct: 0.1 mg/dL (ref 0.0–0.3)
Total Bilirubin: 0.6 mg/dL (ref 0.2–1.2)
Total Protein: 7.6 g/dL (ref 6.0–8.3)

## 2021-02-26 LAB — T4, FREE: Free T4: 0.83 ng/dL (ref 0.60–1.60)

## 2021-02-26 NOTE — Patient Instructions (Addendum)
Good to see you today . Exam is good  but left thyroid is prominent  . Will order  ultrasound of thyroid to get a better look . I do not feel any swollen glands of significance.   Can get shingrix vaccine a your  pharmacy .

## 2021-02-28 ENCOUNTER — Other Ambulatory Visit: Payer: Self-pay

## 2021-02-28 ENCOUNTER — Ambulatory Visit (HOSPITAL_BASED_OUTPATIENT_CLINIC_OR_DEPARTMENT_OTHER)
Admission: RE | Admit: 2021-02-28 | Discharge: 2021-02-28 | Disposition: A | Discharge status: Home / Self Care | Payer: Medicare HMO | Source: Ambulatory Visit | Attending: Internal Medicine | Admitting: Internal Medicine

## 2021-02-28 DIAGNOSIS — E049 Nontoxic goiter, unspecified: Secondary | ICD-10-CM | POA: Insufficient documentation

## 2021-02-28 DIAGNOSIS — E041 Nontoxic single thyroid nodule: Secondary | ICD-10-CM | POA: Diagnosis not present

## 2021-02-28 DIAGNOSIS — E042 Nontoxic multinodular goiter: Secondary | ICD-10-CM | POA: Diagnosis not present

## 2021-02-28 DIAGNOSIS — E010 Iodine-deficiency related diffuse (endemic) goiter: Secondary | ICD-10-CM | POA: Diagnosis not present

## 2021-03-01 ENCOUNTER — Encounter: Payer: Self-pay | Admitting: Internal Medicine

## 2021-03-02 NOTE — Progress Notes (Signed)
Thyroid nodule on left advised  for follow up by radiologist  . Advise Needle "biopsy " to further assess. And may have a follow up with endocrinology depending on results .  Please do referral for Fine Needle  thyroid biopsy for left thyroid nodule and then FU ( virtual ok )

## 2021-03-02 NOTE — Progress Notes (Signed)
Blood results are normal except cholesterol that is better then  last year.  Thyroid tests are normal .  Thyroid ultrasound shows some nodules that need follow up( see [place] results)

## 2021-03-03 ENCOUNTER — Other Ambulatory Visit: Payer: Self-pay

## 2021-03-03 DIAGNOSIS — E041 Nontoxic single thyroid nodule: Secondary | ICD-10-CM

## 2021-03-03 NOTE — Addendum Note (Signed)
Addended by: Nilda Riggs on: 03/03/2021 08:24 AM   Modules accepted: Orders

## 2021-03-03 NOTE — Telephone Encounter (Signed)
Pt informed of the results and verbalized understanding.

## 2021-03-13 ENCOUNTER — Other Ambulatory Visit (HOSPITAL_COMMUNITY)
Admission: RE | Admit: 2021-03-13 | Discharge: 2021-03-13 | Disposition: A | Discharge status: Home / Self Care | Payer: Medicare HMO | Source: Ambulatory Visit | Attending: Physician Assistant | Admitting: Physician Assistant

## 2021-03-13 ENCOUNTER — Ambulatory Visit
Admission: RE | Admit: 2021-03-13 | Discharge: 2021-03-13 | Disposition: A | Discharge status: Home / Self Care | Payer: Medicare HMO | Source: Ambulatory Visit | Attending: Internal Medicine | Admitting: Internal Medicine

## 2021-03-13 DIAGNOSIS — E041 Nontoxic single thyroid nodule: Secondary | ICD-10-CM | POA: Diagnosis not present

## 2021-03-17 LAB — CYTOLOGY - NON PAP

## 2021-03-17 NOTE — Progress Notes (Signed)
So thyroid aspiration result showed atypia of undetermined significance.  So we cannot say it is totally benign and ignore it.but no dx  I would advise a referral to endocrinology for further opinion. On thyroid nodule  Please do referral.

## 2021-03-18 ENCOUNTER — Other Ambulatory Visit: Payer: Self-pay

## 2021-03-18 ENCOUNTER — Encounter: Payer: Self-pay | Admitting: Internal Medicine

## 2021-03-18 DIAGNOSIS — E041 Nontoxic single thyroid nodule: Secondary | ICD-10-CM

## 2021-03-18 NOTE — Telephone Encounter (Signed)
Referral has been placed and pt is aware. 

## 2021-03-19 ENCOUNTER — Encounter: Payer: Self-pay | Admitting: Internal Medicine

## 2021-03-25 ENCOUNTER — Encounter: Payer: Self-pay | Admitting: Internal Medicine

## 2021-03-25 DIAGNOSIS — E041 Nontoxic single thyroid nodule: Secondary | ICD-10-CM

## 2021-03-25 NOTE — Telephone Encounter (Signed)
Ok to do referral to other endocrinology group for :thyroid nodule bethesda 3 atypical in determinant biopsy.

## 2021-04-01 ENCOUNTER — Encounter (HOSPITAL_COMMUNITY): Payer: Self-pay

## 2021-04-02 NOTE — Progress Notes (Signed)
The molecular pathology report on the thyroid nodule fortunately looks like low risk of malignancy.  Thus most likely benign nodule ?I still want you to see an endocrinologist.  I believe we have placed referrals.  Let us know if further problem with this

## 2021-04-03 ENCOUNTER — Telehealth: Payer: Self-pay | Admitting: Internal Medicine

## 2021-04-03 ENCOUNTER — Encounter: Payer: Self-pay | Admitting: Internal Medicine

## 2021-04-03 NOTE — Telephone Encounter (Signed)
Patient would like to speak with Dr.Panosh. I asked what the call will be regarding but patient declined and said it is a Air traffic controller. Patient doesn't want to speak to anyone other than Dr.Panosh. ? ?Please advise. ?

## 2021-04-07 NOTE — Telephone Encounter (Signed)
Spoke with patient and she wanted to discuss seeing Dr. Cruzita Lederer, appt is scheduled for 04/21/21 ?

## 2021-04-21 ENCOUNTER — Other Ambulatory Visit: Payer: Self-pay

## 2021-04-21 ENCOUNTER — Encounter: Payer: Self-pay | Admitting: Internal Medicine

## 2021-04-21 ENCOUNTER — Ambulatory Visit: Payer: Medicare HMO | Admitting: Internal Medicine

## 2021-04-21 VITALS — BP 128/82 | HR 82 | Ht 63.78 in | Wt 143.2 lb

## 2021-04-21 DIAGNOSIS — E042 Nontoxic multinodular goiter: Secondary | ICD-10-CM | POA: Diagnosis not present

## 2021-04-21 NOTE — Progress Notes (Signed)
Patient ID: Sheila Livingston, female   DOB: 17-Sep-1945, 76 y.o.   MRN: [id number] ? ?This visit occurred during the SARS-CoV-2 public health emergency.  Safety protocols were in place, including screening questions prior to the visit, additional usage of staff PPE, and extensive cleaning of exam room while observing appropriate contact time as indicated for disinfecting solutions.  ? ?HPI  ?Sheila Livingston is a 76 y.o.-year-old female, referred by her PCP, Dr.Panosh, for evaluation for and management of thyroid nodules. ? ?She was found to have thyroid nodules after she complained of neck muscle pain at her most recent visit with PCP.  At that time, her thyroid was palpated and was found to be enlarged.  She was sent for thyroid ultrasound. ? ?Thyroid U/S (03/01/2021): ?Several nodules, with the dominant left mid nodule measuring 2.7 x 1.3 x 2.1 cm, mixed solid/cystic, with punctate echogenic foci: ? ?Parenchymal Echotexture: Moderately heterogenous  ?Isthmus: 0.3 cm  ?Right lobe: 5.7 x 1.6 x 1.9 cm  ?Left lobe: 6.0 x 1.9 x 2.5 cm  ?_________________________________________________________ ?  ?Estimated total number of nodules >/= 1 cm: 5 ?_________________________________________________________ ?  ?Nodule # 2:  ?Location: RIGHT; Mid  ?Maximum size: 1.4 cm; Other 2 dimensions: 1.0 x 1.1 cm  ?Composition: mixed cystic and solid (1)  ?This nodule does NOT meet TI-RADS criteria for biopsy or dedicated follow-up.  ?_________________________________________________________ ?  ?Nodule # 3:  ?Location: RIGHT; Mid /to the RIGHT of the isthmus  ?Maximum size: 1.0 cm; Other 2 dimensions: 0.4 x 1.0 cm  ?Composition: solid/almost completely solid (2)  ?Echogenicity: hypoechoic (2) ?*Given size (>/= 1 - 1.4 cm) and appearance, a follow-up ultrasound ?in 1 year should be considered based on TI-RADS criteria.  ?_________________________________________________________ ?  ?Nodule # 5:  ?Location: LEFT; Mid  ?Maximum size: 2.7 cm; Other 2  dimensions: 1.3 x 2.1 cm  ?Composition: mixed cystic and solid (1) ?Echogenic foci: punctate echogenic foci (3) ?**Given size (>/= 1.5 cm) and appearance, fine needle aspiration of ?this moderately suspicious nodule should be considered based on ?TI-RADS criteria.  ?_________________________________________________________ ?  ?Nodule labeled #1 is spongiform and measures up to 1.0 cm, and ?nodule labeled #4 is cystic and measures up to 1.0 cm. These nodules ?do NOT meet TI-RADS criteria for biopsy or dedicated follow-up. ?  ?No cervical adenopathy or abnormal fluid collection within the imaged neck. ? ? ?  ?L lobe: ? ?FNA (03/13/2021): ?Atypia of unknown significance; Afirma molecular analysis: benign ? ?Pt denies: ?- feeling nodules in neck, but she does feel mild discomfort with turning head or looking down ?- hoarseness ?- dysphagia ?- choking ?- SOB with lying down ? ?I reviewed pt's thyroid tests: ?Lab Results  ?Component Value Date  ? TSH 0.77 02/26/2021  ? TSH 2.57 02/08/2020  ? TSH 0.90 02/03/2018  ? TSH 1.18 12/12/2014  ? TSH 1.40 10/27/2013  ? TSH 1.10 01/11/2013  ? TSH 1.30 01/11/2012  ? TSH 1.16 01/07/2011  ? TSH 0.79 01/03/2010  ? TSH 0.88 01/01/2009  ? FREET4 0.83 02/26/2021  ? FREET4 0.81 12/12/2014  ? FREET4 1.0 10/29/2006  ?  ?Pt denies: ?- fatigue ?- heat intolerance/cold intolerance ?- tremors ?- palpitations ?- anxiety/depression ?- hyperdefecation/constipation ?- weight loss/weight gain ?- dry skin ?- hair loss ? ?+ FH of thyroid ds.: mother - ? Nodule  No FH of thyroid cancer. No h/o radiation tx to head or neck. ? ?No steroid use. No herbal supplements. No Biotin supplements or Hair, Skin and Nails  vitamins. ? ?Pt also has a history of melanoma >> had 1/2 R thumb removed. ? ?ROS: ?Constitutional: + See HPI  ?Eyes: no blurry vision, no xerophthalmia ?ENT: no sore throat,  + see HPI ?Cardiovascular: no CP/SOB/palpitations/leg swelling ?Respiratory: no cough/SOB ?Gastrointestinal: no  N/V/D/C ?Musculoskeletal: no muscle/joint aches ?Skin: no rashes ?Neurological: no tremors/numbness/tingling/dizziness ?Psychiatric: no depression/anxiety ? ?Past Medical History:  ?Diagnosis Date  ? Allergy   ? Anal fissure   ? Arthritis   ? hands  ? Benign paroxysmal positional vertigo   ? Complication of anesthesia   ? Endometriosis   ? Hemorrhoids   ? History of skin cancer   ? Hyperlipidemia   ? Hyperplastic colon polyp   ? Hypertension   ? IBS (irritable bowel syndrome)   ? Melanoma ([place]) 06/10/2016  ? right thumb   ? Melanoma in situ ([place]) 05/31/2016  ? Personal history of COVID-19   ? PONV (postoperative nausea and vomiting)   ? ?Past Surgical History:  ?Procedure Laterality Date  ? ABDOMINAL HYSTERECTOMY    ? bso enometriosis; no cancer  ? AMPUTATION Right 06/02/2016  ? Procedure: AMPUTATION RIGHT THUMB INTERPHALANGEAL JOINT;  Surgeon: Daryll Brod, MD;  Location: [place];  Service: Orthopedics;  Laterality: Right;  ? BUNIONECTOMY Left 11/23/2008  ? NAILBED REPAIR Right 05/12/2016  ? Procedure: EXCISION OF NAIL MATRIX BED RIGHT;  Surgeon: Daryll Brod, MD;  Location: [place];  Service: Orthopedics;  Laterality: Right;  ? ROTATOR CUFF REPAIR Right 10/2009  ? SKIN BIOPSY  03/16/2017  ? shave biopsy, right inferior helix, chondrodermatits nodularis helicis  ? SKIN FULL THICKNESS GRAFT Right 05/12/2016  ? Procedure: UPPER ARM SKIN GRAFT RIGHT THUMB;  Surgeon: Daryll Brod, MD;  Location: [place];  Service: Orthopedics;  Laterality: Right;  ? thumb nail surgery Right   ? ?Social History  ? ?Socioeconomic History  ? Marital status: Married  ?  Spouse name: Not on file  ? Number of children: 0  ? Years of education: Not on file  ? Highest education level: Not on file  ?Occupational History  ?  Comment: retired   ?Tobacco Use  ? Smoking status: Former  ? Smokeless tobacco: Never  ?Vaping Use  ? Vaping Use: Never used  ?Substance and Sexual Activity  ? Alcohol use: Yes  ?   Alcohol/week: 0.0 standard drinks  ?  Comment: rare  ? Drug use: No  ? Sexual activity: Not on file  ?Other Topics Concern  ? Not on file  ?Social History Narrative  ? Widowed  ? Remarried 2015 2016  ? HH of 2  ? Dog and Cat  ? 6-7 hours of sleep  ? Mom passed away 12/30/09  ? 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 Exercising   ? Neg tad   ocass etoh.  ? ?Social Determinants of Health  ? ?Financial Resource Strain: Low Risk   ? Difficulty of Paying Living Expenses: Not hard at all  ?Food Insecurity: No Food Insecurity  ? Worried About Charity fundraiser in the Last Year: Never true  ? Ran Out of Food in the Last Year: Never true  ?Transportation Needs: No Transportation Needs  ? Lack of Transportation (Medical): No  ? Lack of Transportation (Non-Medical): No  ?Physical Activity: Insufficiently Active  ? Days of Exercise per Week: 3 days  ? Minutes of Exercise per Session:  40 min  ?Stress: No Stress Concern Present  ? Feeling of Stress : Not at all  ?Social Connections: Moderately Integrated  ? Frequency of Communication with Friends and Family: More than three times a week  ? Frequency of Social Gatherings with Friends and Family: More than three times a week  ? Attends Religious Services: More than 4 times per year  ? Active Member of Clubs or Organizations: No  ? Attends Archivist Meetings: Never  ? Marital Status: Married  ?Intimate Partner Violence: Not At Risk  ? Fear of Current or Ex-Partner: No  ? Emotionally Abused: No  ? Physically Abused: No  ? Sexually Abused: No  ? ?Current Outpatient Medications on File Prior to Visit  ?Medication Sig Dispense Refill  ? aspirin 81 MG chewable tablet Chew 81 mg by mouth daily.    ? Cholecalciferol (VITAMIN D3) 400 UNITS CAPS Take 1 capsule by mouth daily.    ? fluocinonide (LIDEX) 0.05 % external solution fluocinonide 0.05 % topical solution ? APPLY 1 ML ON THE SKIN BID    ? hydrocortisone 2.5 % cream  hydrocortisone 2.5 % topical cream    ? losartan (COZAAR) 50 MG tablet TAKE 1 TABLET EVERY DAY 90 tablet 1  ? LYSINE PO Take by mouth as needed.     ? MULTIPLE VITAMIN PO Take by mouth.    ? Omega-3 Fatty Acids (FISH OI

## 2021-04-21 NOTE — Patient Instructions (Signed)
Please come back for a follow-up appointment in 1 year. ? ?Thyroid Nodule ?A thyroid nodule is an isolated growth of thyroid cells that forms a lump in your thyroid gland. The thyroid gland is a butterfly-shaped gland. It is found in the lower front of your neck. This gland sends chemical messengers (hormones) through your blood to all parts of your body. These hormones are important in regulating your body temperature and helping your body to use energy. ?Thyroid nodules are common. Most are not cancerous (benign). You may have one nodule or several nodules. ?Different types of thyroid nodules include nodules that: ?Grow and fill with fluid (thyroid cysts). ?Produce too much thyroid hormone (hot nodules or hyperthyroid). ?Produce no thyroid hormone (cold nodules or hypothyroid). ?Form from cancer cells (thyroid cancers). ?What are the causes? ?In most cases, the cause of this condition is not known. ?What increases the risk? ?The following factors may make you more likely to develop this condition. ?Age. Thyroid nodules become more common in people who are older than 78 years of age. ?Gender. ?Benign thyroid nodules are more common in women. ?Cancerous (malignant) thyroid nodules are more common in men. ?A family history that includes: ?Thyroid nodules. ?Pheochromocytoma. ?Thyroid carcinoma. ?Hyperparathyroidism. ?Certain kinds of thyroid diseases, such as Hashimoto's thyroiditis. ?Lack of iodine in your diet. ?A history of head and neck radiation, such as from previous cancer treatment. ?What are the signs or symptoms? ?In many cases, there are no symptoms. If you have symptoms, they may include: ?A lump in your lower neck. ?Feeling a lump or tickle in your throat. ?Pain in your neck, jaw, or ear. ?Having trouble swallowing. ?Hot nodules may cause symptoms that include: ?Weight loss. ?Warm, flushed skin. ?Feeling hot. ?Feeling nervous. ?A racing heartbeat. ?Cold nodules may cause symptoms that include: ?Weight  gain. ?Dry skin. ?Brittle hair. This may also occur with hair loss. ?Feeling cold. ?Fatigue. ?Thyroid cancer nodules may cause symptoms that include: ?Hard nodules that feel stuck to the thyroid gland. ?Hoarseness. ?Lumps in the glands near your thyroid (lymph nodes). ?How is this diagnosed? ?A thyroid nodule may be felt by your health care provider during a physical exam. This condition may also be diagnosed based on your symptoms. You may also have tests, including: ?An ultrasound. This may be done to confirm the diagnosis. ?A biopsy. This involves taking a sample from the nodule and looking at it under a microscope. ?Blood tests to make sure that your thyroid is working properly. ?A thyroid scan. This test uses a radioactive tracer injected into a vein to create an image of the thyroid gland on a computer screen. ?Imaging tests such as MRI or CT scan. These may be done if: ?Your nodule is large. ?Your nodule is blocking your airway. ?Cancer is suspected. ?How is this treated? ?Treatment depends on the cause and size of your nodule or nodules. If the nodule is benign, treatment may not be necessary. Your health care provider may monitor the nodule to see if it goes away without treatment. If the nodule continues to grow, is cancerous, or does not go away, treatment may be needed. Treatment may include: ?Having a cystic nodule drained with a needle. ?Ablation therapy. In this treatment, alcohol is injected into the area of the nodule to destroy the cells. Ablation with heat (thermal ablation) may also be used. ?Radioactive iodine. In this treatment, radioactive iodine is given as a pill or liquid that you drink. This substance causes the thyroid nodule to shrink. ?Surgery to  remove the nodule. Part or all of your thyroid gland may need to be removed as well. ?Medicines. ?Follow these instructions at home: ?Pay attention to any changes in your nodule. ?Take over-the-counter and prescription medicines only as told by  your health care provider. ?Keep all follow-up visits as told by your health care provider. This is important. ?Contact a health care provider if: ?Your voice changes. ?You have trouble swallowing. ?You have pain in your neck, ear, or jaw that is getting worse. ?Your nodule gets bigger. ?Your nodule starts to make it harder for you to breathe. ?Your muscles look like they are shrinking (muscle wasting). ?Get help right away if: ?You have chest pain. ?There is a loss of consciousness. ?You have a sudden fever. ?You feel confused. ?You are seeing or hearing things that other people do not see or hear (having hallucinations). ?You feel very weak. ?You have mood swings. ?You feel very restless. ?You feel suddenly nauseous or throw up. ?You suddenly have diarrhea. ?Summary ?A thyroid nodule is an isolated growth of thyroid cells that forms a lump in your thyroid gland. ?Thyroid nodules are common. Most are not cancerous (benign). You may have one nodule or several nodules. ?Treatment depends on the cause and size of your nodule or nodules. If the nodule is benign, treatment may not be necessary. ?Your health care provider may monitor the nodule to see if it goes away without treatment. If the nodule continues to grow, is cancerous, or does not go away, treatment may be needed. ?This information is not intended to replace advice given to you by your health care provider. Make sure you discuss any questions you have with your health care provider. ?Document Revised: 08/27/2017 Document Reviewed: 08/30/2017 ?Elsevier Patient Education ? [place]. ? ?

## 2021-04-29 DIAGNOSIS — Z85820 Personal history of malignant melanoma of skin: Secondary | ICD-10-CM | POA: Diagnosis not present

## 2021-04-29 DIAGNOSIS — L814 Other melanin hyperpigmentation: Secondary | ICD-10-CM | POA: Diagnosis not present

## 2021-04-29 DIAGNOSIS — L821 Other seborrheic keratosis: Secondary | ICD-10-CM | POA: Diagnosis not present

## 2021-04-29 DIAGNOSIS — L300 Nummular dermatitis: Secondary | ICD-10-CM | POA: Diagnosis not present

## 2021-04-29 DIAGNOSIS — L720 Epidermal cyst: Secondary | ICD-10-CM | POA: Diagnosis not present

## 2021-04-29 DIAGNOSIS — D485 Neoplasm of uncertain behavior of skin: Secondary | ICD-10-CM | POA: Diagnosis not present

## 2021-04-29 DIAGNOSIS — D2339 Other benign neoplasm of skin of other parts of face: Secondary | ICD-10-CM | POA: Diagnosis not present

## 2021-04-29 DIAGNOSIS — Z85828 Personal history of other malignant neoplasm of skin: Secondary | ICD-10-CM | POA: Diagnosis not present

## 2021-04-29 DIAGNOSIS — Z08 Encounter for follow-up examination after completed treatment for malignant neoplasm: Secondary | ICD-10-CM | POA: Diagnosis not present

## 2021-04-29 DIAGNOSIS — L820 Inflamed seborrheic keratosis: Secondary | ICD-10-CM | POA: Diagnosis not present

## 2021-05-06 DIAGNOSIS — Z0100 Encounter for examination of eyes and vision without abnormal findings: Secondary | ICD-10-CM | POA: Diagnosis not present

## 2021-05-16 ENCOUNTER — Ambulatory Visit (INDEPENDENT_AMBULATORY_CARE_PROVIDER_SITE_OTHER): Payer: Medicare HMO | Admitting: Family Medicine

## 2021-05-16 ENCOUNTER — Encounter: Payer: Self-pay | Admitting: Family Medicine

## 2021-05-16 VITALS — BP 120/70 | HR 85 | Temp 98.6°F | Resp 16 | Ht 63.78 in | Wt 141.0 lb

## 2021-05-16 DIAGNOSIS — J301 Allergic rhinitis due to pollen: Secondary | ICD-10-CM | POA: Diagnosis not present

## 2021-05-16 DIAGNOSIS — H9203 Otalgia, bilateral: Secondary | ICD-10-CM

## 2021-05-16 DIAGNOSIS — R059 Cough, unspecified: Secondary | ICD-10-CM | POA: Diagnosis not present

## 2021-05-16 DIAGNOSIS — J069 Acute upper respiratory infection, unspecified: Secondary | ICD-10-CM

## 2021-05-16 LAB — POCT RAPID STREP A (OFFICE): Rapid Strep A Screen: NEGATIVE

## 2021-05-16 LAB — POC COVID19 BINAXNOW: SARS Coronavirus 2 Ag: NEGATIVE

## 2021-05-16 MED ORDER — IPRATROPIUM BROMIDE 0.06 % NA SOLN: 2.0000 | Freq: Four times a day (QID) | NASAL | 1 refills | Status: DC

## 2021-05-16 MED ORDER — BENZONATATE 100 MG PO CAPS: 100.0000 mg | ORAL_CAPSULE | Freq: Two times a day (BID) | ORAL | 0 refills | Status: AC | PRN

## 2021-05-16 NOTE — Progress Notes (Signed)
? ? ?ACUTE VISIT ?Chief Complaint  ?Patient presents with  ? Cough  ?  Started Wednesday, no fever, glands are still swollen. Unsure if it's allergies.   ? Ear Pain  ? swollen gland  ? ?HPI: ?Sheila Livingston is a 76 y.o. female, who is here today complaining of 2-3 days of respiratory symptoms as described above. ?Yesterday she felt better but today she is having sore throat and earache again. ?Earache exacerbated by swallowing. ?Nasal congestion and post nasal drainage. ?Negative for anosmia and ageusia. ? ?Hx of hearing loss,wears hearing aids, no changes. ? ?URI  ?This is a new problem. There has been no fever. Associated symptoms include congestion, coughing, ear pain, a plugged ear sensation, rhinorrhea, a sore throat and swollen glands. Pertinent negatives include no abdominal pain, chest pain, diarrhea, dysuria, headaches, joint swelling, nausea, neck pain, rash, sinus pain, sneezing, vomiting or wheezing. She has tried acetaminophen for the symptoms. The treatment provided mild relief.  ?She had some chills initially. ?She took Tylenol yesterday. ?She feels like her glands are swollen, bilateral. ?Negative for abnormal wt loss, night sweats,and dysphagia. ?Hx of seasonal allergies. ? ?No sick contact or recent travel. ?Home COVID 19 test negative at home. ? ?Review of Systems  ?Constitutional:  Positive for fatigue. Negative for activity change.  ?HENT:  Positive for congestion, ear pain, rhinorrhea and sore throat. Negative for sinus pain and sneezing.   ?Eyes:  Negative for discharge and redness.  ?Respiratory:  Positive for cough. Negative for shortness of breath and wheezing.   ?Cardiovascular:  Negative for chest pain.  ?Gastrointestinal:  Negative for abdominal pain, diarrhea, nausea and vomiting.  ?Genitourinary:  Negative for decreased urine volume and dysuria.  ?Musculoskeletal:  Negative for neck pain.  ?Skin:  Negative for rash.  ?Neurological:  Negative for headaches.  ?Rest see pertinent  positives and negatives per HPI. ? ?Current Outpatient Medications on File Prior to Visit  ?Medication Sig Dispense Refill  ? aspirin 81 MG chewable tablet Chew 81 mg by mouth daily.    ? Cholecalciferol (VITAMIN D3) 400 UNITS CAPS Take 1 capsule by mouth daily.    ? docusate sodium (COLACE) 100 MG capsule Take 100 mg by mouth 2 (two) times daily.    ? fluocinonide (LIDEX) 0.05 % external solution fluocinonide 0.05 % topical solution ? APPLY 1 ML ON THE SKIN BID    ? hydrocortisone 2.5 % cream hydrocortisone 2.5 % topical cream    ? losartan (COZAAR) 50 MG tablet TAKE 1 TABLET EVERY DAY 90 tablet 1  ? LYSINE PO Take by mouth as needed.     ? MULTIPLE VITAMIN PO Take by mouth.    ? Omega-3 Fatty Acids (FISH OIL) 1000 MG CAPS     ? triamcinolone cream (KENALOG) 0.1 % Apply 1 application topically 2 (two) times daily.    ? TURMERIC PO Take 750 mg by mouth daily.    ? valACYclovir (VALTREX) 500 MG tablet Take 4 tablets (2,000 mg total) by mouth 2 (two) times daily. Prn cold sores 60 tablet 2  ? ?No current facility-administered medications on file prior to visit.  ? ?Past Medical History:  ?Diagnosis Date  ? Allergy   ? Anal fissure   ? Arthritis   ? hands  ? Benign paroxysmal positional vertigo   ? Complication of anesthesia   ? Endometriosis   ? Hemorrhoids   ? History of skin cancer   ? Hyperlipidemia   ? Hyperplastic colon polyp   ?  Hypertension   ? IBS (irritable bowel syndrome)   ? Melanoma ([place]) 06/10/2016  ? right thumb   ? Melanoma in situ ([place]) 05/31/2016  ? Personal history of COVID-19   ? PONV (postoperative nausea and vomiting)   ? ?Allergies  ?Allergen Reactions  ? Fluticasone Propionate Swelling and Other (See Comments)  ?  Nose bleed ?  ? Loperamide Other (See Comments)  ?  Passed out ?  ? Flonase [Fluticasone Propionate]   ? Fluticasone Propionate   ?  REACTION: Facial swelling under eyes nose bleeds  ? Loperamide Hcl   ?  REACTION: passed out  ? Loperamide Hcl   ? Other Other (See Comments)  ?  Hummus?  Swelling of face ?Hummus? Swelling of face  ? Benadryl [Diphenhydramine] Anxiety  ? Lisinopril Other (See Comments)  ?  REACTION: dizziness and light headed  ? ? ?Social History  ? ?Socioeconomic History  ? Marital status: Married  ?  Spouse name: Not on file  ? Number of children: 0  ? Years of education: Not on file  ? Highest education level: Not on file  ?Occupational History  ?  Comment: retired   ?Tobacco Use  ? Smoking status: Former  ? Smokeless tobacco: Never  ?Vaping Use  ? Vaping Use: Never used  ?Substance and Sexual Activity  ? Alcohol use: Yes  ?  Alcohol/week: 0.0 standard drinks  ?  Comment: rare  ? Drug use: No  ? Sexual activity: Not on file  ?Other Topics Concern  ? Not on file  ?Social History Narrative  ? Widowed  ? Remarried 2015 2016  ? HH of 2  ? Dog and Cat  ? 6-7 hours of sleep  ? Mom passed away 12/15/2009  ? Working [employer]  56 - 82  retired December 2014 ocass work call back now 2016 retired   ? Exercising   ? Neg tad   ocass etoh.  ? ?Social Determinants of Health  ? ?Financial Resource Strain: Low Risk   ? Difficulty of Paying Living Expenses: Not hard at all  ?Food Insecurity: No Food Insecurity  ? Worried About Charity fundraiser in the Last Year: Never true  ? Ran Out of Food in the Last Year: Never true  ?Transportation Needs: No Transportation Needs  ? Lack of Transportation (Medical): No  ? Lack of Transportation (Non-Medical): No  ?Physical Activity: Insufficiently Active  ? Days of Exercise per Week: 3 days  ? Minutes of Exercise per Session: 40 min  ?Stress: No Stress Concern Present  ? Feeling of Stress : Not at all  ?Social Connections: Moderately Integrated  ? Frequency of Communication with Friends and Family: More than three times a week  ? Frequency of Social Gatherings with Friends and Family: More than three times a week  ? Attends Religious Services: More than 4 times per year  ? Active Member of Clubs or Organizations: No  ? Attends Archivist  Meetings: Never  ? Marital Status: Married  ? ? ?Vitals:  ? 05/16/21 1456  ?BP: 120/70  ?Pulse: 85  ?Resp: 16  ?Temp: 98.6 ?F (37 ?C)  ?SpO2: 99%  ? ?Body mass index is 24.37 kg/m?. ? ?Physical Exam ?Vitals and nursing note reviewed.  ?Constitutional:   ?   General: She is not in acute distress. ?   Appearance: She is well-developed. She is not ill-appearing.  ?HENT:  ?   Head: Normocephalic and atraumatic.  ?  Right Ear: Tympanic membrane, ear canal and external ear normal.  ?   Left Ear: Tympanic membrane, ear canal and external ear normal.  ?   Ears:  ?   Comments: Hearing aid, bilateral. ?   Nose: Septal deviation, congestion and rhinorrhea (Clear) present.  ?   Right Turbinates: Not enlarged.  ?   Left Turbinates: Not enlarged.  ?   Mouth/Throat:  ?   Mouth: Mucous membranes are moist.  ?   Pharynx: Posterior oropharyngeal erythema (Mild.) present. No pharyngeal swelling, oropharyngeal exudate or uvula swelling.  ?Eyes:  ?   Conjunctiva/sclera: Conjunctivae normal.  ?Cardiovascular:  ?   Rate and Rhythm: Normal rate and regular rhythm.  ?   Heart sounds: No murmur heard. ?Pulmonary:  ?   Effort: Pulmonary effort is normal. No respiratory distress.  ?   Breath sounds: Normal breath sounds. No stridor.  ?Musculoskeletal:  ?   Cervical back: No edema or erythema. No muscular tenderness.  ?Lymphadenopathy:  ?   Head:  ?   Right side of head: No submandibular adenopathy.  ?   Left side of head: No submandibular adenopathy.  ?   Cervical: Cervical adenopathy (Posterior cervfical, < 1 cm, bilateral) present.  ?Skin: ?   General: Skin is warm.  ?   Findings: No erythema or rash.  ?Neurological:  ?   Mental Status: She is alert and oriented to person, place, and time.  ?Psychiatric:     ?   Speech: Speech normal.  ?   Comments: Well groomed, good eye contact.  ? ?ASSESSMENT AND PLAN: ? ?Ms. Alanni was seen today for cough, ear pain and swollen gland. ? ?Diagnoses and all orders for this visit: ?Orders Placed This  Encounter  ?Procedures  ? [place] COVID-19  ? POC Rapid Strep A  ? ? ?URI, acute ?Symptoms suggests a viral etiology, I explained patient that symptomatic treatment is usually recommended in this case, so I do not think abx is

## 2021-05-16 NOTE — Patient Instructions (Signed)
A few things to remember from today's visit: ? ? ?URI, acute ? ?Cough, unspecified type - Plan: POC COVID-19, POC Rapid Strep A, benzonatate (TESSALON) 100 MG capsule ? ?Seasonal allergic rhinitis due to pollen - Plan: ipratropium (ATROVENT) 0.06 % nasal spray ? ?If you need refills please call your pharmacy. ?Do not use My Chart to request refills or for acute issues that need immediate attention. ?  ? ?Please be sure medication list is accurate. ?If a new problem present, please set up appointment sooner than planned today. ? ?viral infections are self-limited and we treat each symptom depending of severity.  ?Over the counter medications as decongestants and cold medications usually help, they need to be taken with caution if there is a history of high blood pressure or palpitations. ?Tylenol and/or Ibuprofen also helps with most symptoms (headache, muscle aching, fever,etc) ?Plenty of fluids. ?Honey helps with cough. Benzonatate sent to also help with cough. ? ?Steam inhalations helps with runny nose, nasal congestion, and may prevent sinus infections. ?Cough and nasal congestion could last a few days and sometimes weeks. ?Please follow in not any better in 1-2 weeks or if symptoms get worse. ? ?When cough is more productive you cna try plain Mucinex. ? ? ? ? ? ?

## 2021-05-23 ENCOUNTER — Telehealth: Payer: Self-pay

## 2021-05-23 ENCOUNTER — Ambulatory Visit
Admission: EM | Admit: 2021-05-23 | Discharge: 2021-05-23 | Disposition: A | Discharge status: Home / Self Care | Payer: Medicare HMO | Attending: Emergency Medicine | Admitting: Emergency Medicine

## 2021-05-23 DIAGNOSIS — H1013 Acute atopic conjunctivitis, bilateral: Secondary | ICD-10-CM | POA: Diagnosis not present

## 2021-05-23 DIAGNOSIS — J302 Other seasonal allergic rhinitis: Secondary | ICD-10-CM | POA: Diagnosis not present

## 2021-05-23 MED ORDER — OLOPATADINE HCL 0.2 % OP SOLN: 1.0000 [drp] | Freq: Every day | OPHTHALMIC | 1 refills | Status: DC

## 2021-05-23 MED ORDER — FLUNISOLIDE 25 MCG/ACT (0.025%) NA SOLN: 2.0000 | Freq: Two times a day (BID) | NASAL | 0 refills | Status: DC

## 2021-05-23 MED ORDER — OLOPATADINE HCL 0.2 % OP SOLN
1.0000 [drp] | Freq: Every day | OPHTHALMIC | 1 refills | Status: DC
Start: 2021-05-23 — End: 2021-05-23

## 2021-05-23 NOTE — Discharge Instructions (Signed)
Please begin Pataday, and antihistamine drop but is used daily by applying 1 drop to each eye. ? ?Please begin flunisolide nasal spray which is a steroid nasal spray.  This steroid is similar to the fluocinonide solution that you are currently using and we will not cause an allergic reaction similar to the reaction that you have to fluticasone.  Please spray 2 sprays of flunisolide in each side of your nose twice daily.  You may continue using ipratropium nasal spray as well. ? ?Thank you for visiting urgent care today. ?

## 2021-05-23 NOTE — ED Provider Notes (Signed)
?[facility] ? ? ? ?CSN: [id number] ?Arrival date & time: 05/23/21  1552 ?  ? ?HISTORY  ? ?Chief Complaint  ?Patient presents with  ? Conjunctivitis  ? ?HPI ?Sheila Livingston is a 76 y.o. female. Patient presents urgent care complaining of waking up early this morning, over 8 hours ago, with some crusting and dryness in her eyes.  Patient states she has not had any continued crusting throughout the day nor has she had any thick purulent discharge from her eyes.  Patient states she was seen about a week ago for a cold and seasonal allergies, states she was provided with a prescription for nasal spray that she thought was for her allergies.  Patient states she does not take any allergy medications otherwise.  Patient states she applied a warm compress to her left eye once today but did not feel that she had any meaningful improvement. ? ?The history is provided by the patient.  ?Past Medical History:  ?Diagnosis Date  ? Allergy   ? Anal fissure   ? Arthritis   ? hands  ? Benign paroxysmal positional vertigo   ? Complication of anesthesia   ? Endometriosis   ? Hemorrhoids   ? History of skin cancer   ? Hyperlipidemia   ? Hyperplastic colon polyp   ? Hypertension   ? IBS (irritable bowel syndrome)   ? Melanoma ([place]) 06/10/2016  ? right thumb   ? Melanoma in situ ([place]) 05/31/2016  ? Personal history of COVID-19   ? PONV (postoperative nausea and vomiting)   ? ?Patient Active Problem List  ? Diagnosis Date Noted  ? Recurrent herpes labialis 03/25/2018  ? History of melanoma in situ 02/03/2018  ? Metatarsal deformity 08/29/2014  ? Metatarsalgia of right foot 08/29/2014  ? Medication management 10/27/2013  ? Hyperlipidemia 10/27/2013  ? Dermatitis 01/25/2013  ? Visit for preventive health examination 01/25/2013  ? Anogenital lichen sclerosus 23/53/6144  ? Hyperkalemia 01/23/2011  ? ENDOMETRIOSIS 03/24/2010  ? HYPERLIPIDEMIA 01/13/2010  ? BUNIONS, LEFT FOOT 09/19/2008  ? NECK PAIN 03/21/2008  ? SYMPTOMATIC  MENOPAUSAL/FEMALE CLIMACTERIC STATES 12/28/2007  ? RECTAL BLEEDING 09/19/2007  ? ALLERGIC RHINITIS, SEASONAL 05/19/2007  ? HYPERTRIGLYCERIDEMIA 12/22/2006  ? COLONIC POLYPS, HX OF 12/22/2006  ? Essential hypertension 10/29/2006  ? ?Past Surgical History:  ?Procedure Laterality Date  ? ABDOMINAL HYSTERECTOMY    ? bso enometriosis; no cancer  ? AMPUTATION Right 06/02/2016  ? Procedure: AMPUTATION RIGHT THUMB INTERPHALANGEAL JOINT;  Surgeon: Daryll Brod, MD;  Location: [facility];  Service: Orthopedics;  Laterality: Right;  ? BUNIONECTOMY Left 11/23/2008  ? NAILBED REPAIR Right 05/12/2016  ? Procedure: EXCISION OF NAIL MATRIX BED RIGHT;  Surgeon: Daryll Brod, MD;  Location: [facility];  Service: Orthopedics;  Laterality: Right;  ? ROTATOR CUFF REPAIR Right 10/2009  ? SKIN BIOPSY  03/16/2017  ? shave biopsy, right inferior helix, chondrodermatits nodularis helicis  ? SKIN FULL THICKNESS GRAFT Right 05/12/2016  ? Procedure: UPPER ARM SKIN GRAFT RIGHT THUMB;  Surgeon: Daryll Brod, MD;  Location: [facility];  Service: Orthopedics;  Laterality: Right;  ? thumb nail surgery Right   ? ?OB History   ?No obstetric history on file. ?  ? ?Home Medications   ? ?Prior to Admission medications   ?Medication Sig Start Date End Date Taking? Authorizing Provider  ?aspirin 81 MG chewable tablet Chew 81 mg by mouth daily.    [provider]  ?benzonatate (TESSALON) 100 MG capsule  Take 1 capsule (100 mg total) by mouth 2 (two) times daily as needed for up to 10 days. 05/16/21 05/26/21  [place], Betty G, MD  ?Cholecalciferol (VITAMIN D3) 400 UNITS CAPS Take 1 capsule by mouth daily.    [provider]  ?docusate sodium (COLACE) 100 MG capsule Take 100 mg by mouth 2 (two) times daily.    [provider]  ?fluocinonide (LIDEX) 0.05 % external solution fluocinonide 0.05 % topical solution ? APPLY 1 ML ON THE SKIN BID    [provider]  ?hydrocortisone 2.5 % cream  hydrocortisone 2.5 % topical cream    [provider]  ?ipratropium (ATROVENT) 0.06 % nasal spray Place 2 sprays into both nostrils 4 (four) times daily. 05/16/21 07/15/21  [place]ique, Betty G, MD  ?losartan (COZAAR) 50 MG tablet TAKE 1 TABLET EVERY DAY 09/23/20   Panosh, Standley Brooking, MD  ?LYSINE PO Take by mouth as needed.     [provider]  ?MULTIPLE VITAMIN PO Take by mouth.    [provider]  ?Omega-3 Fatty Acids (FISH OIL) 1000 MG CAPS  05/27/19   [provider]  ?triamcinolone cream (KENALOG) 0.1 % Apply 1 application topically 2 (two) times daily.    [provider]  ?TURMERIC PO Take 750 mg by mouth daily.    [provider]  ?valACYclovir (VALTREX) 500 MG tablet Take 4 tablets (2,000 mg total) by mouth 2 (two) times daily. Prn cold sores 02/06/20   Panosh, Standley Brooking, MD  ? ?Family History ?Family History  ?Problem Relation Age of Onset  ? Diabetes Mother   ? Stroke Mother   ? Breast cancer Sister   ? Colon polyps Sister   ? Diabetes Sister   ? Colon polyps Sister   ? Colon cancer Neg Hx   ? ?Social History ?Social History  ? ?Tobacco Use  ? Smoking status: Former  ? Smokeless tobacco: Never  ?Vaping Use  ? Vaping Use: Never used  ?Substance Use Topics  ? Alcohol use: Yes  ?  Alcohol/week: 0.0 standard drinks  ?  Comment: rare  ? Drug use: No  ? ?Allergies   ?Fluticasone propionate, Loperamide, Flonase [fluticasone propionate], Fluticasone propionate, Loperamide hcl, Loperamide hcl, Other, Benadryl [diphenhydramine], and Lisinopril ? ?Review of Systems ?Review of Systems ?Pertinent findings noted in history of present illness.  ? ?Physical Exam ?Triage Vital Signs ?ED Triage Vitals  ?Enc Vitals Group  ?   BP 11/22/20 0827 (!) 147/82  ?   Pulse Rate 11/22/20 0827 72  ?   Resp 11/22/20 0827 18  ?   Temp 11/22/20 0827 98.3 ?F (36.8 ?C)  ?   Temp Source 11/22/20 0827 Oral  ?   SpO2 11/22/20 0827 98 %  ?   Weight --   ?   Height --   ?   Head Circumference --   ?   Peak  Flow --   ?   Pain Score 11/22/20 0826 5  ?   Pain Loc --   ?   Pain Edu? --   ?   Excl. in [place]? --   ?No data found. ? ?Updated Vital Signs ?BP (!) 153/79 (BP Location: Right Arm)   Pulse 78   Temp 98.4 ?F (36.9 ?C) (Oral)   Resp 18   SpO2 97%  ? ?Physical Exam ?Vitals and nursing note reviewed.  ?Constitutional:   ?   General: She is not in acute distress. ?   Appearance: Normal  appearance. She is not ill-appearing.  ?HENT:  ?   Head: Normocephalic and atraumatic.  ?   Salivary Glands: Right salivary gland is not diffusely enlarged or tender. Left salivary gland is not diffusely enlarged or tender.  ?   Right Ear: Ear canal and external ear normal. No drainage. A middle ear effusion is present. There is no impacted cerumen. Tympanic membrane is bulging. Tympanic membrane is not injected or erythematous.  ?   Left Ear: Ear canal and external ear normal. No drainage. A middle ear effusion is present. There is no impacted cerumen. Tympanic membrane is bulging. Tympanic membrane is not injected or erythematous.  ?   Ears:  ?   Comments: Bilateral EACs normal, both TMs bulging with clear fluid ?   Nose: Rhinorrhea present. No nasal deformity, septal deviation, signs of injury, nasal tenderness, mucosal edema or congestion. Rhinorrhea is clear.  ?   Right Nostril: Occlusion present. No foreign body, epistaxis or septal hematoma.  ?   Left Nostril: Occlusion present. No foreign body, epistaxis or septal hematoma.  ?   Right Turbinates: Enlarged, swollen and pale.  ?   Left Turbinates: Enlarged, swollen and pale.  ?   Right Sinus: No maxillary sinus tenderness or frontal sinus tenderness.  ?   Left Sinus: No maxillary sinus tenderness or frontal sinus tenderness.  ?   Mouth/Throat:  ?   Lips: Pink. No lesions.  ?   Mouth: Mucous membranes are moist. No oral lesions.  ?   Pharynx: Oropharynx is clear. Uvula midline. No posterior oropharyngeal erythema or uvula swelling.  ?   Tonsils: No tonsillar exudate. 0 on the  right. 0 on the left.  ?   Comments: Postnasal drip ?Eyes:  ?   General: Lids are normal. Lids are everted, no foreign bodies appreciated. Vision grossly intact. Gaze aligned appropriately.     ?   Right eye: No foreign body, discha

## 2021-05-23 NOTE — ED Triage Notes (Signed)
Triaged by provider  

## 2021-05-27 ENCOUNTER — Ambulatory Visit (INDEPENDENT_AMBULATORY_CARE_PROVIDER_SITE_OTHER): Payer: Medicare HMO | Admitting: Internal Medicine

## 2021-05-27 ENCOUNTER — Encounter: Payer: Self-pay | Admitting: Internal Medicine

## 2021-05-27 ENCOUNTER — Ambulatory Visit (INDEPENDENT_AMBULATORY_CARE_PROVIDER_SITE_OTHER): Payer: Medicare HMO

## 2021-05-27 VITALS — BP 134/80 | HR 69 | Temp 98.0°F | Ht 63.0 in | Wt 140.0 lb

## 2021-05-27 DIAGNOSIS — R058 Other specified cough: Secondary | ICD-10-CM | POA: Diagnosis not present

## 2021-05-27 DIAGNOSIS — R5383 Other fatigue: Secondary | ICD-10-CM

## 2021-05-27 DIAGNOSIS — R059 Cough, unspecified: Secondary | ICD-10-CM | POA: Diagnosis not present

## 2021-05-27 DIAGNOSIS — J029 Acute pharyngitis, unspecified: Secondary | ICD-10-CM | POA: Diagnosis not present

## 2021-05-27 DIAGNOSIS — R590 Localized enlarged lymph nodes: Secondary | ICD-10-CM

## 2021-05-27 LAB — CBC WITH DIFFERENTIAL/PLATELET
Basophils Absolute: 0 10*3/uL (ref 0.0–0.1)
Basophils Relative: 0.2 % (ref 0.0–3.0)
Eosinophils Absolute: 0.2 10*3/uL (ref 0.0–0.7)
Eosinophils Relative: 1.5 % (ref 0.0–5.0)
HCT: 36.3 % (ref 36.0–46.0)
Hemoglobin: 12.6 g/dL (ref 12.0–15.0)
Lymphocytes Relative: 23.9 % (ref 12.0–46.0)
Lymphs Abs: 2.5 10*3/uL (ref 0.7–4.0)
MCHC: 34.6 g/dL (ref 30.0–36.0)
MCV: 91.1 fl (ref 78.0–100.0)
Monocytes Absolute: 0.7 10*3/uL (ref 0.1–1.0)
Monocytes Relative: 7 % (ref 3.0–12.0)
Neutro Abs: 7.1 10*3/uL (ref 1.4–7.7)
Neutrophils Relative %: 67.4 % (ref 43.0–77.0)
Platelets: 308 10*3/uL (ref 150.0–400.0)
RBC: 3.99 Mil/uL (ref 3.87–5.11)
RDW: 13.2 % (ref 11.5–15.5)
WBC: 10.6 10*3/uL — ABNORMAL HIGH (ref 4.0–10.5)

## 2021-05-27 LAB — C-REACTIVE PROTEIN: CRP: 2.5 mg/dL (ref 0.5–20.0)

## 2021-05-27 LAB — SEDIMENTATION RATE: Sed Rate: 40 mm/hr — ABNORMAL HIGH (ref 0–30)

## 2021-05-27 NOTE — Patient Instructions (Signed)
This could be   a prolonged recovery phase  of viral resp infection such as adenovirus  in addition to  allergy  exposures sx .  ? ?Exam is reassuring  but  lab and x ray today .  ? ?If all ok then   time and observation to continue.  ?

## 2021-05-27 NOTE — Progress Notes (Signed)
? ?Chief Complaint  ?Patient presents with  ? Follow-up  ?  Glands still feel swollen  ? ? ?HPI: ?Sheila Livingston 76 y.o. come in for  evolving ongoing sx.  ?Initially very sore throat.  And then visits as below  ?Ed visit  4 28 allergic rhinitis Conjunctivitis  ?Saw Dr Martinique APril 21  for uri  had pc  1 cm  ?Has recent  eval for multipl thyroid nodules Dr Cruzita Lederer stable and to be seen yearly.  ? ?Got pink eye  felt to be allergic given pataday ipratroprian  and flunislide  ?Cough is better  anterir  glands still tender when swallows  no progression ?No fever   no exposure .  Except ? [employer].  ?ROS: See pertinent positives and negatives per HPI. Tired but no fever  sinus pain  has a cough at times  mid upper chest  but no sob  ? ?Past Medical History:  ?Diagnosis Date  ? Allergy   ? Anal fissure   ? Arthritis   ? hands  ? Benign paroxysmal positional vertigo   ? Complication of anesthesia   ? Endometriosis   ? Hemorrhoids   ? History of skin cancer   ? Hyperlipidemia   ? Hyperplastic colon polyp   ? Hypertension   ? IBS (irritable bowel syndrome)   ? Melanoma ([place]) 06/10/2016  ? right thumb   ? Melanoma in situ ([place]) 05/31/2016  ? Personal history of COVID-19   ? PONV (postoperative nausea and vomiting)   ? ? ?Family History  ?Problem Relation Age of Onset  ? Diabetes Mother   ? Stroke Mother   ? Breast cancer Sister   ? Colon polyps Sister   ? Diabetes Sister   ? Colon polyps Sister   ? Colon cancer Neg Hx   ? ? ?Social History  ? ?Socioeconomic History  ? Marital status: Married  ?  Spouse name: Not on file  ? Number of children: 0  ? Years of education: Not on file  ? Highest education level: Not on file  ?Occupational History  ?  Comment: retired   ?Tobacco Use  ? Smoking status: Former  ? Smokeless tobacco: Never  ?Vaping Use  ? Vaping Use: Never used  ?Substance and Sexual Activity  ? Alcohol use: Yes  ?  Alcohol/week: 0.0 standard drinks  ?  Comment: rare  ? Drug use: No  ? Sexual activity: Not on file  ?Other  Topics Concern  ? Not on file  ?Social History Narrative  ? Widowed  ? Remarried 2015 2016  ? HH of 2  ? Dog and Cat  ? 6-7 hours of sleep  ? Mom passed away December 13, 2009  ? 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 Exercising   ? Neg tad   ocass etoh.  ? ?Social Determinants of Health  ? ?Financial Resource Strain: Low Risk   ? Difficulty of Paying Living Expenses: Not hard at all  ?Food Insecurity: No Food Insecurity  ? Worried About Charity fundraiser in the Last Year: Never true  ? Ran Out of Food in the Last Year: Never true  ?Transportation Needs: No Transportation Needs  ? Lack of Transportation (Medical): No  ? Lack of Transportation (Non-Medical): No  ?Physical Activity: Insufficiently Active  ? Days of Exercise per Week: 3 days  ? Minutes of Exercise per Session: 40 min  ?  Stress: No Stress Concern Present  ? Feeling of Stress : Not at all  ?Social Connections: Moderately Integrated  ? Frequency of Communication with Friends and Family: More than three times a week  ? Frequency of Social Gatherings with Friends and Family: More than three times a week  ? Attends Religious Services: More than 4 times per year  ? Active Member of Clubs or Organizations: No  ? Attends Archivist Meetings: Never  ? Marital Status: Married  ? ? ?Outpatient Medications Prior to Visit  ?Medication Sig Dispense Refill  ? aspirin 81 MG chewable tablet Chew 81 mg by mouth daily.    ? Cholecalciferol (VITAMIN D3) 400 UNITS CAPS Take 1 capsule by mouth daily.    ? docusate sodium (COLACE) 100 MG capsule Take 100 mg by mouth 2 (two) times daily.    ? flunisolide (NASALIDE) 25 MCG/ACT (0.025%) SOLN Place 2 sprays into the nose 2 (two) times daily. 25 mL 0  ? fluocinonide (LIDEX) 0.05 % external solution fluocinonide 0.05 % topical solution ? APPLY 1 ML ON THE SKIN BID    ? hydrocortisone 2.5 % cream hydrocortisone 2.5 % topical cream    ? ipratropium (ATROVENT) 0.06 % nasal  spray Place 2 sprays into both nostrils 4 (four) times daily. 15 mL 1  ? losartan (COZAAR) 50 MG tablet TAKE 1 TABLET EVERY DAY 90 tablet 1  ? LYSINE PO Take by mouth as needed.     ? MULTIPLE VITAMIN PO Take by mouth.    ? Olopatadine HCl (PATADAY) 0.2 % SOLN Apply 1 drop to eye daily. 2.5 mL 1  ? Omega-3 Fatty Acids (FISH OIL) 1000 MG CAPS     ? triamcinolone cream (KENALOG) 0.1 % Apply 1 application topically 2 (two) times daily.    ? TURMERIC PO Take 750 mg by mouth daily.    ? valACYclovir (VALTREX) 500 MG tablet Take 4 tablets (2,000 mg total) by mouth 2 (two) times daily. Prn cold sores 60 tablet 2  ? ?No facility-administered medications prior to visit.  ? ? ? ?EXAM: ? ?BP 134/80 (BP Location: Left Arm, Patient Position: Sitting, Cuff Size: Normal)   Pulse 69   Temp 98 ?F (36.7 ?C) (Oral)   Ht '5\' 3"'$  (1.6 m)   Wt 140 lb (63.5 kg)   SpO2 99%   BMI 24.80 kg/m?  ? ?Body mass index is 24.8 kg/m?. ? ?GENERAL: vitals reviewed and listed above, alert, oriented, appears well hydrated and in no acute distress ?HEENT: atraumatic, conjunctiva minimal redness  lpalpebral conjunctiva , no obvious abnormalities on inspection of external nose and ears OP : no lesion edema or exudate  congested no sinus tenderness  ?NECK: no obvious masses on inspection palpation  shoddy ac pc area noteds  thyroid palp but no sig enlargement of ln  ?LUNGS: clear to auscultation bilaterally, no wheezes, rales or rhonchi, good air movement ocass cough ?CV: HRRR, no clubbing cyanosis or  peripheral edema nl cap refill  ?MS: moves all extremities without noticeable focal  abnormality ?PSYCH: pleasant and cooperative, no obvious depression or anxiety ? ?BP Readings from Last 3 Encounters:  ?05/27/21 134/80  ?05/23/21 (!) 153/79  ?05/16/21 120/70  ? ? ?ASSESSMENT AND PLAN: ? ?Discussed the following assessment and plan: ? ?Sore throat - Plan: CBC with Differential/Platelet, C-reactive protein, Sedimentation rate, Sedimentation rate,  C-reactive protein, CBC with Differential/Platelet ? ?Reactive cervical lymphadenopathy - Plan: CBC with Differential/Platelet, C-reactive protein, Sedimentation rate, Sedimentation rate, C-reactive protein,  CBC with Differential/Platelet ? ?Other fatigue - Plan: CBC with Differential/Platelet, C-reactive protein, Sedimentation rate, DG Chest 2 View, Sedimentation rate, C-reactive protein, CBC with Differential/Platelet ? ?Respiratory tract congestion with cough - Plan: CBC with Differential/Platelet, C-reactive protein, Sedimentation rate, DG Chest 2 View, Sedimentation rate, C-reactive protein, CBC with Differential/Platelet ?Overall seems like a prolonged evolving respiratory infection such as adenovirus however some of the nasal eye symptoms could be allergic.  No evidence of bacterial secondary infection at this time. ?Fortunately exam today is reassuring but does have some residual congestion. ?Update CBC inflammatory markers chest x-ray if all okay or unremarkable plan continued recovery over the next couple weeks. ?Can continue allergy medicine control otherwise if helpful. ?-Patient advised to return or notify health care team  if  new concerns arise. ?Record review ED urgent care visit and Dr. Doug Sou visit lab work counseling exam and plan 30 minutes ?Patient Instructions  ?This could be   a prolonged recovery phase  of viral resp infection such as adenovirus  in addition to  allergy  exposures sx .  ? ?Exam is reassuring  but  lab and x ray today .  ? ?If all ok then   time and observation to continue.  ? ? ?Standley Brooking. Jahleel Stroschein [name] ?

## 2021-05-28 ENCOUNTER — Encounter: Payer: Self-pay | Admitting: Internal Medicine

## 2021-05-28 ENCOUNTER — Other Ambulatory Visit: Payer: Self-pay | Admitting: Internal Medicine

## 2021-05-28 MED ORDER — DOXYCYCLINE HYCLATE 100 MG PO TABS: 100.0000 mg | ORAL_TABLET | Freq: Two times a day (BID) | ORAL | 0 refills | Status: DC

## 2021-05-28 NOTE — Progress Notes (Signed)
So x-ray does show a mild pneumonia on the right side.  I am sending antibiotic doxycycline 100 mg p.o. twice daily x7 days to your pharmacy( [facility]) ?Make follow-up appointment in 2 to 3 weeks   for follow up  or as needed . ?Blood coutn shows mild elevation of WBC consistent with infection.  No alarming findings

## 2021-05-29 NOTE — Progress Notes (Signed)
Yes   go ahead as  long as improving

## 2021-06-15 NOTE — Progress Notes (Signed)
Chief Complaint  Patient presents with   Follow-up    HPI: Sheila Livingston 76 y.o. come in for fu pna   noted  '5 3 23  '$ rx with doxycycline Got  better by end of antibiotic  Cough only  in am  not as dry .  A different cough and not related  Allergy sx  fluticasone still stings  off and on ur allergy sx  Has irritated itchy scar rupp back derm given her steroid  ROS: See pertinent positives and negatives per HPI.  Past Medical History:  Diagnosis Date   Allergy    Anal fissure    Arthritis    hands   Benign paroxysmal positional vertigo    Complication of anesthesia    Endometriosis    Hemorrhoids    History of skin cancer    Hyperlipidemia    Hyperplastic colon polyp    Hypertension    IBS (irritable bowel syndrome)    Melanoma ([place]) 06/10/2016   right thumb    Melanoma in situ ([place]) 05/31/2016   Personal history of COVID-19    PONV (postoperative nausea and vomiting)     Family History  Problem Relation Age of Onset   Diabetes Mother    Stroke Mother    Breast cancer Sister    Colon polyps Sister    Diabetes Sister    Colon polyps Sister    Colon cancer Neg Hx     Social History   Socioeconomic History   Marital status: Married    Spouse name: Not on file   Number of children: 0   Years of education: Not on file   Highest education level: Not on file  Occupational History    Comment: retired   Tobacco Use   Smoking status: Former   Smokeless tobacco: Never  Scientific laboratory technician Use: Never used  Substance and Sexual Activity   Alcohol use: Yes    Alcohol/week: 0.0 standard drinks    Comment: rare   Drug use: No   Sexual activity: Not on file  Other Topics Concern   Not on file  Social History Narrative   Widowed   Remarried 2015 2016   [facility] of 2   Dog and Cat   6-7 hours of sleep   Mom passed away 13-Dec-2009   Working [employer]  [employer] - [employer]  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    Social Determinants of Health   Financial Resource Strain: Low Risk    Difficulty of Paying Living Expenses: Not hard at all  Food Insecurity: No Food Insecurity   Worried About Charity fundraiser in the Last Year: Never true   [place] in the Last Year: Never true  Transportation Needs: No Transportation Needs   Lack of Transportation (Medical): No   Lack of Transportation (Non-Medical): No  Physical Activity: Insufficiently Active   Days of Exercise per Week: 3 days   Minutes of Exercise per Session: 40 min  Stress: No Stress Concern Present   Feeling of Stress : Not at all  Social Connections: Moderately Integrated   Frequency of Communication with Friends and Family: More than three times a week   Frequency of Social Gatherings with Friends and Family: More than three times a week   Attends Religious Services: More than 4 times per year  Active Member of Clubs or Organizations: No   Attends Archivist Meetings: Never   Marital Status: Married    Outpatient Medications Prior to Visit  Medication Sig Dispense Refill   aspirin 81 MG chewable tablet Chew 81 mg by mouth daily.     Cholecalciferol (VITAMIN D3) 400 UNITS CAPS Take 1 capsule by mouth daily.     docusate sodium (COLACE) 100 MG capsule Take 100 mg by mouth 2 (two) times daily.     flunisolide (NASALIDE) 25 MCG/ACT (0.025%) SOLN Place 2 sprays into the nose 2 (two) times daily. 25 mL 0   fluocinonide (LIDEX) 0.05 % external solution fluocinonide 0.05 % topical solution  APPLY 1 ML ON THE SKIN BID     hydrocortisone 2.5 % cream hydrocortisone 2.5 % topical cream     ipratropium (ATROVENT) 0.06 % nasal spray Place 2 sprays into both nostrils 4 (four) times daily. 15 mL 1   losartan (COZAAR) 50 MG tablet TAKE 1 TABLET EVERY DAY 90 tablet 1   LYSINE PO Take by mouth as needed.      MULTIPLE VITAMIN PO Take by mouth.     Olopatadine HCl (PATADAY) 0.2 % SOLN Apply 1 drop to eye daily. 2.5 mL 1    Omega-3 Fatty Acids (FISH OIL) 1000 MG CAPS      triamcinolone cream (KENALOG) 0.1 % Apply 1 application topically 2 (two) times daily.     TURMERIC PO Take 750 mg by mouth daily.     valACYclovir (VALTREX) 500 MG tablet Take 4 tablets (2,000 mg total) by mouth 2 (two) times daily. Prn cold sores 60 tablet 2   doxycycline (VIBRA-TABS) 100 MG tablet Take 1 tablet (100 mg total) by mouth 2 (two) times daily. For pneumoina (Patient not taking: Reported on 06/16/2021) 14 tablet 0   No facility-administered medications prior to visit.     EXAM:  BP 120/70 (BP Location: Left Arm, Patient Position: Sitting, Cuff Size: Normal)   Pulse 75   Temp 98.6 F (37 C) (Oral)   Ht '5\' 3"'$  (1.6 m)   Wt 141 lb 12.8 oz (64.3 kg)   SpO2 99%   BMI 25.12 kg/m   Body mass index is 25.12 kg/m.  GENERAL: vitals reviewed and listed above, alert, oriented, appears well hydrated and in no acute distress HEENT: atraumatic, conjunctiva  clear, no obvious abnormalities on inspection of external nose and ears  NECK: no obvious masses on inspection palpation  LUNGS: clear to auscultation bilaterally, no wheezes, rales or rhonchi, good air movement CV: HRRR, no clubbing cyanosis or  peripheral edema nl cap refill  Skin keloid scar liniearirritated right upper back  PSYCH: pleasant and cooperative, no obvious depression or anxiety  BP Readings from Last 3 Encounters:  06/16/21 120/70  05/27/21 134/80  05/23/21 (!) 153/79   IMPRESSION: Mild infiltrate noted the right upper lung suggesting pneumonia. Follow-up exam suggested to demonstrate clearing. Mild right base subsegmental atelectasis.     Electronically Signed   By: Marcello Moores  Register [name]   On: 05/28/2021 09:04   ASSESSMENT AND PLAN:  Discussed the following assessment and plan:  Pneumonia of right upper lobe due to infectious organism - clinically resolved  fu x ray  today - Plan: DG Chest 2 View  Medication management - try different nasal  steroid. Clinically resolved   fu x ray today .  Can try nasacort  instead of fluticasone .  Ioatropium for drippy only if needed  -Patient advised  to return or notify health care team  if  new concerns arise.  Patient Instructions  Good to see you today .Gald you are better.   Will let you know results  when back .   Standley Brooking. Deron Poole [name]

## 2021-06-16 ENCOUNTER — Ambulatory Visit (INDEPENDENT_AMBULATORY_CARE_PROVIDER_SITE_OTHER): Payer: Medicare HMO | Admitting: Internal Medicine

## 2021-06-16 ENCOUNTER — Ambulatory Visit (INDEPENDENT_AMBULATORY_CARE_PROVIDER_SITE_OTHER): Payer: Medicare HMO

## 2021-06-16 ENCOUNTER — Encounter: Payer: Self-pay | Admitting: Internal Medicine

## 2021-06-16 VITALS — BP 120/70 | HR 75 | Temp 98.6°F | Ht 63.0 in | Wt 141.8 lb

## 2021-06-16 DIAGNOSIS — Z79899 Other long term (current) drug therapy: Secondary | ICD-10-CM | POA: Diagnosis not present

## 2021-06-16 DIAGNOSIS — J189 Pneumonia, unspecified organism: Secondary | ICD-10-CM | POA: Diagnosis not present

## 2021-06-16 NOTE — Patient Instructions (Signed)
Good to see you today .Gald you are better.   Will let you know results  when back .

## 2021-06-17 NOTE — Progress Notes (Signed)
Good news    repeat FU  shows  no abnormality on x ray  . Pneumonia is resolved .

## 2021-07-08 ENCOUNTER — Ambulatory Visit: Payer: Medicare HMO

## 2021-07-09 ENCOUNTER — Ambulatory Visit (INDEPENDENT_AMBULATORY_CARE_PROVIDER_SITE_OTHER): Payer: Medicare HMO

## 2021-07-09 VITALS — BP 120/60 | HR 75 | Temp 98.3°F | Ht 63.0 in | Wt 140.4 lb

## 2021-07-09 DIAGNOSIS — Z0000 Encounter for general adult medical examination without abnormal findings: Secondary | ICD-10-CM | POA: Diagnosis not present

## 2021-07-09 NOTE — Progress Notes (Signed)
Subjective:   Sheila Livingston is a 76 y.o. female who presents for Medicare Annual (Subsequent) preventive examination.  Review of Systems     Cardiac Risk Factors include: advanced age (>65mn, >>81women);hypertension     Objective:    Today's Vitals   07/09/21 1406  BP: 120/60  Pulse: 75  Temp: 98.3 F (36.8 C)  TempSrc: Oral  SpO2: 98%  Weight: 140 lb 6.4 oz (63.7 kg)  Height: '5\' 3"'$  (1.6 m)   Body mass index is 24.87 kg/m.     07/09/2021    2:26 PM 06/25/2020    1:19 PM 11/04/2017    8:40 AM 06/02/2016   12:43 PM 05/28/2016   10:56 AM 05/12/2016   11:10 AM  Advanced Directives  Does Patient Have a Medical Advance Directive? Yes Yes No Yes Yes No;Yes  Type of [place] Healthcare Power of [place] Does patient want to make changes to medical advance directive? No - Patient declined     No - Patient declined  Copy of [place] Chart? No - copy requested No - copy requested   No - copy requested No - copy requested  Would patient like information on creating a medical advance directive?   No - Patient declined   No - Patient declined    Current Medications (verified) Outpatient Encounter Medications as of 07/09/2021  Medication Sig   aspirin 81 MG chewable tablet Chew 81 mg by mouth daily.   Cholecalciferol (VITAMIN D3) 400 UNITS CAPS Take 1 capsule by mouth daily.   docusate sodium (COLACE) 100 MG capsule Take 100 mg by mouth 2 (two) times daily.   doxycycline (VIBRA-TABS) 100 MG tablet Take 1 tablet (100 mg total) by mouth 2 (two) times daily. For pneumoina (Patient not taking: Reported on 06/16/2021)   flunisolide (NASALIDE) 25 MCG/ACT (0.025%) SOLN Place 2 sprays into the nose 2 (two) times daily.   fluocinonide (LIDEX) 0.05 % external solution fluocinonide 0.05 % topical solution  APPLY 1 ML ON THE SKIN BID   hydrocortisone 2.5 % cream  hydrocortisone 2.5 % topical cream   ipratropium (ATROVENT) 0.06 % nasal spray Place 2 sprays into both nostrils 4 (four) times daily.   losartan (COZAAR) 50 MG tablet TAKE 1 TABLET EVERY DAY   LYSINE PO Take by mouth as needed.    MULTIPLE VITAMIN PO Take by mouth.   Olopatadine HCl (PATADAY) 0.2 % SOLN Apply 1 drop to eye daily.   Omega-3 Fatty Acids (FISH OIL) 1000 MG CAPS    triamcinolone cream (KENALOG) 0.1 % Apply 1 application topically 2 (two) times daily.   TURMERIC PO Take 750 mg by mouth daily.   valACYclovir (VALTREX) 500 MG tablet Take 4 tablets (2,000 mg total) by mouth 2 (two) times daily. Prn cold sores   No facility-administered encounter medications on file as of 07/09/2021.    Allergies (verified) Fluticasone propionate, Loperamide, Flonase [fluticasone propionate], Fluticasone propionate, Loperamide hcl, Loperamide hcl, Other, Benadryl [diphenhydramine], and Lisinopril   History: Past Medical History:  Diagnosis Date   Allergy    Anal fissure    Arthritis    hands   Benign paroxysmal positional vertigo    Complication of anesthesia    Endometriosis    Hemorrhoids    History of skin cancer    Hyperlipidemia    Hyperplastic colon polyp    Hypertension  IBS (irritable bowel syndrome)    Melanoma ([place]) 06/10/2016   right thumb    Melanoma in situ ([place]) 05/31/2016   Personal history of COVID-19    PONV (postoperative nausea and vomiting)    Past Surgical History:  Procedure Laterality Date   ABDOMINAL HYSTERECTOMY     bso enometriosis; no cancer   AMPUTATION Right 06/02/2016   Procedure: AMPUTATION RIGHT THUMB INTERPHALANGEAL JOINT;  Surgeon: Daryll Brod, MD;  Location: [facility];  Service: Orthopedics;  Laterality: Right;   BUNIONECTOMY Left 11/23/2008   NAILBED REPAIR Right 05/12/2016   Procedure: EXCISION OF NAIL MATRIX BED RIGHT;  Surgeon: Daryll Brod, MD;  Location: [facility];  Service: Orthopedics;  Laterality: Right;    ROTATOR CUFF REPAIR Right 10/2009   SKIN BIOPSY  03/16/2017   shave biopsy, right inferior helix, chondrodermatits nodularis helicis   SKIN FULL THICKNESS GRAFT Right 05/12/2016   Procedure: UPPER ARM SKIN GRAFT RIGHT THUMB;  Surgeon: Daryll Brod, MD;  Location: [facility];  Service: Orthopedics;  Laterality: Right;   thumb nail surgery Right    Family History  Problem Relation Age of Onset   Diabetes Mother    Stroke Mother    Breast cancer Sister    Colon polyps Sister    Diabetes Sister    Colon polyps Sister    Colon cancer Neg Hx    Social History   Socioeconomic History   Marital status: Married    Spouse name: Not on file   Number of children: 0   Years of education: Not on file   Highest education level: Not on file  Occupational History    Comment: retired   Tobacco Use   Smoking status: Former   Smokeless tobacco: Never  Scientific laboratory technician Use: Never used  Substance and Sexual Activity   Alcohol use: Yes    Alcohol/week: 0.0 standard drinks of alcohol    Comment: rare   Drug use: No   Sexual activity: Not on file  Other Topics Concern   Not on file  Social History Narrative   Widowed   Remarried 2015 2016   [facility] of 2   Dog and Cat   6-7 hours of sleep   Mom passed away 2010-01-09   Working [employer]  26 - 35  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   Social Determinants of Health   Financial Resource Strain: Low Risk  (07/09/2021)   Overall Financial Resource Strain (CARDIA)    Difficulty of Paying Living Expenses: Not hard at all  Food Insecurity: No Food Insecurity (07/09/2021)   Hunger Vital Sign    Worried About Running Out of Food in the Last Year: Never true    Ran Out of Food in the Last Year: Never true  Transportation Needs: No Transportation Needs (07/09/2021)   PRAPARE - Hydrologist (Medical): No    Lack of Transportation (Non-Medical):  No  Physical Activity: Sufficiently Active (07/09/2021)   Exercise Vital Sign    Days of Exercise per Week: 5 days    Minutes of Exercise per Session: 40 min  Stress: No Stress Concern Present (07/09/2021)   [place]    Feeling of Stress : Not at all  Social Connections: [place] (07/09/2021)   Social Connection and  Isolation Panel [NHANES]    Frequency of Communication with Friends and Family: More than three times a week    Frequency of Social Gatherings with Friends and Family: More than three times a week    Attends Religious Services: More than 4 times per year    Active Member of [employer] or Organizations: Yes    Attends Music therapist: More than 4 times per year    Marital Status: Married     Clinical Intake:  Diabetic?  No  Activities of Daily Living    07/09/2021    2:25 PM  In your present state of health, do you have any difficulty performing the following activities:  Hearing? 0  Vision? 0  Difficulty concentrating or making decisions? 0  Walking or climbing stairs? 0  Dressing or bathing? 0  Doing errands, shopping? 0  Preparing Food and eating ? N  Using the Toilet? N  In the past six months, have you accidently leaked urine? N  Do you have problems with loss of bowel control? N  Managing your Medications? N  Managing your Finances? N  Housekeeping or managing your Housekeeping? N    Patient Care Team: Panosh, Standley Brooking, MD as PCP - General (Internal Medicine) Sydnee Cabal, MD (Orthopedic Surgery) amy Casebeer  Linda Hedges, DO as Consulting Physician (Obstetrics and Gynecology) Daryll Brod, MD as Consulting Physician (Orthopedic Surgery) Johnathan Hausen, MD as Consulting Physician (General Surgery) Ayesha Mohair (Dermatology)  Indicate any recent Medical Services you may have received from other than [facility] providers in the past year (date may be  approximate).     Assessment:   This is a routine wellness examination for Porter.  Hearing/Vision screen Hearing Screening - Comments:: Wears hearing aids Vision Screening - Comments:: Wears glasses and contacts. Followed by Dr Joya San  Dietary issues and exercise activities discussed: Exercise limited by: None identified   Goals Addressed               This Visit's Progress     Patient Stated (pt-stated)        Maintain weight and stay active       Depression Screen    07/09/2021    2:22 PM 06/16/2021   11:05 AM 05/27/2021    1:22 PM 05/16/2021    3:01 PM 02/26/2021   11:28 AM 06/25/2020    1:18 PM 02/06/2020    9:21 AM  PHQ 2/9 Scores  PHQ - 2 Score 0 0 0 0 0 0 0  PHQ- 9 Score 0 0 0  0      Fall Risk    07/09/2021    2:25 PM 06/16/2021   11:04 AM 05/27/2021    1:22 PM 02/26/2021   11:28 AM 06/25/2020    1:20 PM  [place] in the past year? 0 0 0 0 0  Number falls in past yr: 0 0 0  0  Injury with Fall? 0 1 0  0  Risk for fall due to : No Fall Risks No Fall Risks No Fall Risks  Impaired vision  Follow up  Falls evaluation completed Falls evaluation completed  Falls prevention discussed    FALL RISK PREVENTION PERTAINING TO THE HOME:  Any stairs in or around the home? Yes  If so, are there any without handrails? No  Home free of loose throw rugs in walkways, pet beds, electrical cords, etc? Yes  Adequate lighting in your home to reduce risk  of falls? Yes   ASSISTIVE DEVICES UTILIZED TO PREVENT FALLS:  Life alert? No  Use of a cane, walker or w/c? No  Grab bars in the bathroom? No  Shower chair or bench in shower? No  Elevated toilet seat or a handicapped toilet? No   TIMED UP AND GO:  Was the test performed? Yes .  Length of time to ambulate 10 feet: 5 sec.   Gait steady and fast without use of assistive device  Cognitive Function:        07/09/2021    2:26 PM 06/25/2020    1:22 PM  6CIT Screen  What Year? 0 points 0 points  What month? 0  points 0 points  What time? 0 points 0 points  Count back from 20 0 points 0 points  Months in reverse 0 points 0 points  Repeat phrase 0 points 0 points  Total Score 0 points 0 points    Immunizations Immunization History  Administered Date(s) Administered   Fluad Quad(high Dose 65+) 10/18/2018, 11/29/2020   Influenza Split 09/26/2012, 10/25/2013   Influenza Whole 10/26/2008   Influenza, High Dose Seasonal PF 12/16/2016, 11/11/2017   Influenza, Seasonal, Injecte, Preservative Fre 10/30/2015   Influenza,Quad,Nasal, Live 09/26/2012, 10/25/2013   Influenza-Unspecified 10/30/2015, 10/18/2018   Pneumococcal Conjugate-13 01/25/2013   Pneumococcal Polysaccharide-23 01/09/2009   Td 01/26/2005   Zoster, Live 01/26/2012      Flu Vaccine status: Up to date  Pneumococcal vaccine status: Up to date  Covid-19 vaccine status: Declined, Education has been provided regarding the importance of this vaccine but patient still declined. Advised may receive this vaccine at local pharmacy or Health Dept.or vaccine clinic. Aware to provide a copy of the vaccination record if obtained from local pharmacy or Health Dept. Verbalized acceptance and understanding.  Qualifies for Shingles Vaccine? Yes   Zostavax completed No   Shingrix Completed?: No.    Education has been provided regarding the importance of this vaccine. Patient has been advised to call insurance company to determine out of pocket expense if they have not yet received this vaccine. Advised may also receive vaccine at local pharmacy or Health Dept. Verbalized acceptance and understanding.  Screening Tests Health Maintenance  Topic Date Due   COVID-19 Vaccine (1) 08/26/2021 (Originally 11/25/1945)   Zoster Vaccines- Shingrix (1 of 2) 08/26/2021 (Originally 05/26/1964)   TETANUS/TDAP  11/27/2021 (Originally 01/27/2015)   INFLUENZA VACCINE  08/26/2021   Pneumonia Vaccine 74+ Years old  Completed   DEXA SCAN  Completed   Hepatitis C  Screening  Completed   HPV VACCINES  Aged Out   COLONOSCOPY (Pts 45-71yr Insurance coverage will need to be confirmed)  Discontinued    Health Maintenance  There are no preventive care reminders to display for this patient.  Colorectal cancer screening: No longer required.   Mammogram status: No longer required due to Age.  Bone Density status: Completed 07/11/19. Results reflect: Bone density results: OSTEOPENIA. Repeat every   years.  Lung Cancer Screening: (Low Dose CT Chest recommended if Age 78-80years, 30 pack-year currently smoking OR have quit w/in 15years.) does not qualify.     Additional Screening:  Hepatitis C Screening: does qualify; Completed 02/03/18  Vision Screening: Recommended annual ophthalmology exams for early detection of glaucoma and other disorders of the eye. Is the patient up to date with their annual eye exam?  Yes  Who is the provider or what is the name of the office in which the patient attends annual eye exams?  Dr Joya San If pt is not established with a provider, would they like to be referred to a provider to establish care? No .   Dental Screening: Recommended annual dental exams for proper oral hygiene  Community Resource Referral / Chronic Care Management:  CRR required this visit?  No   CCM required this visit?  No      Plan:     I have personally reviewed and noted the following in the patient's chart:   Medical and social history Use of alcohol, tobacco or illicit drugs  Current medications and supplements including opioid prescriptions.  Functional ability and status Nutritional status Physical activity Advanced directives List of other physicians Hospitalizations, surgeries, and ER visits in previous 12 months Vitals Screenings to include cognitive, depression, and falls Referrals and appointments  In addition, I have reviewed and discussed with patient certain preventive protocols, quality metrics, and best practice  recommendations. A written personalized care plan for preventive services as well as general preventive health recommendations were provided to patient.     Criselda Peaches, LPN   09/13/5907   Nurse Notes: None

## 2021-07-09 NOTE — Patient Instructions (Signed)
Sheila Livingston , Thank you for taking time to come for your Medicare Wellness Visit. I appreciate your ongoing commitment to your health goals. Please review the following plan we discussed and let me know if I can assist you in the future.   These are the goals we discussed:  Goals      Patient Stated     Maintain weight and stay active        This is a list of the screening recommended for you and due dates:  Health Maintenance  Topic Date Due   COVID-19 Vaccine (1) 08/26/2021*   Zoster (Shingles) Vaccine (1 of 2) 08/26/2021*   Tetanus Vaccine  11/27/2021*   Flu Shot  08/26/2021   Pneumonia Vaccine  Completed   DEXA scan (bone density measurement)  Completed   Hepatitis C Screening: USPSTF Recommendation to screen - Ages 3-79 yo.  Completed   HPV Vaccine  Aged Out   Colon Cancer Screening  Discontinued  *Topic was postponed. The date shown is not the original due date.   Advanced directives: Yes   Conditions/risks identified: None  Next appointment: Follow up in one year for your annual wellness visit     Preventive Care 65 Years and Older, Female Preventive care refers to lifestyle choices and visits with your health care provider that can promote health and wellness. What does preventive care include? A yearly physical exam. This is also called an annual well check. Dental exams once or twice a year. Routine eye exams. Ask your health care provider how often you should have your eyes checked. Personal lifestyle choices, including: Daily care of your teeth and gums. Regular physical activity. Eating a healthy diet. Avoiding tobacco and drug use. Limiting alcohol use. Practicing safe sex. Taking low-dose aspirin every day. Taking vitamin and mineral supplements as recommended by your health care provider. What happens during an annual well check? The services and screenings done by your health care provider during your annual well check will depend on your age,  overall health, lifestyle risk factors, and family history of disease. Counseling  Your health care provider may ask you questions about your: Alcohol use. Tobacco use. Drug use. Emotional well-being. Home and relationship well-being. Sexual activity. Eating habits. History of falls. Memory and ability to understand (cognition). Work and work Statistician. Reproductive health. Screening  You may have the following tests or measurements: Height, weight, and BMI. Blood pressure. Lipid and cholesterol levels. These may be checked every 5 years, or more frequently if you are over 56 years old. Skin check. Lung cancer screening. You may have this screening every year starting at age 31 if you have a 30-pack-year history of smoking and currently smoke or have quit within the past 15 years. Fecal occult blood test (FOBT) of the stool. You may have this test every year starting at age 20. Flexible sigmoidoscopy or colonoscopy. You may have a sigmoidoscopy every 5 years or a colonoscopy every 10 years starting at age 16. Hepatitis C blood test. Hepatitis B blood test. Sexually transmitted disease (STD) testing. Diabetes screening. This is done by checking your blood sugar (glucose) after you have not eaten for a while (fasting). You may have this done every 1-3 years. Bone density scan. This is done to screen for osteoporosis. You may have this done starting at age 25. Mammogram. This may be done every 1-2 years. Talk to your health care provider about how often you should have regular mammograms. Talk with your health care  provider about your test results, treatment options, and if necessary, the need for more tests. Vaccines  Your health care provider may recommend certain vaccines, such as: Influenza vaccine. This is recommended every year. Tetanus, diphtheria, and acellular pertussis (Tdap, Td) vaccine. You may need a Td booster every 10 years. Zoster vaccine. You may need this after age  82. Pneumococcal 13-valent conjugate (PCV13) vaccine. One dose is recommended after age 79. Pneumococcal polysaccharide (PPSV23) vaccine. One dose is recommended after age 51. Talk to your health care provider about which screenings and vaccines you need and how often you need them. This information is not intended to replace advice given to you by your health care provider. Make sure you discuss any questions you have with your health care provider. Document Released: 02/08/2015 Document Revised: 10/02/2015 Document Reviewed: 11/13/2014 Elsevier Interactive Patient Education  2017 [place] Prevention in the Home Falls can cause injuries. They can happen to people of all ages. There are many things you can do to make your home safe and to help prevent falls. What can I do on the outside of my home? Regularly fix the edges of walkways and driveways and fix any cracks. Remove anything that might make you trip as you walk through a door, such as a raised step or threshold. Trim any bushes or trees on the path to your home. Use bright outdoor lighting. Clear any walking paths of anything that might make someone trip, such as rocks or tools. Regularly check to see if handrails are loose or broken. Make sure that both sides of any steps have handrails. Any raised decks and porches should have guardrails on the edges. Have any leaves, snow, or ice cleared regularly. Use sand or salt on walking paths during winter. Clean up any spills in your garage right away. This includes oil or grease spills. What can I do in the bathroom? Use night lights. Install grab bars by the toilet and in the tub and shower. Do not use towel bars as grab bars. Use non-skid mats or decals in the tub or shower. If you need to sit down in the shower, use a plastic, non-slip stool. Keep the floor dry. Clean up any water that spills on the floor as soon as it happens. Remove soap buildup in the tub or shower  regularly. Attach bath mats securely with double-sided non-slip rug tape. Do not have throw rugs and other things on the floor that can make you trip. What can I do in the bedroom? Use night lights. Make sure that you have a light by your bed that is easy to reach. Do not use any sheets or blankets that are too big for your bed. They should not hang down onto the floor. Have a firm chair that has side arms. You can use this for support while you get dressed. Do not have throw rugs and other things on the floor that can make you trip. What can I do in the kitchen? Clean up any spills right away. Avoid walking on wet floors. Keep items that you use a lot in easy-to-reach places. If you need to reach something above you, use a strong step stool that has a grab bar. Keep electrical cords out of the way. Do not use floor polish or wax that makes floors slippery. If you must use wax, use non-skid floor wax. Do not have throw rugs and other things on the floor that can make you trip. What can I  do with my stairs? Do not leave any items on the stairs. Make sure that there are handrails on both sides of the stairs and use them. Fix handrails that are broken or loose. Make sure that handrails are as long as the stairways. Check any carpeting to make sure that it is firmly attached to the stairs. Fix any carpet that is loose or worn. Avoid having throw rugs at the top or bottom of the stairs. If you do have throw rugs, attach them to the floor with carpet tape. Make sure that you have a light switch at the top of the stairs and the bottom of the stairs. If you do not have them, ask someone to add them for you. What else can I do to help prevent falls? Wear shoes that: Do not have high heels. Have rubber bottoms. Are comfortable and fit you well. Are closed at the toe. Do not wear sandals. If you use a stepladder: Make sure that it is fully opened. Do not climb a closed stepladder. Make sure that  both sides of the stepladder are locked into place. Ask someone to hold it for you, if possible. Clearly mark and make sure that you can see: Any grab bars or handrails. First and last steps. Where the edge of each step is. Use tools that help you move around (mobility aids) if they are needed. These include: Canes. Walkers. Scooters. Crutches. Turn on the lights when you go into a dark area. Replace any light bulbs as soon as they burn out. Set up your furniture so you have a clear path. Avoid moving your furniture around. If any of your floors are uneven, fix them. If there are any pets around you, be aware of where they are. Review your medicines with your doctor. Some medicines can make you feel dizzy. This can increase your chance of falling. Ask your doctor what other things that you can do to help prevent falls. This information is not intended to replace advice given to you by your health care provider. Make sure you discuss any questions you have with your health care provider. Document Released: 11/08/2008 Document Revised: 06/20/2015 Document Reviewed: 02/16/2014 Elsevier Interactive Patient Education  2017 [employer].

## 2021-07-28 DIAGNOSIS — Z1231 Encounter for screening mammogram for malignant neoplasm of breast: Secondary | ICD-10-CM | POA: Diagnosis not present

## 2021-07-28 LAB — HM MAMMOGRAPHY

## 2021-08-01 ENCOUNTER — Encounter: Payer: Self-pay | Admitting: Internal Medicine

## 2021-08-05 DIAGNOSIS — R928 Other abnormal and inconclusive findings on diagnostic imaging of breast: Secondary | ICD-10-CM | POA: Diagnosis not present

## 2021-08-05 DIAGNOSIS — R922 Inconclusive mammogram: Secondary | ICD-10-CM | POA: Diagnosis not present

## 2021-08-05 LAB — HM MAMMOGRAPHY

## 2021-08-06 ENCOUNTER — Encounter: Payer: Self-pay | Admitting: Internal Medicine

## 2021-08-11 DIAGNOSIS — Z01419 Encounter for gynecological examination (general) (routine) without abnormal findings: Secondary | ICD-10-CM | POA: Diagnosis not present

## 2021-08-11 DIAGNOSIS — Z6824 Body mass index (BMI) 24.0-24.9, adult: Secondary | ICD-10-CM | POA: Diagnosis not present

## 2021-08-15 DIAGNOSIS — J301 Allergic rhinitis due to pollen: Secondary | ICD-10-CM | POA: Diagnosis not present

## 2021-08-15 DIAGNOSIS — J019 Acute sinusitis, unspecified: Secondary | ICD-10-CM | POA: Diagnosis not present

## 2021-08-15 DIAGNOSIS — J342 Deviated nasal septum: Secondary | ICD-10-CM | POA: Diagnosis not present

## 2021-10-29 ENCOUNTER — Telehealth (INDEPENDENT_AMBULATORY_CARE_PROVIDER_SITE_OTHER): Payer: Medicare HMO | Admitting: Family Medicine

## 2021-10-29 ENCOUNTER — Telehealth: Payer: Self-pay | Admitting: Internal Medicine

## 2021-10-29 ENCOUNTER — Telehealth: Payer: Self-pay

## 2021-10-29 ENCOUNTER — Encounter: Payer: Self-pay | Admitting: Family Medicine

## 2021-10-29 VITALS — Temp 99.3°F | Ht 63.0 in

## 2021-10-29 DIAGNOSIS — U071 COVID-19: Secondary | ICD-10-CM | POA: Diagnosis not present

## 2021-10-29 MED ORDER — NIRMATRELVIR/RITONAVIR (PAXLOVID)TABLET: 3.0000 | ORAL_TABLET | Freq: Two times a day (BID) | ORAL | 0 refills | Status: AC

## 2021-10-29 NOTE — Telephone Encounter (Signed)
She can come to be tested.

## 2021-10-29 NOTE — Progress Notes (Signed)
Virtual Visit via Telephone Note I connected with Sheila Livingston on 10/29/21 at 11:00 AM EDT by telephone and verified that I am speaking with the correct person using two identifiers.   I discussed the limitations, risks, security and privacy concerns of performing an evaluation and management service by telephone and the availability of in person appointments. I also discussed with the patient that there may be a patient responsible charge related to this service. The patient expressed understanding and agreed to proceed.  Location patient: home Location provider: work office Participants present for the call: patient, provider Patient did not have a visit in the prior 7 days to address this/these issue(s).  Chief Complaint  Patient presents with   Covid Positive    Chills, fever, cough. Home test was positive last night.    History of Present Illness: Sheila Livingston is a 76 y.o.female with hx of hypertension, hyperlipidemia, and allergy rhinitis complaining of a day of respiratory symptoms as described above and positive home COVID-19 test today. Fatigue, fever, decreased appetite,headache, rhinorrhea, postnasal drainage, nonproductive cough, and body aches. + Nasal congestion, no more than usual. Max temperature 100.5 F Mild nausea. Negative for CP, dyspnea, wheezing, abdominal pain, changes in bowel habits, vomiting, urinary symptoms, or a skin rash.  She has taken Tylenol. No known sick contacts or recent travel. She has not had COVID-19 vaccines.  Observations/Objective: Patient sounds cheerful and well on the phone. I do not appreciate any SOB. Speech and thought processing are grossly intact. Patient reported vitals:Temp 99.3 F (37.4 C)   Ht '5\' 3"'$  (1.6 m)   BMI 24.87 kg/m   Assessment and Plan:  1. COVID-19 virus infection We discussed Dx,possible complications and treatment options. She has a mild case with risk for complications. We discussed oral antiviral options and  side effects. She agrees with trying Paxlovid. Symptomatic treatment with plenty of fluids,rest,tylenol 500 mg 3-4 times per day prn. Monitor for new symptoms. Throat lozenges if she starts with sore throat. 5 to 7 days of quarantine recommended. Cough and congestion may last a few more days and even weeks after acute symptoms have resolved. Clearly instructed about warning signs.  - nirmatrelvir/ritonavir EUA (PAXLOVID) 20 x 150 MG & 10 x '100MG'$  TABS; Take 3 tablets by mouth 2 (two) times daily for 5 days. (Take nirmatrelvir 150 mg two tablets twice daily for 5 days and ritonavir 100 mg one tablet twice daily for 5 days) Patient GFR is 65 (02/2021).  Dispense: 30 tablet; Refill: 0  Follow Up Instructions:  Return if symptoms worsen or fail to improve.  I did not refer this patient for an OV in the next 24 hours for this/these issue(s).  I discussed the assessment and treatment plan with the patient. The patient was provided an opportunity to ask questions and all were answered. The patient agreed with the plan and demonstrated an understanding of the instructions.   The patient was advised to call back or seek an in-person evaluation if the symptoms worsen or if the condition fails to improve as anticipated.  I provided  8 minutes of non-face-to-face time during this encounter. Cindy Fullman G. Martinique, MD  [facility]. [place] office.

## 2021-10-29 NOTE — Telephone Encounter (Signed)
Called patient in regards to Sheila Livingston on my chart. Left VM to call back if she has any concerns.

## 2021-10-29 NOTE — Telephone Encounter (Signed)
Pt just had a VV with Dr. Martinique at [facility] just called back to ask if she can come in this afternoon to be tested for Covid, since the test she used at home was old and she doesn't trust it.  Please advise.

## 2021-11-03 ENCOUNTER — Telehealth: Payer: Self-pay

## 2021-11-03 NOTE — Telephone Encounter (Signed)
Patient did not come to the office to be tested; patient has been using the mychart monitoring with regular check ins.

## 2021-11-03 NOTE — Telephone Encounter (Signed)
Called, spoke with patient. She states that she is not feeling weak and feels better. Was not able to provide care advise for weakness. Advised patient if she felt weak or not feeling well to notify PCP. Patient verb understanding.

## 2021-11-05 ENCOUNTER — Encounter (INDEPENDENT_AMBULATORY_CARE_PROVIDER_SITE_OTHER): Payer: Self-pay

## 2021-11-17 DIAGNOSIS — H524 Presbyopia: Secondary | ICD-10-CM | POA: Diagnosis not present

## 2021-12-08 DIAGNOSIS — D485 Neoplasm of uncertain behavior of skin: Secondary | ICD-10-CM | POA: Diagnosis not present

## 2021-12-08 DIAGNOSIS — L910 Hypertrophic scar: Secondary | ICD-10-CM | POA: Diagnosis not present

## 2021-12-08 DIAGNOSIS — L814 Other melanin hyperpigmentation: Secondary | ICD-10-CM | POA: Diagnosis not present

## 2021-12-08 DIAGNOSIS — Z08 Encounter for follow-up examination after completed treatment for malignant neoplasm: Secondary | ICD-10-CM | POA: Diagnosis not present

## 2021-12-08 DIAGNOSIS — D225 Melanocytic nevi of trunk: Secondary | ICD-10-CM | POA: Diagnosis not present

## 2021-12-08 DIAGNOSIS — L821 Other seborrheic keratosis: Secondary | ICD-10-CM | POA: Diagnosis not present

## 2021-12-08 DIAGNOSIS — L570 Actinic keratosis: Secondary | ICD-10-CM | POA: Diagnosis not present

## 2021-12-08 DIAGNOSIS — Z85828 Personal history of other malignant neoplasm of skin: Secondary | ICD-10-CM | POA: Diagnosis not present

## 2021-12-08 DIAGNOSIS — Z85820 Personal history of malignant melanoma of skin: Secondary | ICD-10-CM | POA: Diagnosis not present

## 2022-02-26 ENCOUNTER — Telehealth: Payer: Self-pay

## 2022-02-26 NOTE — Telephone Encounter (Signed)
Patient calling to have [place] order placed before her appointment on 04/21/22.

## 2022-02-27 ENCOUNTER — Other Ambulatory Visit: Payer: Self-pay | Admitting: Internal Medicine

## 2022-02-27 DIAGNOSIS — E042 Nontoxic multinodular goiter: Secondary | ICD-10-CM

## 2022-03-12 DIAGNOSIS — H903 Sensorineural hearing loss, bilateral: Secondary | ICD-10-CM | POA: Diagnosis not present

## 2022-03-12 DIAGNOSIS — J309 Allergic rhinitis, unspecified: Secondary | ICD-10-CM | POA: Diagnosis not present

## 2022-03-18 ENCOUNTER — Other Ambulatory Visit (INDEPENDENT_AMBULATORY_CARE_PROVIDER_SITE_OTHER): Payer: Medicare HMO

## 2022-03-18 ENCOUNTER — Encounter: Payer: Self-pay | Admitting: Internal Medicine

## 2022-03-18 ENCOUNTER — Ambulatory Visit (INDEPENDENT_AMBULATORY_CARE_PROVIDER_SITE_OTHER): Payer: Medicare HMO | Admitting: Internal Medicine

## 2022-03-18 VITALS — BP 130/66 | HR 59 | Temp 97.8°F | Ht 63.8 in | Wt 141.0 lb

## 2022-03-18 DIAGNOSIS — J301 Allergic rhinitis due to pollen: Secondary | ICD-10-CM

## 2022-03-18 DIAGNOSIS — Z0000 Encounter for general adult medical examination without abnormal findings: Secondary | ICD-10-CM

## 2022-03-18 DIAGNOSIS — Z79899 Other long term (current) drug therapy: Secondary | ICD-10-CM

## 2022-03-18 DIAGNOSIS — Z1211 Encounter for screening for malignant neoplasm of colon: Secondary | ICD-10-CM

## 2022-03-18 DIAGNOSIS — I10 Essential (primary) hypertension: Secondary | ICD-10-CM

## 2022-03-18 DIAGNOSIS — Z86006 Personal history of melanoma in-situ: Secondary | ICD-10-CM

## 2022-03-18 DIAGNOSIS — E785 Hyperlipidemia, unspecified: Secondary | ICD-10-CM

## 2022-03-18 DIAGNOSIS — E041 Nontoxic single thyroid nodule: Secondary | ICD-10-CM

## 2022-03-18 DIAGNOSIS — N6459 Other signs and symptoms in breast: Secondary | ICD-10-CM

## 2022-03-18 LAB — CBC WITH DIFFERENTIAL/PLATELET
Basophils Absolute: 0 10*3/uL (ref 0.0–0.1)
Basophils Relative: 0.5 % (ref 0.0–3.0)
Eosinophils Absolute: 0.1 10*3/uL (ref 0.0–0.7)
Eosinophils Relative: 1.6 % (ref 0.0–5.0)
HCT: 39.4 % (ref 36.0–46.0)
Hemoglobin: 13.5 g/dL (ref 12.0–15.0)
Lymphocytes Relative: 35.7 % (ref 12.0–46.0)
Lymphs Abs: 2.6 10*3/uL (ref 0.7–4.0)
MCHC: 34.3 g/dL (ref 30.0–36.0)
MCV: 90.9 fl (ref 78.0–100.0)
Monocytes Absolute: 0.5 10*3/uL (ref 0.1–1.0)
Monocytes Relative: 7.2 % (ref 3.0–12.0)
Neutro Abs: 3.9 10*3/uL (ref 1.4–7.7)
Neutrophils Relative %: 55 % (ref 43.0–77.0)
Platelets: 258 10*3/uL (ref 150.0–400.0)
RBC: 4.33 Mil/uL (ref 3.87–5.11)
RDW: 13.5 % (ref 11.5–15.5)
WBC: 7.2 10*3/uL (ref 4.0–10.5)

## 2022-03-18 LAB — TSH: TSH: 0.92 u[IU]/mL (ref 0.35–5.50)

## 2022-03-18 MED ORDER — LOSARTAN POTASSIUM 50 MG PO TABS: 50.0000 mg | ORAL_TABLET | Freq: Every day | ORAL | 3 refills | Status: DC

## 2022-03-18 NOTE — Progress Notes (Unsigned)
No chief complaint on file.   HPI: Patient  Sheila Livingston  77 y.o. comes in today for Preventive Health Care visit  and med management   HT losartan   On valtrex as needed  \ Allergy runny nose :   ent  Under eval for thyroid nodules  dr Renne Crigler  hx bx  following  .  GYNE:  Updated derm   Hearing tests recnetly   Health Maintenance  Topic Date Due   COVID-19 Vaccine (1) Never done   Zoster Vaccines- Shingrix (1 of 2) Never done   DTaP/Tdap/Td (2 - Tdap) 01/27/2015   Medicare Annual Wellness (AWV)  07/10/2022   Pneumonia Vaccine 96+ Years old  Completed   INFLUENZA VACCINE  Completed   DEXA SCAN  Completed   Hepatitis C Screening  Completed   HPV VACCINES  Aged Out   COLONOSCOPY (Pts 45-21yr Insurance coverage will need to be confirmed)  Discontinued   Health Maintenance Review LIFESTYLE:  Exercise:  walk dog and other  bowling  Tobacco/ETS:n Alcohol:  ocass Sugar beverages:  n Sleep:  8  Drug use: no HH of  2 pet dog  Work:    ROS:  GEN/ HEENT: No fever, significant weight changes sweats headaches vision problems hearing changes, CV/ PULM; No chest pain shortness of breath cough, syncope,edema  change in exercise tolerance. GI /GU: No adominal pain, vomiting, change in bowel habits. No blood in the stool. No significant GU symptoms. SKIN/HEME: ,no acute skin rashes suspicious lesions or bleeding. No lymphadenopathy, nodules, masses.  NEURO/ PSYCH:  No neurologic signs such as weakness numbness. No depression anxiety. IMM/ Allergy: No unusual infections.  Allergy .   REST of 12 system review negative except as per HPI   Past Medical History:  Diagnosis Date   Allergy    Anal fissure    Arthritis    hands   Benign paroxysmal positional vertigo    Complication of anesthesia    Endometriosis    Hemorrhoids    History of skin cancer    Hyperlipidemia    Hyperplastic colon polyp    Hypertension    IBS (irritable bowel syndrome)    [place]  06[place]   right thumb    [place]   Personal history of COVID-19    PONV (postoperative nausea and vomiting)     Past Surgical History:  Procedure Laterality Date   ABDOMINAL HYSTERECTOMY     bso enometriosis; no cancer   AMPUTATION Right 06/02/2016   Procedure: AMPUTATION RIGHT THUMB INTERPHALANGEAL JOINT;  Surgeon: KDaryll Brod MD;  [place] [facility] [facility]  Service: Orthopedics;  Laterality: Right;   BUNIONECTOMY Left 11/23/2008   NAILBED REPAIR Right 05/12/2016   Procedure: EXCISION OF NAIL MATRIX BED RIGHT;  Surgeon: GDaryll Brod MD;  [place] [facility]  Service: Orthopedics;  Laterality: Right;   ROTATOR CUFF REPAIR Right 10/2009   SKIN BIOPSY  03/16/2017   shave biopsy, right inferior helix, chondrodermatits nodularis helicis   SKIN FULL THICKNESS GRAFT Right 05/12/2016   Procedure: UPPER ARM SKIN GRAFT RIGHT THUMB;  Surgeon: GDaryll Brod MD;  [place]  Service: Orthopedics;  Laterality: Right;   thumb nail surgery Right     Family History  Problem Relation Age of Onset   Diabetes Mother    Stroke Mother    Breast cancer Sister    Colon polyps Sister    Diabetes Sister  Colon polyps Sister    Colon cancer Neg Hx     Social History   Socioeconomic History   Marital status: Married    Spouse name: Not on file   Number of children: 0   Years of education: Not on file   Highest education level: Not on file  Occupational History    Comment: retired   Tobacco Use   Smoking status: Former   Smokeless tobacco: Never  Scientific laboratory technician Use: Never used  Substance and Sexual Activity   Alcohol use: Yes    Alcohol/week: 0.0 standard drinks of alcohol    Comment: rare   Drug use: No   Sexual activity: Not on file  Other Topics Concern   Not on file  Social History Narrative   Widowed   Remarried 2015 2016   [facility] of 2   Dog and Cat   6-7 hours of sleep   Mom passed away December 26, 2009   Working [employer] jeans  6 - 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   Social Determinants of Health   Financial Resource Strain: Low Risk  (07/09/2021)   Overall Financial Resource Strain (CARDIA)    Difficulty of Paying Living Expenses: Not hard at all  Food Insecurity: No Food Insecurity (07/09/2021)   Hunger Vital Sign    Worried About Running Out of Food in the Last Year: Never true    Ran Out of Food in the Last Year: Never true  Transportation Needs: No Transportation Needs (07/09/2021)   PRAPARE - Hydrologist (Medical): No    Lack of Transportation (Non-Medical): No  Physical Activity: Sufficiently Active (07/09/2021)   Exercise Vital Sign    Days of Exercise per Week: 5 days    Minutes of Exercise per Session: 40 min  Stress: No Stress Concern Present (07/09/2021)   [place]    Feeling of Stress : Not at all  Social Connections: [place] (07/09/2021)   Social Connection and Isolation Panel [NHANES]    Frequency of Communication with Friends and Family: More than three times a week    Frequency of Social Gatherings with Friends and Family: More than three times a week    Attends Religious Services: More than 4 times per year    Active Member of Clubs or Organizations: Yes    Attends Music therapist: More than 4 times per year    Marital Status: Married    Outpatient Medications Prior to Visit  Medication Sig Dispense Refill   aspirin 81 MG chewable tablet Chew 81 mg by mouth daily.     Cholecalciferol (VITAMIN D3) 400 UNITS CAPS Take 1 capsule by mouth daily.     docusate sodium (COLACE) 100 MG capsule Take 100 mg by mouth 2 (two) times daily.     flunisolide (NASALIDE) 25 MCG/ACT (0.025%) SOLN Place 2 sprays into the nose 2 (two) times daily. 25 mL 0   fluocinonide (LIDEX) 0.05 % external  solution fluocinonide 0.05 % topical solution  APPLY 1 ML ON THE SKIN BID     hydrocortisone 2.5 % cream hydrocortisone 2.5 % topical cream     ipratropium (ATROVENT) 0.06 % nasal spray Place 2 sprays into both nostrils 4 (four) times daily. 15 mL 1   losartan (COZAAR) 50 MG tablet TAKE  1 TABLET EVERY DAY 90 tablet 1   LYSINE PO Take by mouth as needed.      MULTIPLE VITAMIN PO Take by mouth.     Olopatadine HCl (PATADAY) 0.2 % SOLN Apply 1 drop to eye daily. 2.5 mL 1   Omega-3 Fatty Acids (FISH OIL) 1000 MG CAPS      triamcinolone cream (KENALOG) 0.1 % Apply 1 application topically 2 (two) times daily.     TURMERIC PO Take 750 mg by mouth daily.     valACYclovir (VALTREX) 500 MG tablet Take 4 tablets (2,000 mg total) by mouth 2 (two) times daily. Prn cold sores 60 tablet 2   No facility-administered medications prior to visit.     EXAM:  There were no vitals taken for this visit.  There is no height or weight on file to calculate BMI. Wt Readings from Last 3 Encounters:  07/09/21 140 lb 6.4 oz (63.7 kg)  06/16/21 141 lb 12.8 oz (64.3 kg)  05/27/21 140 lb (63.5 kg)    Physical Exam: Vital signs reviewed [id number] is a well-developed well-nourished alert cooperative    who appearsr stated age in no acute distress.  HEENT: normocephalic atraumatic , Eyes: PERRL EOM's full, conjunctiva clear, Nares: paten,t no deformity discharge or tenderness., Ears: no deformity EAC's clear TMs with normal landmarks. Mouth: clear OP, no lesions, edema.  Moist mucous membranes. Dentition in adequate repair. NECK: supple without masses, thyromegaly or bruits. CHEST/PULM:  Clear to auscultation and percussion breath sounds equal no wheeze , rales or rhonchi. No chest Gilkey deformities or tenderness. Breast: normal by inspection . No dimpling, discharge, masses, tenderness or discharge . CV: PMI is nondisplaced, S1 S2 no gallops, murmurs, rubs. Peripheral pulses are full without delay.No JVD .  ABDOMEN:  Bowel sounds normal nontender  No guard or rebound, no hepato splenomegal no CVA tenderness.  No hernia. Extremtities:  No clubbing cyanosis or edema, no acute joint swelling or redness no focal atrophy NEURO:  Oriented x3, cranial nerves 3-12 appear to be intact, no obvious focal weakness,gait within normal limits no abnormal reflexes or asymmetrical SKIN: No acute rashes normal turgor, color, no bruising or petechiae. PSYCH: Oriented, good eye contact, no obvious depression anxiety, cognition and judgment appear normal. LN: no cervical axillary inguinal adenopathy  Lab Results  Component Value Date   WBC 10.6 (H) 05/27/2021   HGB 12.6 05/27/2021   HCT 36.3 05/27/2021   PLT 308.0 05/27/2021   GLUCOSE 93 02/26/2021   CHOL 220 (H) 02/26/2021   TRIG 118.0 02/26/2021   HDL 76.20 02/26/2021   LDLDIRECT 143.0 02/14/2019   LDLCALC 120 (H) 02/26/2021   ALT 16 02/26/2021   AST 18 02/26/2021   NA 137 02/26/2021   K 4.5 02/26/2021   CL 103 02/26/2021   CREATININE 0.85 02/26/2021   BUN 14 02/26/2021   CO2 30 02/26/2021   TSH 0.77 02/26/2021    BP Readings from Last 3 Encounters:  07/09/21 120/60  06/16/21 120/70  05/27/21 134/80    Lab results reviewed with patient   ASSESSMENT AND PLAN:  Discussed the following assessment and plan:    ICD-10-CM   1. Essential hypertension  I10     2. History of melanoma in situ  Z86.006     3. Medication management  Z79.899     4. Hyperlipidemia, unspecified hyperlipidemia type  E78.5     5. Seasonal allergic rhinitis due to pollen  J30.1     6. Thyroid nodule  E04.1      No follow-ups on file.  Patient Care Team: Birney Belshe, Standley Brooking, MD as PCP - General (Internal Medicine) Sydnee Cabal, MD (Orthopedic Surgery) amy Puccini  Linda Hedges, DO as Consulting Physician (Obstetrics and Gynecology) Daryll Brod, MD as Consulting Physician (Orthopedic Surgery) Johnathan Hausen, MD as Consulting Physician (General Surgery) Ayesha Mohair (Dermatology) There are no Patient Instructions on file for this visit.  Standley Brooking. Saifan Rayford [name]

## 2022-03-18 NOTE — Patient Instructions (Signed)
Good to see you today  BP is  in range  Will refill losartan . Get labs today at [facility] office lab .  Will review record   consider  diagnostics mammo for the inverted left nipple

## 2022-03-19 ENCOUNTER — Encounter: Payer: Self-pay | Admitting: Internal Medicine

## 2022-03-19 LAB — COMPREHENSIVE METABOLIC PANEL
ALT: 15 U/L (ref 0–35)
AST: 16 U/L (ref 0–37)
Albumin: 4.5 g/dL (ref 3.5–5.2)
Alkaline Phosphatase: 69 U/L (ref 39–117)
BUN: 17 mg/dL (ref 6–23)
CO2: 24 mEq/L (ref 19–32)
Calcium: 10.2 mg/dL (ref 8.4–10.5)
Chloride: 104 mEq/L (ref 96–112)
Creatinine, Ser: 0.79 mg/dL (ref 0.40–1.20)
GFR: 71.45 mL/min (ref >60.00)
Glucose, Bld: 94 mg/dL (ref 70–99)
Potassium: 4.3 mEq/L (ref 3.5–5.1)
Sodium: 140 mEq/L (ref 135–145)
Total Bilirubin: 0.5 mg/dL (ref 0.2–1.2)
Total Protein: 7.6 g/dL (ref 6.0–8.3)

## 2022-03-19 LAB — LIPID PANEL
Cholesterol: 254 mg/dL — ABNORMAL HIGH (ref 0–200)
HDL: 80.1 mg/dL (ref >39.00)
LDL Cholesterol: 153 mg/dL — ABNORMAL HIGH (ref 0–99)
NonHDL: 173.99
Total CHOL/HDL Ratio: 3
Triglycerides: 103 mg/dL (ref 0.0–149.0)
VLDL: 20.6 mg/dL (ref 0.0–40.0)

## 2022-03-19 NOTE — Progress Notes (Signed)
Cholesterol gong back  up from last year .  Attention  to healthy eating and activity  to get readings back down   ratio is  favorable.

## 2022-03-20 ENCOUNTER — Ambulatory Visit
Admission: RE | Admit: 2022-03-20 | Discharge: 2022-03-20 | Disposition: A | Discharge status: Home / Self Care | Payer: Medicare HMO | Source: Ambulatory Visit | Attending: Internal Medicine | Admitting: Internal Medicine

## 2022-03-20 DIAGNOSIS — E042 Nontoxic multinodular goiter: Secondary | ICD-10-CM

## 2022-03-24 NOTE — Progress Notes (Signed)
Form was faxed 

## 2022-04-21 ENCOUNTER — Ambulatory Visit: Payer: Medicare HMO | Admitting: Internal Medicine

## 2022-04-21 ENCOUNTER — Encounter: Payer: Self-pay | Admitting: Internal Medicine

## 2022-04-21 VITALS — BP 120/80 | HR 82 | Ht 63.8 in | Wt 141.0 lb

## 2022-04-21 DIAGNOSIS — E042 Nontoxic multinodular goiter: Secondary | ICD-10-CM | POA: Diagnosis not present

## 2022-04-21 NOTE — Progress Notes (Signed)
Patient ID: Sheila Livingston, female   DOB: April 15, 1945, 77 y.o.   MRN: [id number]  HPI  Sheila Livingston is a 77 y.o.-year-old female, referred by her PCP, Dr.Panosh, for evaluation for and management of thyroid nodules.  Last visit 1 year ago.  Interim history: She has no complaints at today's visit, other than IBS sxs. - her bowel movements are mostly regular, but occasionally, she has to have a bowel movement 1 to right after she eats.  She has a colonoscopy coming up.  Reviewed and addended history: She was found to have thyroid nodules after she complained of neck muscle pain at her most recent visit with PCP.  At that time, her thyroid was palpated and was found to be enlarged.  She was sent for thyroid ultrasound.  Thyroid U/S (03/01/2021): Several nodules, with the dominant left mid nodule measuring 2.7 x 1.3 x 2.1 cm, mixed solid/cystic, with punctate echogenic foci:  Parenchymal Echotexture: Moderately heterogenous  Isthmus: 0.3 cm  Right lobe: 5.7 x 1.6 x 1.9 cm  Left lobe: 6.0 x 1.9 x 2.5 cm  _________________________________________________________   Estimated total number of nodules >/= 1 cm: 5 _________________________________________________________   Nodule # 2:  Location: RIGHT; Mid  Maximum size: 1.4 cm; Other 2 dimensions: 1.0 x 1.1 cm  Composition: mixed cystic and solid (1)  This nodule does NOT meet TI-RADS criteria for biopsy or dedicated follow-up.  _________________________________________________________   Nodule # 3:  Location: RIGHT; Mid /to the RIGHT of the isthmus  Maximum size: 1.0 cm; Other 2 dimensions: 0.4 x 1.0 cm  Composition: solid/almost completely solid (2)  Echogenicity: hypoechoic (2) *Given size (>/= 1 - 1.4 cm) and appearance, a follow-up ultrasound in 1 year should be considered based on TI-RADS criteria.  _________________________________________________________   Nodule # 5:  Location: LEFT; Mid  Maximum size: 2.7 cm; Other 2 dimensions: 1.3 x  2.1 cm  Composition: mixed cystic and solid (1) Echogenic foci: punctate echogenic foci (3) **Given size (>/= 1.5 cm) and appearance, fine needle aspiration of this moderately suspicious nodule should be considered based on TI-RADS criteria.  _________________________________________________________   Nodule labeled #1 is spongiform and measures up to 1.0 cm, and nodule labeled #4 is cystic and measures up to 1.0 cm. These nodules do NOT meet TI-RADS criteria for biopsy or dedicated follow-up.   No cervical adenopathy or abnormal fluid collection within the imaged neck.     L lobe:   FNA (03/13/2021): Atypia of unknown significance; Afirma molecular analysis: benign  Thyroid U/S (03/21/2022): Parenchymal Echotexture: Moderately heterogeneous  Isthmus: 0.2 cm ,previously 0.3 cm  Right lobe: 6.5 x 2.0 x 2.0 cm ,previously 5.7 x 1.6 x 1.9 cm  Left lobe: 6.0 x 1.5 x 1.7 cm ,previously 6.0 x 1.9 x 2.5 cm  ________________________________________________________   Estimated total number of nodules >/= 1 cm: 4 _________________________________________________________   The previously visualized multiple thyroid nodules in the right hemi thyroid appear stable and benign. Each of these nodules is spongiform with the exception of right superior hypoechoic solid nodule which measures up to 0.8 cm, previously 1.0 cm.   Similar appearance of previously visualized, benign-appearing, mostly cystic nodule in the left superior thyroid (labeled 5, 1.2 cm, previously 1.0 cm).   Persistent resolution of the previously visualized cystic component from previously biopsied left mid thyroid nodule which now appears spongiform and measures up to 1.3 cm, unchanged.   Additional subcentimeter spongiform nodule is visualized just deep to the previously biopsied  nodule, also appearing benign and not warranting additional follow-up.   No cervical lymphadenopathy.   IMPRESSION: 1. Similar  appearing multinodular thyroid. 2. Similar appearance of previously biopsied in aspirated solid cystic nodule in the left mid thyroid (labeled 6, 1.3 cm, previously 2.7 cm). Recommend correlation with prior biopsy results. 3. The remaining visualized thyroid nodules bilaterally appear benign and do not warrant additional follow-up.      Pt denies: - feeling nodules in neck, but she does feel mild discomfort with turning head or looking down - hoarseness - dysphagia - choking  I reviewed pt's thyroid tests: Lab Results  Component Value Date   TSH 0.92 03/18/2022   TSH 0.77 02/26/2021   TSH 2.57 02/08/2020   TSH 0.90 02/03/2018   TSH 1.18 12/12/2014   TSH 1.40 10/27/2013   TSH 1.10 01/11/2013   TSH 1.30 01/11/2012   TSH 1.16 01/07/2011   TSH 0.79 01/03/2010   FREET4 0.83 02/26/2021   FREET4 0.81 12/12/2014   FREET4 1.0 10/29/2006    + FH of thyroid ds.: mother - ? Nodule  No FH of thyroid cancer. No h/o radiation tx to head or neck. No steroid use. No herbal supplements. No Biotin supplements or Hair, Skin and Nails vitamins.  Pt also has a history of melanoma >> had 1/2 R thumb removed. She may have IBS - prev. Dx'ed by Dr Altheimer. She also has a history of osteopenia per review of her bone density from 2021.  ROS: + See HPI   Past Medical History:  Diagnosis Date   Allergy    Anal fissure    Arthritis    hands   Benign paroxysmal positional vertigo    Complication of anesthesia    Endometriosis    Hemorrhoids    History of skin cancer    Hyperlipidemia    Hyperplastic colon polyp    Hypertension    IBS (irritable bowel syndrome)    Melanoma ([facility]) 06/10/2016   right thumb    Melanoma in situ ([facility]) 05/31/2016   Personal history of COVID-19    PONV (postoperative nausea and vomiting)    Past Surgical History:  Procedure Laterality Date   ABDOMINAL HYSTERECTOMY     bso enometriosis; no cancer   AMPUTATION Right 06/02/2016   Procedure: AMPUTATION RIGHT  THUMB INTERPHALANGEAL JOINT;  Surgeon: Daryll Brod, MD;  Location: [facility];  Service: Orthopedics;  Laterality: Right;   BUNIONECTOMY Left 11/23/2008   NAILBED REPAIR Right 05/12/2016   Procedure: EXCISION OF NAIL MATRIX BED RIGHT;  Surgeon: Daryll Brod, MD;  Location: [facility];  Service: Orthopedics;  Laterality: Right;   ROTATOR CUFF REPAIR Right 10/2009   SKIN BIOPSY  03/16/2017   shave biopsy, right inferior helix, chondrodermatits nodularis helicis   SKIN FULL THICKNESS GRAFT Right 05/12/2016   Procedure: UPPER ARM SKIN GRAFT RIGHT THUMB;  Surgeon: Daryll Brod, MD;  Location: [facility];  Service: Orthopedics;  Laterality: Right;   thumb nail surgery Right    Social History   Socioeconomic History   Marital status: Married    Spouse name: Not on file   Number of children: 0   Years of education: Not on file   Highest education level: Not on file  Occupational History    Comment: retired   Tobacco Use   Smoking status: Former   Smokeless tobacco: Never  Scientific laboratory technician Use: Never used  Substance and Sexual Activity   Alcohol use:  Yes    Alcohol/week: 0.0 standard drinks of alcohol    Comment: rare   Drug use: No   Sexual activity: Not on file  Other Topics Concern   Not on file  Social History Narrative   Widowed   Remarried 2015 2016   [facility]   Dog and Cat   6-7 hours of sleep   Mom passed away January 18, 2010   Working [employer]  41 - 45  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   Social Determinants of Health   Financial Resource Strain: Low Risk  (07/09/2021)   Overall Financial Resource Strain (CARDIA)    Difficulty of Paying Living Expenses: Not hard at all  Food Insecurity: No Food Insecurity (07/09/2021)   Hunger Vital Sign    Worried About Running Out of Food in the Last Year: Never true    Ran Out of Food in the Last Year: Never true   Transportation Needs: No Transportation Needs (07/09/2021)   PRAPARE - Hydrologist (Medical): No    Lack of Transportation (Non-Medical): No  Physical Activity: Sufficiently Active (07/09/2021)   Exercise Vital Sign    Days of Exercise per Week: 5 days    Minutes of Exercise per Session: 40 min  Stress: No Stress Concern Present (07/09/2021)   [place]    Feeling of Stress : Not at all  Social Connections: [place] (07/09/2021)   Social Connection and Isolation Panel [NHANES]    Frequency of Communication with Friends and Family: More than three times a week    Frequency of Social Gatherings with Friends and Family: More than three times a week    Attends Religious Services: More than 4 times per year    Active Member of [employer] or Organizations: Yes    Attends Music therapist: More than 4 times per year    Marital Status: Married  Human resources officer Violence: Not At Risk (07/09/2021)   Humiliation, Afraid, Rape, and Kick questionnaire    Fear of Current or Ex-Partner: No    Emotionally Abused: No    Physically Abused: No    Sexually Abused: No   Current Outpatient Medications on File Prior to Visit  Medication Sig Dispense Refill   aspirin 81 MG chewable tablet Chew 81 mg by mouth daily.     Cholecalciferol (VITAMIN D3) 400 UNITS CAPS Take 1 capsule by mouth daily.     hydrocortisone 2.5 % cream hydrocortisone 2.5 % topical cream     losartan (COZAAR) 50 MG tablet Take 1 tablet (50 mg total) by mouth daily. 90 tablet 3   LYSINE PO Take by mouth as needed.      MULTIPLE VITAMIN PO Take by mouth.     Omega-3 Fatty Acids (FISH OIL) 1000 MG CAPS      triamcinolone cream (KENALOG) 0.1 % Apply 1 application topically 2 (two) times daily.     TURMERIC PO Take 750 mg by mouth daily.     valACYclovir (VALTREX) 500 MG tablet Take 4 tablets (2,000 mg total) by mouth 2 (two)  times daily. Prn cold sores 60 tablet 2   No current facility-administered medications on file prior to visit.   Allergies  Allergen Reactions   Fluticasone Propionate Swelling and Other (See Comments)    Nose bleed  Loperamide Other (See Comments)    Passed out    Flonase [Fluticasone Propionate]    Fluticasone Propionate     REACTION: Facial swelling under eyes nose bleeds   Loperamide Hcl     REACTION: passed out   Loperamide Hcl    Other Other (See Comments)    Hummus? Swelling of face Hummus? Swelling of face   Benadryl [Diphenhydramine] Anxiety   Lisinopril Other (See Comments)    REACTION: dizziness and light headed   Family History  Problem Relation Age of Onset   Diabetes Mother    Stroke Mother    Breast cancer Sister    Colon polyps Sister    Diabetes Sister    Colon polyps Sister    Colon cancer Neg Hx    PE: BP 120/80 (BP Location: Left Arm, Patient Position: Sitting, Cuff Size: Small)   Pulse 82   Ht 5' 3.8" (1.621 m)   Wt 141 lb (64 kg)   SpO2 99%   BMI 24.35 kg/m  Wt Readings from Last 3 Encounters:  04/21/22 141 lb (64 kg)  03/18/22 141 lb (64 kg)  07/09/21 140 lb 6.4 oz (63.7 kg)   Constitutional: normal weight, in NAD Eyes: EOMI, no exophthalmos ENT: no thyromegaly but smooth nodule  palpated in the left lower neck, no cervical lymphadenopathy Cardiovascular: RRR, No MRG Respiratory: CTA B Musculoskeletal: no deformities except surgically removed date of right 1st finger Skin: no rashes Neurological: no tremor with outstretched hands  ASSESSMENT: 1. Thyroid nodules  PLAN: 1. Thyroid nodules -Patient with a history of a left dominant thyroid nodule which was quite large, but the majority of the nodule appears to be cystic.  She also has other thyroid nodules in the right lobe, however, none of these appears to be worrisome.  One of the nodules, in the isthmus, was recommended for 1 year follow-up. -We did review in the past fact that  her thyroid nodules did not have worrisome features: - solid hypoechogenicity (the nodules do appear to be hypoechoic, however, this is due to fluid component, which is not associated with increased malignancy) - presence of microcalcifications - presence of internal blood flow - taller-than-wide distribution - irregular contours She does not have a thyroid cancer family history or personal history of radiation therapy to head or neck to increase her personal risk of thyroid cancer -Her dominant thyroid nodule has been biopsied with inconclusive results: Atypia of unknown significance.  However, a sample was sent for Afirma molecular marker and this returned benign.  We discussed that in this case, the risk of cancer is very low, similar to benign biopsy -We discussed that thyroid cancer is a slow-growing cancer, with good prognosis which usually does not affect life expectancy or quality of life. -At last visit, she had very mild neck compression symptoms and we discussed that if these worsen, we can try to aspirate the fluid part of the nodule and if it recurs, we can try RFA, lobectomy or thyroidectomy.  She was not very bothered by the nodule at last visit to request surgery.  At today's visit, she has no significant neck compression symptoms. -Before today's visit, she contacted me so that I can order her thyroid ultrasound.  This was checked on 03/21/2022.  The majority of the nodules appear stable and benign.  The left dominant thyroid nodule was actually much decreased in size after the biopsy, from approximately 2.7 cm to 1.3 cm.  We discussed that this is excellent  news.  No intervention is needed for now. -I plan to see the patient back in 2 years  - Total time spent for the visit: 25 min, in precharting, postcharting, obtaining medical information from the chart and from the pt, reviewing her  previous labs, imaging evaluations, and treatments, reviewing her symptoms, counseling her about her  thyroid nodules (please see the discussed topics above), and developing a plan to further follow her nodules; she had a number of questions which I addressed.  Philemon Kingdom, MD PhD [facility]

## 2022-04-21 NOTE — Patient Instructions (Signed)
Please come back for a follow-up appointment in 2 years. 

## 2022-06-09 DIAGNOSIS — Z08 Encounter for follow-up examination after completed treatment for malignant neoplasm: Secondary | ICD-10-CM | POA: Diagnosis not present

## 2022-06-09 DIAGNOSIS — D225 Melanocytic nevi of trunk: Secondary | ICD-10-CM | POA: Diagnosis not present

## 2022-06-09 DIAGNOSIS — Z85820 Personal history of malignant melanoma of skin: Secondary | ICD-10-CM | POA: Diagnosis not present

## 2022-06-09 DIAGNOSIS — L408 Other psoriasis: Secondary | ICD-10-CM | POA: Diagnosis not present

## 2022-06-09 DIAGNOSIS — L814 Other melanin hyperpigmentation: Secondary | ICD-10-CM | POA: Diagnosis not present

## 2022-06-09 DIAGNOSIS — L821 Other seborrheic keratosis: Secondary | ICD-10-CM | POA: Diagnosis not present

## 2022-06-09 DIAGNOSIS — Z85828 Personal history of other malignant neoplasm of skin: Secondary | ICD-10-CM | POA: Diagnosis not present

## 2022-06-09 DIAGNOSIS — L2089 Other atopic dermatitis: Secondary | ICD-10-CM | POA: Diagnosis not present

## 2022-06-09 DIAGNOSIS — L910 Hypertrophic scar: Secondary | ICD-10-CM | POA: Diagnosis not present

## 2022-06-22 IMAGING — US US FNA BIOPSY THYROID 1ST LESION
1 series · 13 of 22 positions shown · non-contrast
Comparison: US Thyroid 03/01/21

MEDICATIONS:
None

COMPLICATIONS:
None immediate.

INDICATION: Indeterminate thyroid nodule

EXAM:
ULTRASOUND GUIDED FINE NEEDLE ASPIRATION OF INDETERMINATE THYROID
NODULE
TECHNIQUE: Informed written consent was obtained from the patient after a
discussion of the risks, benefits and alternatives to treatment.
Questions regarding the procedure were encouraged and answered. A
timeout was performed prior to the initiation of the procedure.

[Series 1: us fna biopsy thyroid 1st lesion · 0.04mm/px · 22 acquisitions, 13 frames shown]
[im 1/22]
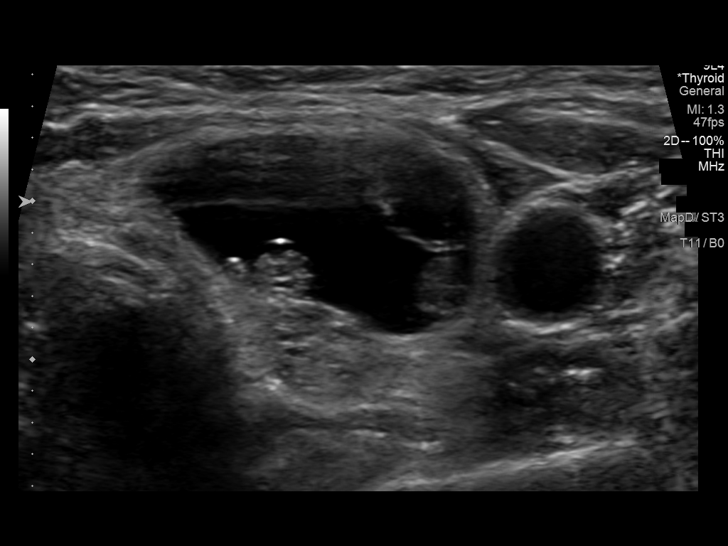
[im 3/22]
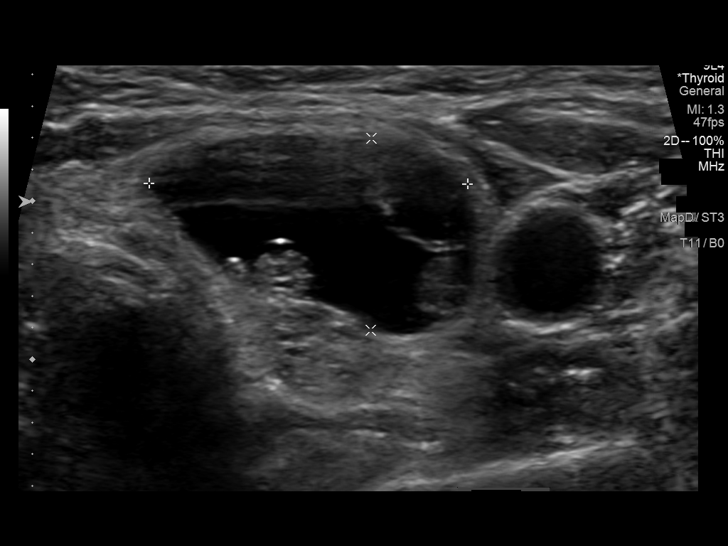
[im 5/22]
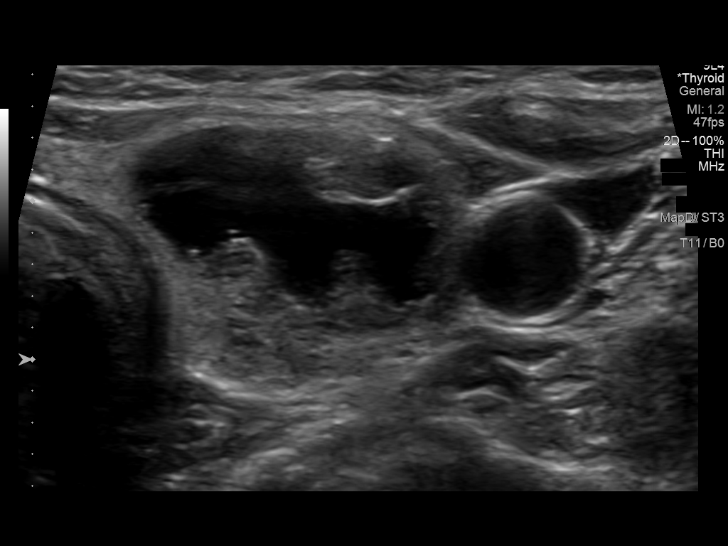
[im 6/22]
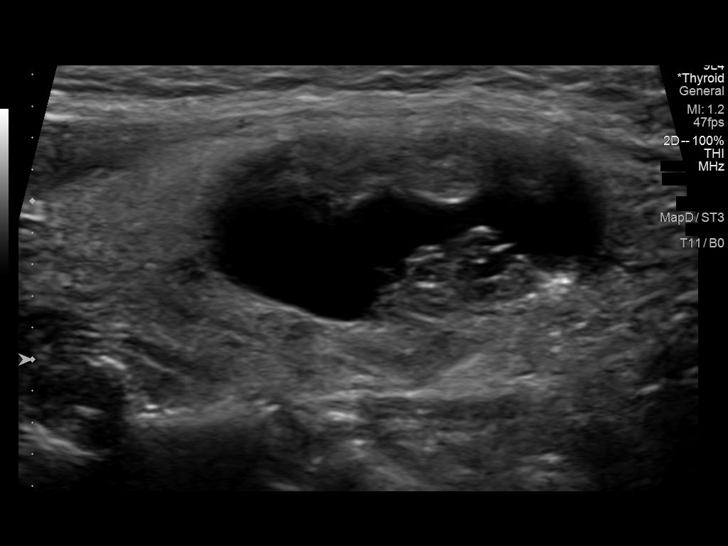
[im 8/22]
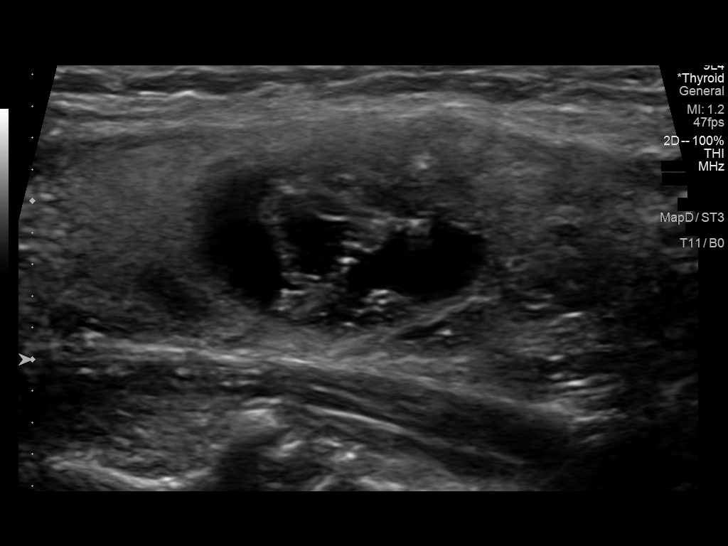
[im 10/22]
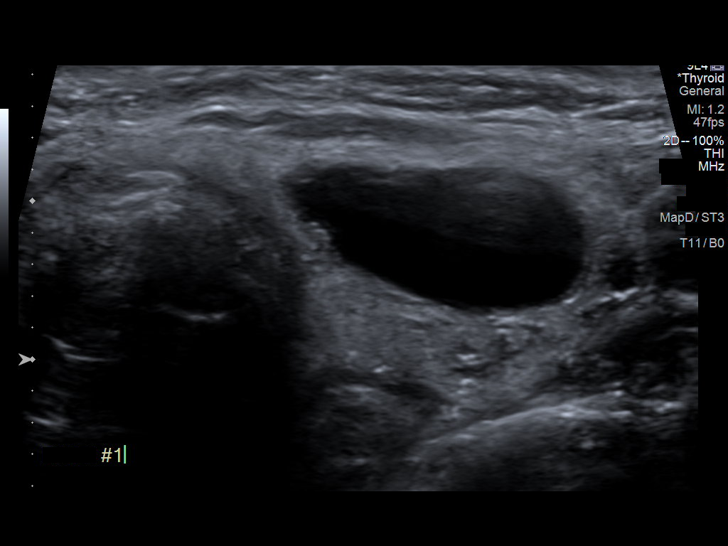
[im 12/22]
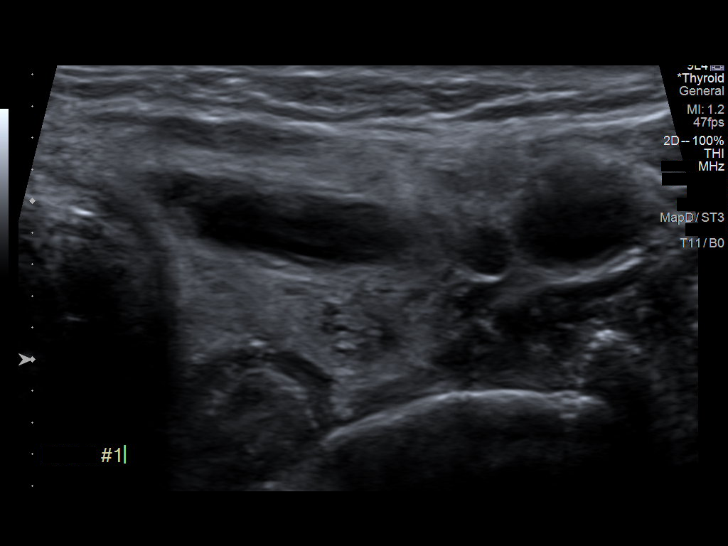
[im 13/22]
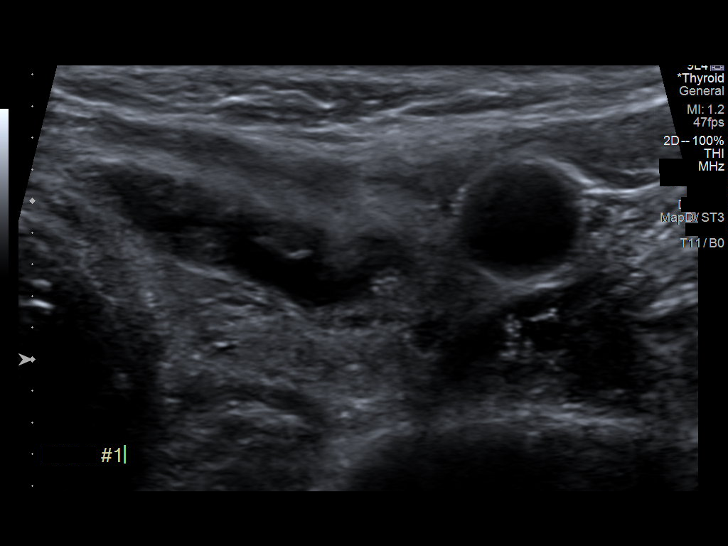
[im 15/22]
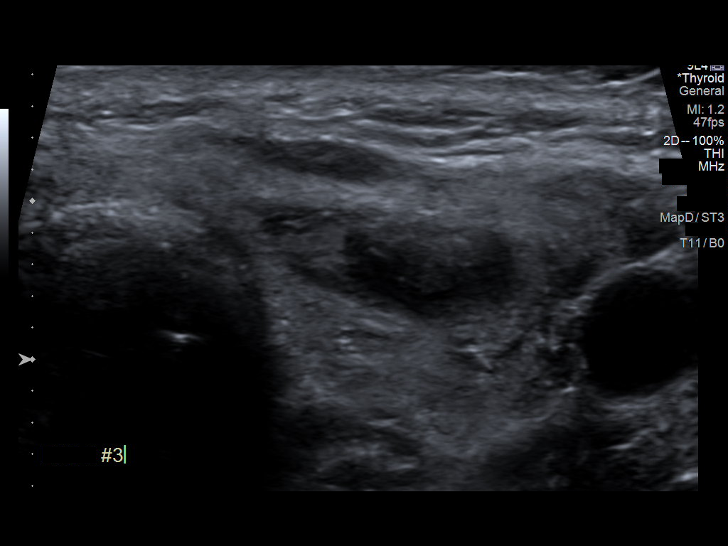
[im 17/22]
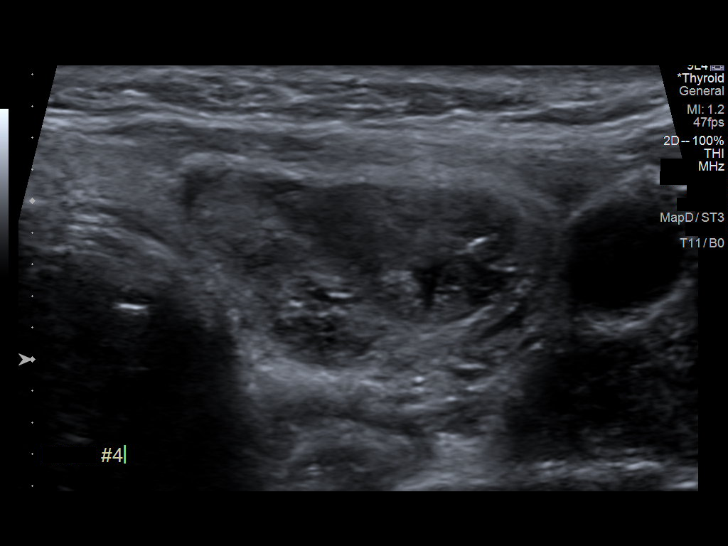
[im 18/22]
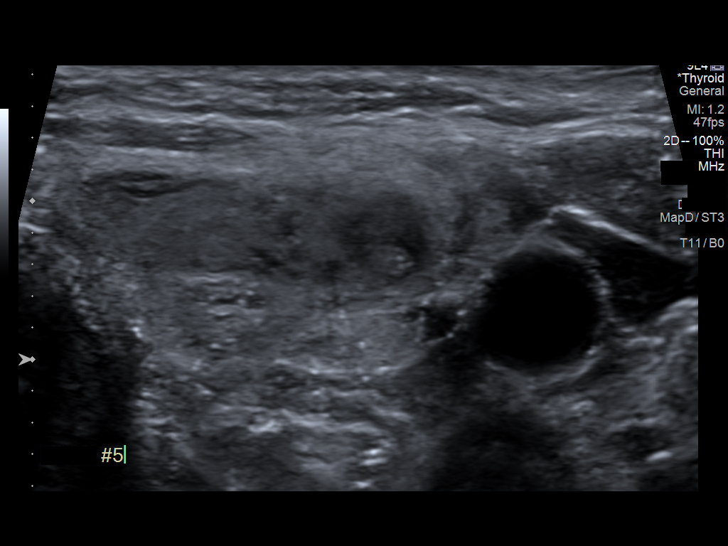
[im 20/22]
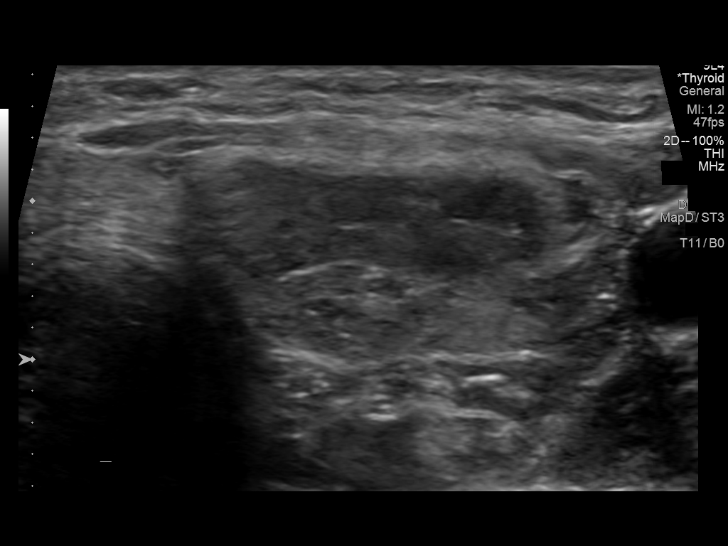
[im 22/22]
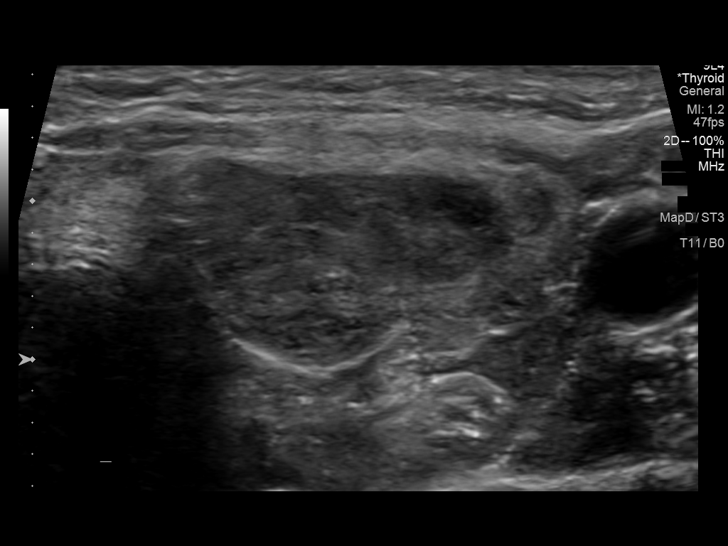

[13 of 22 positions shown; findings below may reference images not displayed]

Pre-procedural ultrasound scanning demonstrated unchanged size and
appearance of the indeterminate nodule within the left middle lobe.

The procedure was planned. The neck was prepped in the usual sterile
fashion, and a sterile drape was applied covering the operative
field. A timeout was performed prior to the initiation of the
procedure. Local anesthesia was provided with 1% lidocaine.

Under direct ultrasound guidance, 5 FNA biopsies were performed of
the nodule with a 27 gauge needle. Multiple ultrasound images were
saved for procedural documentation purposes. The samples were
prepared and submitted to pathology. Two of the specimens were
reserved for Afirma testing.

Limited post procedural scanning was negative for hematoma or
additional complication. Dressings were placed. The patient
tolerated the above procedures procedure well without immediate
postprocedural complication.
FINDINGS: Nodule reference number based on prior diagnostic ultrasound: 5

Maximum size: 2.7 cm

Location: Left; mid

ACR TI-RADS risk category: TR4 (4-6 points)

Reason for biopsy: meets ACR TI-RADS criteria

Ultrasound imaging confirms appropriate placement of the needles
within the thyroid nodule.
IMPRESSION: Technically successful ultrasound guided fine needle aspiration of
left mid thyroid nodule.

Performed and read by Moolman, Klpigbb

## 2022-07-04 ENCOUNTER — Encounter: Payer: Self-pay | Admitting: Internal Medicine

## 2022-07-06 ENCOUNTER — Other Ambulatory Visit: Payer: Self-pay | Admitting: Family

## 2022-07-06 DIAGNOSIS — Z780 Asymptomatic menopausal state: Secondary | ICD-10-CM

## 2022-07-08 ENCOUNTER — Telehealth: Payer: Self-pay | Admitting: Internal Medicine

## 2022-07-08 NOTE — Telephone Encounter (Signed)
Pt called in and stated Hyman Hopes placed some Bone Density orders in for her and she was wondering if we can fax those orders over to:   [facility] [address] [place], [place], [place] [address]  Phone: [phone] Fax: [phone]  They let pt know that they could perform those orders while she is being seen for her appt on 7/8. Please call pt once orders has been faxed. [phone]  Please advise.

## 2022-07-14 ENCOUNTER — Encounter: Payer: Self-pay | Admitting: Family Medicine

## 2022-07-14 ENCOUNTER — Ambulatory Visit (INDEPENDENT_AMBULATORY_CARE_PROVIDER_SITE_OTHER): Payer: Medicare HMO | Admitting: Family Medicine

## 2022-07-14 DIAGNOSIS — Z0000 Encounter for general adult medical examination without abnormal findings: Secondary | ICD-10-CM | POA: Diagnosis not present

## 2022-07-14 NOTE — Patient Instructions (Signed)
I really enjoyed getting to talk with you today! I am available on Tuesdays and Thursdays for virtual visits if you have any questions or concerns, or if I can be of any further assistance.   CHECKLIST FROM ANNUAL WELLNESS VISIT:  -Follow up (please call to schedule if not scheduled after visit):   -yearly for annual wellness visit with primary care office  Here is a list of your preventive care/health maintenance measures and the plan for each if any are due:  PLAN For any measures below that may be due:  -check with office in 1-2 days to ensure dexa orders faxed for [facility] -get dexa and mammogram -can get the covid and shingles vaccines a the pharmacy  Health Maintenance  Topic Date Due   COVID-19 Vaccine (1) 07/30/2022 (Originally 05/25/1950)   Zoster Vaccines- Shingrix (1 of 2) 10/14/2022 (Originally 05/26/1964)   INFLUENZA VACCINE  08/27/2022   Medicare Annual Wellness (AWV)  07/14/2023   Pneumonia Vaccine 59+ Years old  Completed   DEXA SCAN  Completed   Hepatitis C Screening  Completed   HPV VACCINES  Aged Out   DTaP/Tdap/Td  Discontinued   Colonoscopy  Discontinued    -See a dentist at least yearly  -Get your eyes checked and then per your eye specialist's recommendations  -Other issues addressed today:   -I have included below further information regarding a healthy whole foods based diet, physical activity guidelines for adults, stress management and opportunities for social connections. I hope you find this information useful.   -----------------------------------------------------------------------------------------------------------------------------------------------------------------------------------------------------------------------------------------------------------  NUTRITION: -eat real food: lots of colorful vegetables (half the plate) and fruits -5-7 servings of vegetables and fruits per day (fresh or steamed is best), exp. 2 servings of vegetables with  lunch and dinner and 2 servings of fruit per day. Berries and greens such as kale and collards are great choices.  -consume on a regular basis: whole grains (make sure first ingredient on label contains the word "whole"), fresh fruits, fish, nuts, seeds, healthy oils (such as olive oil, avocado oil, grape seed oil) -may eat small amounts of dairy and lean meat on occasion, but avoid processed meats such as ham, bacon, lunch meat, etc. -drink water -try to avoid fast food and pre-packaged foods, processed meat -most experts advise limiting sodium to < 2300mg  per day, should limit further is any chronic conditions such as high blood pressure, heart disease, diabetes, etc. The American Heart Association advised that < 1500mg  is is ideal -try to avoid foods that contain any ingredients with names you do not recognize  -try to avoid sugar/sweets (except for the natural sugar that occurs in fresh fruit) -try to avoid sweet drinks -try to avoid white rice, white bread, pasta (unless whole grain), white or yellow potatoes  EXERCISE GUIDELINES FOR ADULTS: -if you wish to increase your physical activity, do so gradually and with the approval of your doctor -STOP and seek medical care immediately if you have any chest pain, chest discomfort or trouble breathing when starting or increasing exercise  -move and stretch your body, legs, feet and arms when sitting for long periods -Physical activity guidelines for optimal health in adults: -least 150 minutes per week of aerobic exercise (can talk, but not sing) once approved by your doctor, 20-30 minutes of sustained activity or two 10 minute episodes of sustained activity every day.  -resistance training at least 2 days per week if approved by your doctor -balance exercises 3+ days per week:   Stand somewhere where  you have something sturdy to hold onto if you lose balance.    1) lift up on toes, start with 5x per day and work up to 20x   2) stand and lift on  leg straight out to the side so that foot is a few inches of the floor, start with 5x each side and work up to 20x each side   3) stand on one foot, start with 5 seconds each side and work up to 20 seconds on each side  If you need ideas or help with getting more active:  -Silver sneakers https://tools.silversneakers.com  -Walk with a Doc: http://www.duncan-williams.com/  -try to include resistance (weight lifting/strength building) and balance exercises twice per week: or the following link for ideas: http://castillo-powell.com/  BuyDucts.dk  STRESS MANAGEMENT: -can try meditating, or just sitting quietly with deep breathing while intentionally relaxing all parts of your body for 5 minutes daily -if you need further help with stress, anxiety or depression please follow up with your primary doctor or contact the wonderful folks at [facility]: [phone]  SOCIAL CONNECTIONS: -options in [place] if you wish to engage in more social and exercise related activities:  -Silver sneakers https://tools.silversneakers.com  -Walk with a Doc: http://www.duncan-williams.com/  -Check out the [facility] Active Adults 50+ section on the [place] of [employer] (hiking clubs, book clubs, cards and games, chess, exercise classes, aquatic classes and much more) - see the website for details: https://www.[place].gov/departments/parks-recreation/active-adults50  -YouTube has lots of exercise videos for different ages and abilities as well  -[facility] (a variety of indoor and outdoor inperson activities for adults). (580)[phone]. [address].  -Virtual Online Classes (a variety of topics): see seniorplanet.org or call [phone]  -consider volunteering at a school, hospice center, church, senior center or elsewhere

## 2022-07-14 NOTE — Progress Notes (Signed)
PATIENT CHECK-IN and HEALTH RISK ASSESSMENT QUESTIONNAIRE:  -completed by phone/video for upcoming Medicare Preventive Visit  Pre-Visit Check-in: 1)Vitals (height, wt, BP, etc) - record in vitals section for visit on day of visit 2)Review and Update Medications, Allergies PMH, Surgeries, Social history in Epic 3)Hospitalizations in the last year with date/reason? No   4)Review and Update Care Team (patient's specialists) in Epic 5) Complete PHQ9 in Epic  6) Complete Fall Screening in Epic 7)Review all Health Maintenance Due and order under PCP if not done.  8)Medicare Wellness Questionnaire: Answer theses question about your habits: Do you drink alcohol? Socially. If yes, how many drinks do you have a day? 1 Have you ever smoked? Yes Quit date if applicable? 1979  How many packs a day do/did you smoke? Close to 1 pack per day Do you use smokeless tobacco? No Do you use an illicit drugs? No Do you exercises? Yes IF so, what type and how many days/minutes per week? Walking 1-2 times daily , 30 minutes Are you sexually active? Yes Number of partners?1 Typical breakfast, eggs, toast, sometimes grits or shredded hashbrowns Typical lunch Not really Typical dinner Varies but normally ham sandwich Typical snacks: Ice cream, fruit  Beverages: Water, unsweet tea, coffee  Answer theses question about you: Can you perform most household chores? Yes Do you find it hard to follow a conversation in a noisy room? No Do you often ask people to speak up or repeat themselves? Yes, wears hearing aides Do you feel that you have a problem with memory? Not really Do you balance your checkbook and or bank acounts? No Do you feel safe at home? Yes Last dentist visit? 2 months ago Do you need assistance with any of the following: Please note if so No  Driving? No  Feeding yourself? No  Getting from bed to chair? No  Getting to the toilet?  No  Bathing or showering? No   Dressing yourself?  No  Managing money? No  Climbing a flight of stairs  No  Preparing meals? No  Do you have Advanced Directives in place (Living Will, Healthcare Power or Attorney)? Yes, both   Last eye Exam and location? September 2023, [facility]   Do you currently use prescribed or non-prescribed narcotic or opioid pain medications? No  Do you have a history or close family history of breast, ovarian, tubal or peritoneal cancer or a family member with BRCA (breast cancer susceptibility 1 and 2) gene mutations? Sister, Celine Ahr, Uncle, and Cousins; breast cancer  Nurse/Assistant Credentials/time stamp: BDS  07/14/22 5:08pm   ----------------------------------------------------------------------------------------------------------------------------------------------------------------------------------------------------------------------   MEDICARE ANNUAL PREVENTIVE VISIT WITH PROVIDER: (Welcome to [facility], initial annual wellness or annual wellness exam)  Virtual Visit via Phone Note  I connected with Sheila Livingston on 07/14/22 by phone and verified that I am speaking with the correct person using two identifiers.  Location patient: home Location provider:work or home office Persons participating in the virtual visit: patient, provider  Concerns and/or follow up today: none, no concerns   See HM section in Epic for other details of completed HM.    ROS: negative for report of fevers, unintentional weight loss, vision changes, vision loss, hearing loss or change, chest pain, sob, hemoptysis, melena, hematochezia, hematuria, falls, bleeding or bruising, thoughts of suicide or self harm, memory loss  Patient-completed extensive health risk assessment - reviewed and discussed with the patient: See Health Risk Assessment completed with patient prior to the visit either above or in recent phone  note. This was reviewed in detailed with the patient today and appropriate recommendations, orders  and referrals were placed as needed per Summary below and patient instructions.   Review of Medical History: -PMH, PSH, Family History and current specialty and care providers reviewed and updated and listed below   Patient Care Team: Panosh, Neta Mends, MD as PCP - General (Internal Medicine) Eugenia Mcalpine, MD (Orthopedic Surgery) amy Pharris  Mitchel Honour, DO as Consulting Physician (Obstetrics and Gynecology) Cindee Salt, MD as Consulting Physician (Orthopedic Surgery) Luretha Murphy, MD as Consulting Physician (General Surgery) Nettie Elm (Dermatology)   Past Medical History:  Diagnosis Date   Allergy    Anal fissure    Arthritis    hands   Benign paroxysmal positional vertigo    Complication of anesthesia    Endometriosis    Hemorrhoids    History of skin cancer    Hyperlipidemia    Hyperplastic colon polyp    Hypertension    IBS (irritable bowel syndrome)    Melanoma (HCC) 06/10/2016   right thumb    Melanoma in situ (HCC) 05/31/2016   Personal history of COVID-19    PONV (postoperative nausea and vomiting)     Past Surgical History:  Procedure Laterality Date   ABDOMINAL HYSTERECTOMY     bso enometriosis; no cancer   AMPUTATION Right 06/02/2016   Procedure: AMPUTATION RIGHT THUMB INTERPHALANGEAL JOINT;  Surgeon: Cindee Salt, MD;  Location: [facility];  Service: Orthopedics;  Laterality: Right;   BUNIONECTOMY Left 11/23/2008   NAILBED REPAIR Right 05/12/2016   Procedure: EXCISION OF NAIL MATRIX BED RIGHT;  Surgeon: Cindee Salt, MD;  Location: [facility];  Service: Orthopedics;  Laterality: Right;   ROTATOR CUFF REPAIR Right 10/2009   SKIN BIOPSY  03/16/2017   shave biopsy, right inferior helix, chondrodermatits nodularis helicis   SKIN FULL THICKNESS GRAFT Right 05/12/2016   Procedure: UPPER ARM SKIN GRAFT RIGHT THUMB;  Surgeon: Cindee Salt, MD;  Location: [facility];  Service: Orthopedics;  Laterality: Right;    thumb nail surgery Right     Social History   Socioeconomic History   Marital status: Married    Spouse name: Not on file   Number of children: 0   Years of education: Not on file   Highest education level: Not on file  Occupational History    Comment: retired   Tobacco Use   Smoking status: Former   Smokeless tobacco: Never  Building services engineer Use: Never used  Substance and Sexual Activity   Alcohol use: Yes    Alcohol/week: 0.0 standard drinks of alcohol    Comment: rare   Drug use: No   Sexual activity: Not on file  Other Topics Concern   Not on file  Social History Narrative   Widowed   Remarried 2015 2016   [facility] of 2   Dog and Cat   6-7 hours of sleep   Mom passed away 2009/12/25  Working [employer] jeans  38 - 43  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   Social Determinants of Health   Financial Resource Strain: Low Risk  (07/09/2021)   Overall Financial Resource Strain (CARDIA)    Difficulty of Paying Living Expenses: Not hard at all  Food Insecurity: No Food Insecurity (07/09/2021)   Hunger Vital Sign    Worried About  Running Out of Food in the Last Year: Never true    Ran Out of Food in the Last Year: Never true  Transportation Needs: No Transportation Needs (07/09/2021)   PRAPARE - Administrator, Civil Service (Medical): No    Lack of Transportation (Non-Medical): No  Physical Activity: Sufficiently Active (07/09/2021)   Exercise Vital Sign    Days of Exercise per Week: 5 days    Minutes of Exercise per Session: 40 min  Stress: No Stress Concern Present (07/09/2021)   [employer] of Occupational Health - Occupational Stress Questionnaire    Feeling of Stress : Not at all  Social Connections: Socially Integrated (07/09/2021)   Social Connection and Isolation Panel [NHANES]    Frequency of Communication with Friends and Family: More than three times a week    Frequency of Social  Gatherings with Friends and Family: More than three times a week    Attends Religious Services: More than 4 times per year    Active Member of [employer] or Organizations: Yes    Attends Engineer, structural: More than 4 times per year    Marital Status: Married  Catering manager Violence: Not At Risk (07/09/2021)   Humiliation, Afraid, Rape, and Kick questionnaire    Fear of Current or Ex-Partner: No    Emotionally Abused: No    Physically Abused: No    Sexually Abused: No    Family History  Problem Relation Age of Onset   Diabetes Mother    Stroke Mother    Breast cancer Sister    Colon polyps Sister    Diabetes Sister    Colon polyps Sister    Colon cancer Neg Hx     Current Outpatient Medications on File Prior to Visit  Medication Sig Dispense Refill   aspirin 81 MG chewable tablet Chew 81 mg by mouth daily.     Cholecalciferol (VITAMIN D3) 400 UNITS CAPS Take 1 capsule by mouth daily.     hydrocortisone 2.5 % cream hydrocortisone 2.5 % topical cream     losartan (COZAAR) 50 MG tablet Take 1 tablet (50 mg total) by mouth daily. 90 tablet 3   LYSINE PO Take by mouth as needed.      MULTIPLE VITAMIN PO Take by mouth.     Omega-3 Fatty Acids (FISH OIL) 1000 MG CAPS      triamcinolone cream (KENALOG) 0.1 % Apply 1 application topically 2 (two) times daily.     TURMERIC PO Take 750 mg by mouth daily.     valACYclovir (VALTREX) 500 MG tablet Take 4 tablets (2,000 mg total) by mouth 2 (two) times daily. Prn cold sores 60 tablet 2   No current facility-administered medications on file prior to visit.    Allergies  Allergen Reactions   Fluticasone Propionate Swelling and Other (See Comments)    Nose bleed    Loperamide Other (See Comments)    Passed out    Flonase [Fluticasone Propionate]    Fluticasone Propionate     REACTION: Facial swelling under eyes nose bleeds   Loperamide Hcl     REACTION: passed out   Loperamide Hcl    Other Other (See Comments)    Hummus?  Swelling of face Hummus? Swelling of face   Benadryl [Diphenhydramine] Anxiety   Lisinopril Other (See Comments)    REACTION: dizziness and light headed       Physical Exam There were no vitals filed for this visit. Estimated body mass  index is 24.35 kg/m as calculated from the following:   Height as of 04/21/22: 5' 3.8" (1.621 m).   Weight as of 04/21/22: 141 lb (64 kg).  EKG (optional): deferred due to virtual visit  GENERAL: alert, oriented, no acute distress detected, full vision exam deferred due to pandemic and/or virtual encounter  PSYCH/NEURO: pleasant and cooperative, no obvious depression or anxiety, speech and thought processing grossly intact, Cognitive function grossly intact  Flowsheet Row Office Visit from 03/18/2022 in [facility]  PHQ-9 Total Score 0           07/14/2022    5:00 PM 03/18/2022   11:46 AM 07/09/2021    2:22 PM 06/16/2021   11:05 AM 05/27/2021    1:22 PM  Depression screen PHQ 2/9  Decreased Interest 0 0 0 0 0  Down, Depressed, Hopeless 0 0 0 0 0  PHQ - 2 Score 0 0 0 0 0  Altered sleeping  0 0 0 0  Tired, decreased energy  0 0 0 0  Change in appetite  0 0 0 0  Feeling bad or failure about yourself   0 0 0 0  Trouble concentrating  0 0 0 0  Moving slowly or fidgety/restless  0 0 0 0  Suicidal thoughts  0 0 0 0  PHQ-9 Score  0 0 0 0  Difficult doing work/chores  Not difficult at all Not difficult at all Not difficult at all Not difficult at all       05/27/2021    1:22 PM 06/16/2021   11:04 AM 07/09/2021    2:25 PM 03/18/2022   11:46 AM 07/14/2022    5:00 PM  Fall Risk  Falls in the past year? 0 0 0 0 0  Was there an injury with Fall? 0 1 0 0 0  Fall Risk Category Calculator 0 1 0 0 0  Fall Risk Category (Retired) Low Low Low    (RETIRED) Patient Fall Risk Level Low fall risk Low fall risk Low fall risk    Patient at Risk for Falls Due to No Fall Risks No Fall Risks No Fall Risks No Fall Risks No Fall Risks   Fall risk Follow up Falls evaluation completed Falls evaluation completed  Falls evaluation completed Falls evaluation completed     SUMMARY AND PLAN:  Encounter for Medicare annual wellness exam   Discussed applicable health maintenance/preventive health measures and advised and referred or ordered per patient preferences: -discussed recs for the covid and the shingles vaccines, knows she can get them at the pharmacy and asked that she let us know when she does them so that we can update in the record -she has mammogram scheduled and plans to have dexa there as well, I can see where this was already ordered and message was sent to fax orders to [facility] already by Dr. Rosezella [place] team -sent message to her team to ensure done  Health Maintenance  Topic Date Due   COVID-19 Vaccine (1) 07/30/2022 (Originally 05/25/1950)   Zoster Vaccines- Shingrix (1 of 2) 10/14/2022 (Originally 05/26/1964)   INFLUENZA VACCINE  08/27/2022   Medicare Annual Wellness (AWV)  07/14/2023   Pneumonia Vaccine 68+ Years old  Completed   DEXA SCAN  Completed   Hepatitis C Screening  Completed   HPV VACCINES  Aged Out   DTaP/Tdap/Td  Discontinued   Colonoscopy  Discontinued    Education and counseling on the following was provided based on the above  review of health and a plan/checklist for the patient, along with additional information discussed, was provided for the patient in the patient instructions :   -Advised and counseled on a healthy lifestyle - including the importance of a healthy diet, regular physical activity, social connections and stress management. -Reviewed patient's current diet. Advised and counseled on a whole foods based healthy diet. A summary of a healthy diet was provided in the Patient Instructions.  -reviewed patient's current physical activity level and discussed exercise guidelines for adults. Discussed community resources and ideas for safe exercise at home to assist in meeting exercise  guideline recommendations in a safe and healthy way.  -Advise yearly dental visits at minimum and regular eye exams -Advised and counseled on alcohol safe limits  Follow up: see patient instructions     Patient Instructions  I really enjoyed getting to talk with you today! I am available on Tuesdays and Thursdays for virtual visits if you have any questions or concerns, or if I can be of any further assistance.   CHECKLIST FROM ANNUAL WELLNESS VISIT:  -Follow up (please call to schedule if not scheduled after visit):   -yearly for annual wellness visit with primary care office  Here is a list of your preventive care/health maintenance measures and the plan for each if any are due:  PLAN For any measures below that may be due:  -check with office in 1-2 days to ensure dexa orders faxed for [facility] -get dexa and mammogram -can get the covid and shingles vaccines a the pharmacy  Health Maintenance  Topic Date Due   COVID-19 Vaccine (1) 07/30/2022 (Originally 05/25/1950)   Zoster Vaccines- Shingrix (1 of 2) 10/14/2022 (Originally 05/26/1964)   INFLUENZA VACCINE  08/27/2022   Medicare Annual Wellness (AWV)  07/14/2023   Pneumonia Vaccine 59+ Years old  Completed   DEXA SCAN  Completed   Hepatitis C Screening  Completed   HPV VACCINES  Aged Out   DTaP/Tdap/Td  Discontinued   Colonoscopy  Discontinued    -See a dentist at least yearly  -Get your eyes checked and then per your eye specialist's recommendations  -Other issues addressed today:   -I have included below further information regarding a healthy whole foods based diet, physical activity guidelines for adults, stress management and opportunities for social connections. I hope you find this information useful.    -----------------------------------------------------------------------------------------------------------------------------------------------------------------------------------------------------------------------------------------------------------  NUTRITION: -eat real food: lots of colorful vegetables (half the plate) and fruits -5-7 servings of vegetables and fruits per day (fresh or steamed is best), exp. 2 servings of vegetables with lunch and dinner and 2 servings of fruit per day. Berries and greens such as kale and collards are great choices.  -consume on a regular basis: whole grains (make sure first ingredient on label contains the word "whole"), fresh fruits, fish, nuts, seeds, healthy oils (such as olive oil, avocado oil, grape seed oil) -may eat small amounts of dairy and lean meat on occasion, but avoid processed meats such as ham, bacon, lunch meat, etc. -drink water -try to avoid fast food and pre-packaged foods, processed meat -most experts advise limiting sodium to < 2300mg  per day, should limit further is any chronic conditions such as high blood pressure, heart disease, diabetes, etc. The American Heart Association advised that < 1500mg  is is ideal -try to avoid foods that contain any ingredients with names you do not recognize  -try to avoid sugar/sweets (except for the natural sugar that occurs in fresh fruit) -try to avoid  sweet drinks -try to avoid white rice, white bread, pasta (unless whole grain), white or yellow potatoes  EXERCISE GUIDELINES FOR ADULTS: -if you wish to increase your physical activity, do so gradually and with the approval of your doctor -STOP and seek medical care immediately if you have any chest pain, chest discomfort or trouble breathing when starting or increasing exercise  -move and stretch your body, legs, feet and arms when sitting for long periods -Physical activity guidelines for optimal health in adults: -least 150 minutes per week of  aerobic exercise (can talk, but not sing) once approved by your doctor, 20-30 minutes of sustained activity or two 10 minute episodes of sustained activity every day.  -resistance training at least 2 days per week if approved by your doctor -balance exercises 3+ days per week:   Stand somewhere where you have something sturdy to hold onto if you lose balance.    1) lift up on toes, start with 5x per day and work up to 20x   2) stand and lift on leg straight out to the side so that foot is a few inches of the floor, start with 5x each side and work up to 20x each side   3) stand on one foot, start with 5 seconds each side and work up to 20 seconds on each side  If you need ideas or help with getting more active:  -Silver sneakers https://tools.silversneakers.com  -Walk with a Doc: http://www.duncan-williams.com/  -try to include resistance (weight lifting/strength building) and balance exercises twice per week: or the following link for ideas: http://castillo-powell.com/  BuyDucts.dk  STRESS MANAGEMENT: -can try meditating, or just sitting quietly with deep breathing while intentionally relaxing all parts of your body for 5 minutes daily -if you need further help with stress, anxiety or depression please follow up with your primary doctor or contact the wonderful folks at [facility]: [phone]  SOCIAL CONNECTIONS: -options in [place] if you wish to engage in more social and exercise related activities:  -Silver sneakers https://tools.silversneakers.com  -Walk with a Doc: http://www.duncan-williams.com/  -Check out the [facility] Active Adults 50+ section on the [place] of [employer] (hiking clubs, book clubs, cards and games, chess, exercise classes, aquatic classes and much more) - see the website for  details: https://www.[place].gov/departments/parks-recreation/active-adults50  -YouTube has lots of exercise videos for different ages and abilities as well  -[facility] (a variety of indoor and outdoor inperson activities for adults). 712-816-4464. [address]  -Virtual Online Classes (a variety of topics): see seniorplanet.org or call (208) [phone]  -consider volunteering at a school, hospice center, church, senior center or elsewhere           Terressa Koyanagi, DO

## 2022-07-22 NOTE — Telephone Encounter (Signed)
Order was faxed to [facility]. Received a confirmation.   Spoke to pt and made her aware that it was sent. Someone should contact her to schedule an appt or she can contact the place. Verbalized understanding.

## 2022-07-22 NOTE — Telephone Encounter (Signed)
-----   Message from Terressa Koyanagi, DO sent at 07/14/2022  6:05 PM EDT ----- Regarding: bone density order Spoke with patient and she wanted to check to ensure bone density orders were faxed to [facility]? They were ordered to [facility] a few days ago - but she prefers to do at [facility] when she has her mammogram. Thanks! Please let her know if done.   Dahlia Client

## 2022-08-04 DIAGNOSIS — Z8262 Family history of osteoporosis: Secondary | ICD-10-CM | POA: Diagnosis not present

## 2022-08-04 DIAGNOSIS — M8588 Other specified disorders of bone density and structure, other site: Secondary | ICD-10-CM | POA: Diagnosis not present

## 2022-08-04 DIAGNOSIS — Z85828 Personal history of other malignant neoplasm of skin: Secondary | ICD-10-CM | POA: Diagnosis not present

## 2022-08-04 LAB — HM DEXA SCAN

## 2022-08-05 ENCOUNTER — Encounter: Payer: Self-pay | Admitting: Internal Medicine

## 2022-08-13 DIAGNOSIS — M810 Age-related osteoporosis without current pathological fracture: Secondary | ICD-10-CM | POA: Diagnosis not present

## 2022-08-13 DIAGNOSIS — Z01419 Encounter for gynecological examination (general) (routine) without abnormal findings: Secondary | ICD-10-CM | POA: Diagnosis not present

## 2022-08-13 DIAGNOSIS — Z6824 Body mass index (BMI) 24.0-24.9, adult: Secondary | ICD-10-CM | POA: Diagnosis not present

## 2022-08-14 DIAGNOSIS — N6453 Retraction of nipple: Secondary | ICD-10-CM | POA: Diagnosis not present

## 2022-08-14 DIAGNOSIS — R92333 Mammographic heterogeneous density, bilateral breasts: Secondary | ICD-10-CM | POA: Diagnosis not present

## 2022-08-14 DIAGNOSIS — R92323 Mammographic fibroglandular density, bilateral breasts: Secondary | ICD-10-CM | POA: Diagnosis not present

## 2022-08-14 LAB — HM MAMMOGRAPHY

## 2022-08-20 NOTE — Progress Notes (Signed)
Dexa shows t score stable of lsight improved   osteopenia at multiple sites  suggest   repeat in   2 years

## 2022-09-25 IMAGING — DX DG CHEST 2V
2 series · 2 of 2 positions shown · non-contrast
Comparison: 05/27/2021, 10/29/2006

CLINICAL DATA: Follow-up pneumonia

EXAM:
CHEST - 2 VIEW

[chest pa]
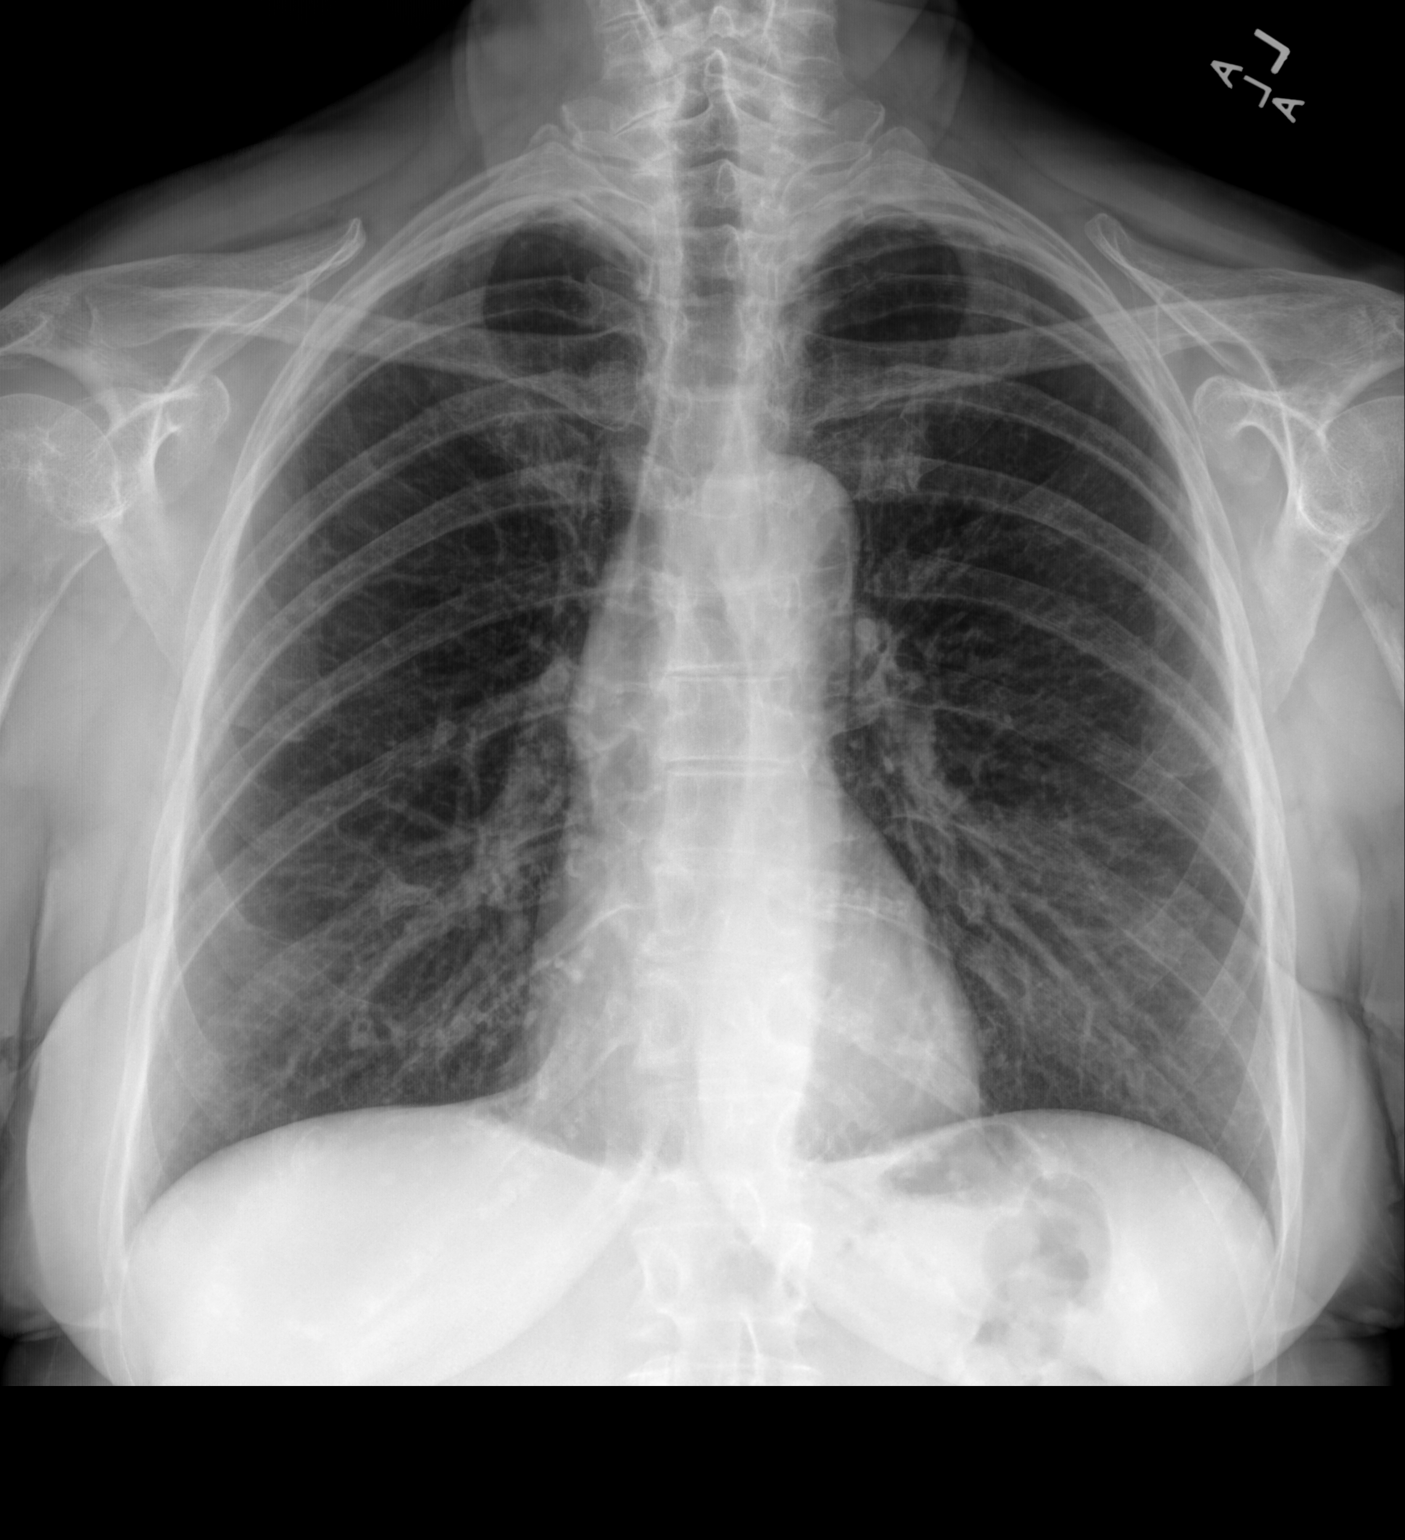

[chest lat]
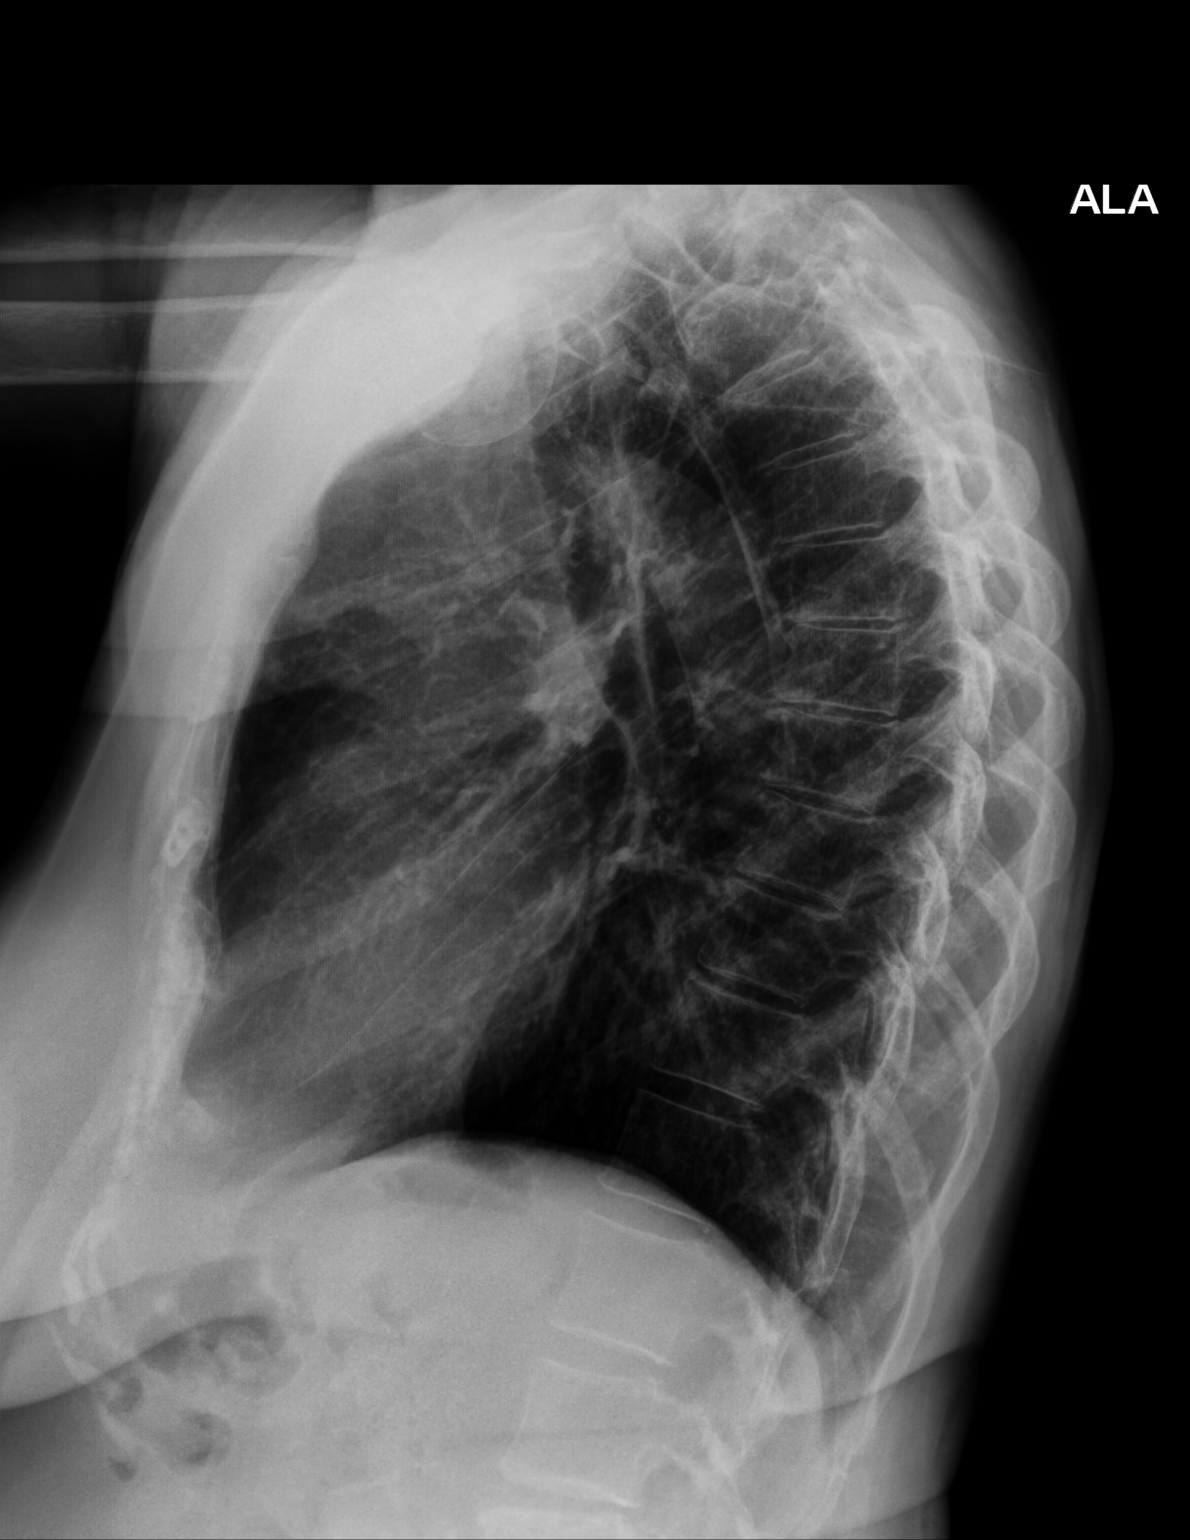

[2 of 2 positions shown; findings below may reference images not displayed]

FINDINGS: The heart size and mediastinal contours are within normal limits.
Both lungs are clear. The visualized skeletal structures are
unremarkable. Previously noted right upper lobe opacity/pneumonia
has resolved
IMPRESSION: No active cardiopulmonary disease.

## 2022-10-27 DIAGNOSIS — H524 Presbyopia: Secondary | ICD-10-CM | POA: Diagnosis not present

## 2022-11-03 DIAGNOSIS — L820 Inflamed seborrheic keratosis: Secondary | ICD-10-CM | POA: Diagnosis not present

## 2022-11-11 DIAGNOSIS — L600 Ingrowing nail: Secondary | ICD-10-CM | POA: Diagnosis not present

## 2022-11-11 DIAGNOSIS — L02611 Cutaneous abscess of right foot: Secondary | ICD-10-CM | POA: Diagnosis not present

## 2022-11-11 DIAGNOSIS — L03031 Cellulitis of right toe: Secondary | ICD-10-CM | POA: Diagnosis not present

## 2022-11-11 DIAGNOSIS — M205X2 Other deformities of toe(s) (acquired), left foot: Secondary | ICD-10-CM | POA: Diagnosis not present

## 2022-11-11 DIAGNOSIS — I70203 Unspecified atherosclerosis of native arteries of extremities, bilateral legs: Secondary | ICD-10-CM | POA: Diagnosis not present

## 2022-11-20 ENCOUNTER — Ambulatory Visit: Payer: Medicare HMO | Admitting: Gastroenterology

## 2022-11-20 ENCOUNTER — Encounter: Payer: Self-pay | Admitting: Gastroenterology

## 2022-11-20 VITALS — BP 122/70 | HR 64 | Ht 63.25 in | Wt 140.1 lb

## 2022-11-20 DIAGNOSIS — Z1211 Encounter for screening for malignant neoplasm of colon: Secondary | ICD-10-CM

## 2022-11-20 DIAGNOSIS — R152 Fecal urgency: Secondary | ICD-10-CM

## 2022-11-20 NOTE — Progress Notes (Unsigned)
HPI : Sheila Livingston is a 77 y.o. female who is referred to [place] by Panosh, Neta Mends, MD for consideration of screening colonoscopy.  She had a colonoscopy by Dr. Juanda Chance in 2014 in which a small sigmoid hyperplastic polyp was removed.  Colonoscopy in 2005 was reportedly normal (report not available).  She has no family history of colon cancer.  She has regular bowel movements, typically 1 stool per day with formed, solid stool.  Sometimes she will have a second stool, which is often looser consistency.  She used to have issues with constipation, but this has not been a problem for some time. For the past few months, she is started having some fecal urgency.  She often has to rush to the bathroom to get there in time.  She has not had any episodes of incontinence or seepage.  No nocturnal bowel movements or incontinence.  This symptom has not had a significant impact on her daily activities. She denies any blood in her stool.  Her weight is stable.  No problems with abdominal pain. She does take psyllium daily in the mornings.  She states that she took Imodium once and passed out.  She did not have any allergic type symptoms such as rash/hives, shortness of breath, palpitations, abdominal pain/nausea/vomiting  She is still quite active, and walks daily.  She has no known cardiopulmonary comorbidities.     Colonoscopy July 2014 (Dr. Juanda Chance) Single small sigmoid polyp (hyperplastic) - Internal hemorrhoids  Colonoscopy 2005 Annotated as normal, no report available  Past Medical History:  Diagnosis Date   Allergy    Anal fissure    Arthritis    hands   Benign paroxysmal positional vertigo    Complication of anesthesia    Endometriosis    Hemorrhoids    History of skin cancer    Hyperlipidemia    Hyperplastic colon polyp    Hypertension    IBS (irritable bowel syndrome)    Melanoma (HCC) 06/10/2016   right thumb    Melanoma in situ (HCC) 05/31/2016   Personal history of COVID-19    PONV  (postoperative nausea and vomiting)      Past Surgical History:  Procedure Laterality Date   ABDOMINAL HYSTERECTOMY     bso enometriosis; no cancer   AMPUTATION Right 06/02/2016   Procedure: AMPUTATION RIGHT THUMB INTERPHALANGEAL JOINT;  Surgeon: Cindee Salt, MD;  Location: [facility];  Service: Orthopedics;  Laterality: Right;   BUNIONECTOMY Left 11/23/2008   NAILBED REPAIR Right 05/12/2016   Procedure: EXCISION OF NAIL MATRIX BED RIGHT;  Surgeon: Cindee Salt, MD;  Location: [facility];  Service: Orthopedics;  Laterality: Right;   ROTATOR CUFF REPAIR Right 10/2009   SKIN BIOPSY  03/16/2017   shave biopsy, right inferior helix, chondrodermatits nodularis helicis   SKIN FULL THICKNESS GRAFT Right 05/12/2016   Procedure: UPPER ARM SKIN GRAFT RIGHT THUMB;  Surgeon: Cindee Salt, MD;  Location: [facility];  Service: Orthopedics;  Laterality: Right;   thumb nail surgery Right    Family History  Problem Relation Age of Onset   Diabetes Mother    Stroke Mother    Breast cancer Sister    Colon polyps Sister    Diabetes Sister    Colon polyps Sister    Colon cancer Neg Hx    Social History   Tobacco Use   Smoking status: Former   Smokeless tobacco: Never  Vaping Use   Vaping status: Never Used  Substance Use Topics   Alcohol use: Yes    Alcohol/week: 0.0 standard drinks of alcohol    Comment: rare   Drug use: No   Current Outpatient Medications  Medication Sig Dispense Refill   aspirin 81 MG chewable tablet Chew 81 mg by mouth daily.     Cholecalciferol (VITAMIN D3) 400 UNITS CAPS Take 1 capsule by mouth daily.     hydrocortisone 2.5 % cream hydrocortisone 2.5 % topical cream     losartan (COZAAR) 50 MG tablet Take 1 tablet (50 mg total) by mouth daily. 90 tablet 3   LYSINE PO Take by mouth as needed.      MULTIPLE VITAMIN PO Take by mouth.     Omega-3 Fatty Acids (FISH OIL) 1000 MG CAPS      triamcinolone cream (KENALOG) 0.1 % Apply 1  application topically 2 (two) times daily.     TURMERIC PO Take 750 mg by mouth daily.     valACYclovir (VALTREX) 500 MG tablet Take 4 tablets (2,000 mg total) by mouth 2 (two) times daily. Prn cold sores 60 tablet 2   No current facility-administered medications for this visit.   Allergies  Allergen Reactions   Fluticasone Propionate Swelling and Other (See Comments)    Nose bleed    Loperamide Other (See Comments)    Passed out    Flonase [Fluticasone Propionate]    Fluticasone Propionate     REACTION: Facial swelling under eyes nose bleeds   Loperamide Hcl     REACTION: passed out   Loperamide Hcl    Other Other (See Comments)    Hummus? Swelling of face Hummus? Swelling of face   Benadryl [Diphenhydramine] Anxiety   Lisinopril Other (See Comments)    REACTION: dizziness and light headed     Review of Systems: All systems reviewed and negative except where noted in HPI.    No results found.  Physical Exam: BP 122/70 (BP Location: Left Arm, Patient Position: Sitting, Cuff Size: Normal)   Pulse 64   Ht 5' 3.25" (1.607 m) Comment: height measured without shoes  Wt 140 lb 2 oz (63.6 kg)   BMI 24.63 kg/m  Constitutional: Pleasant,well-developed, Caucasian female in no acute distress. HEENT: Normocephalic and atraumatic. Conjunctivae are normal. No scleral icterus. Neck supple.  Cardiovascular: Normal rate, regular rhythm.  Pulmonary/chest: Effort normal and breath sounds normal. No wheezing, rales or rhonchi. Abdominal: Soft, nondistended, nontender. Bowel sounds active throughout. There are no masses palpable. No hepatomegaly. Extremities: no edema Rectal: Deferred Neurological: Alert and oriented to person place and time. Skin: Skin is warm and dry. No rashes noted. Psychiatric: Normal mood and affect. Behavior is normal.  CBC    Component Value Date/Time   WBC 7.2 03/18/2022 1255   RBC 4.33 03/18/2022 1255   HGB 13.5 03/18/2022 1255   HCT 39.4 03/18/2022  1255   PLT 258.0 03/18/2022 1255   MCV 90.9 03/18/2022 1255   MCHC 34.3 03/18/2022 1255   RDW 13.5 03/18/2022 1255   LYMPHSABS 2.6 03/18/2022 1255   MONOABS 0.5 03/18/2022 1255   EOSABS 0.1 03/18/2022 1255   BASOSABS 0.0 03/18/2022 1255    CMP     Component Value Date/Time   NA 140 03/18/2022 1255   K 4.3 03/18/2022 1255   CL 104 03/18/2022 1255   CO2 24 03/18/2022 1255   GLUCOSE 94 03/18/2022 1255   GLUCOSE 91 12/15/2005 0930   BUN 17 03/18/2022 1255   CREATININE 0.79 03/18/2022 1255  CALCIUM 10.2 03/18/2022 1255   PROT 7.6 03/18/2022 1255   ALBUMIN 4.5 03/18/2022 1255   AST 16 03/18/2022 1255   ALT 15 03/18/2022 1255   ALKPHOS 69 03/18/2022 1255   BILITOT 0.5 03/18/2022 1255   GFRNONAA >60 08/18/2010 1331   GFRAA >60 08/18/2010 1331       Latest Ref Rng & Units 03/18/2022   12:55 PM 05/27/2021    2:01 PM 02/26/2021   11:58 AM  CBC EXTENDED  WBC 4.0 - 10.5 K/uL 7.2  10.6  6.1   RBC 3.87 - 5.11 Mil/uL 4.33  3.99  4.25   Hemoglobin 12.0 - 15.0 g/dL 08.6  57.8  46.9   HCT 36.0 - 46.0 % 39.4  36.3  39.0   Platelets 150.0 - 400.0 K/uL 258.0  308.0  230.0   NEUT# 1.4 - 7.7 K/uL 3.9  7.1  3.1   Lymph# 0.7 - 4.0 K/uL 2.6  2.5  2.4       ASSESSMENT AND PLAN: 77 year old female referred for screening colonoscopy.  Her last colonoscopy was 10 years ago.  She has no known history of polyps.  She has no family history of colon cancer.  She has no concerning GI symptoms.  I had a discussion with the patient regarding ongoing colon cancer screening after age 61, and how this is performed on a case-by-case basis, taking into account the patient's comorbidities, life expectancy and relative risk of colon cancer.  The patient is in very good health, and recently has a life expectancy of 10 years or more.  Based on the absence of previous colon polyps and other risk factors colon cancer, she is very low risk for colon cancer.  I told the patient would be reasonable to pursue further  colon cancer screening.  She would be a reasonable candidate for stool based testing as well, given the absence of symptoms.  The patient opted for a stool based test, rather than another colonoscopy.  She would be reasonable to do another colonoscopy if the stool-based test was positive.  Will place order for Cologuard.  Regarding her fecal urgency, this is most likely secondary to age-related muscle atrophy/weakness of the sphincter.  She is not having any incontinence and no major changes in her stool habits/stool consistency. It is very unlikely that the patient experienced a syncopal episode directly related to loperamide for many years ago, and I think it would be reasonable for her to try taking it again if she is going to be in a situation where she may not have good bathroom access. We discussed the role of pelvic floor physical therapy and helping patients with fecal urgency or incontinence.  Given the mild nature of her symptoms, she preferred not to undergo any additional evaluation or testing at this time.  Colon cancer screening - Cologuard  Fecal urgency - Hold off on PT for now - Reconsider as needed usage of loperamide given very low likelihood of this causing syncope  Deakin Lacek E. Tomasa Rand, MD [facility]   Panosh, Neta Mends, MD

## 2022-11-20 NOTE — Patient Instructions (Signed)
Your provider has ordered Cologuard testing as an option for colon cancer screening. This is performed by [employer] [facility] and may be out of network with your insurance. PRIOR to completing the test, it is YOUR responsibility to contact your insurance about covered benefits for this test. Your out of pocket expense could be anywhere from $0.00 to $649.00.   When you call to check coverage with your insurer, please provide the following information:   -The ONLY provider of Cologuard is [facility], dispensing  - CPT code for Cologuard is [phone].  [facility] NPI # [id number]  -[employer] Tax ID # [id number]   We have already sent your demographic and insurance information to [employer] (phone number [phone]) and they should contact you within the next week regarding your test. If you have not heard from them within the next week, please call our office at [phone].  _______________________________________________________  If your blood pressure at your visit was 140/90 or greater, please contact your primary care physician to follow up on this.  _______________________________________________________  If you are age 24 or older, your body mass index should be between 23-30. Your Body mass index is 24.63 kg/m. If this is out of the aforementioned range listed, please consider follow up with your Primary Care Provider.  If you are age 2 or younger, your body mass index should be between 19-25. Your Body mass index is 24.63 kg/m. If this is out of the aformentioned range listed, please consider follow up with your Primary Care Provider.   ________________________________________________________  The [facility] providers would like to encourage you to use [facility] to communicate with providers for non-urgent requests or questions.  Due to long hold times on the telephone, sending your provider a message by [facility] may be a faster and more efficient  way to get a response.  Please allow 48 business hours for a response.  Please remember that this is for non-urgent requests.  _______________________________________________________

## 2022-11-25 DIAGNOSIS — Z1211 Encounter for screening for malignant neoplasm of colon: Secondary | ICD-10-CM | POA: Diagnosis not present

## 2022-11-26 DIAGNOSIS — L02611 Cutaneous abscess of right foot: Secondary | ICD-10-CM | POA: Diagnosis not present

## 2022-11-26 DIAGNOSIS — I70203 Unspecified atherosclerosis of native arteries of extremities, bilateral legs: Secondary | ICD-10-CM | POA: Diagnosis not present

## 2022-11-26 DIAGNOSIS — M205X2 Other deformities of toe(s) (acquired), left foot: Secondary | ICD-10-CM | POA: Diagnosis not present

## 2022-11-26 DIAGNOSIS — L600 Ingrowing nail: Secondary | ICD-10-CM | POA: Diagnosis not present

## 2022-11-26 DIAGNOSIS — L03031 Cellulitis of right toe: Secondary | ICD-10-CM | POA: Diagnosis not present

## 2022-12-01 LAB — COLOGUARD: COLOGUARD: NEGATIVE

## 2022-12-03 NOTE — Progress Notes (Signed)
Sheila Livingston, Your Cologuard test was negative.  There is no need for a colonoscopy at this time.  I would recommend you consider another Cologuard in 3 years.  It would also be reasonable to stop colon cancer screening at this time.

## 2022-12-11 DIAGNOSIS — M792 Neuralgia and neuritis, unspecified: Secondary | ICD-10-CM | POA: Diagnosis not present

## 2022-12-11 DIAGNOSIS — L600 Ingrowing nail: Secondary | ICD-10-CM | POA: Diagnosis not present

## 2023-01-03 ENCOUNTER — Other Ambulatory Visit: Payer: Self-pay | Admitting: Internal Medicine

## 2023-03-23 ENCOUNTER — Encounter: Payer: Medicare HMO | Admitting: Internal Medicine

## 2023-03-31 ENCOUNTER — Encounter: Payer: Medicare HMO | Admitting: Internal Medicine

## 2023-04-02 ENCOUNTER — Encounter: Payer: Self-pay | Admitting: Internal Medicine

## 2023-04-02 ENCOUNTER — Ambulatory Visit: Payer: Medicare HMO | Admitting: Internal Medicine

## 2023-04-02 VITALS — BP 120/70 | HR 86 | Ht 63.25 in | Wt 143.0 lb

## 2023-04-02 DIAGNOSIS — E042 Nontoxic multinodular goiter: Secondary | ICD-10-CM

## 2023-04-02 DIAGNOSIS — R1310 Dysphagia, unspecified: Secondary | ICD-10-CM

## 2023-04-02 NOTE — Progress Notes (Signed)
 Patient ID: Sheila Livingston, female   DOB: 11/30/1945, 78 y.o.   MRN: [id number]  HPI  Sheila Livingston is a 78 y.o.-year-old female, referred by her PCP, Dr.Panosh, for evaluation for and management of thyroid nodules.  Last visit 1 year ago.  Interim history: At today's visit, she has no hoarseness, choking, but does report occasional dysphagia.   She also mentions that she has a site of pain in her anterior jaw which her dentist connected to the previous root canal.  She may need to have the tooth extracted.  She is reticent to do so.  Reviewed and addended history: She was found to have thyroid nodules after she complained of neck muscle pain at her visit with PCP in 2023.  At that time, her thyroid was palpated and was found to be enlarged.  She was sent for thyroid ultrasound.  Thyroid U/S (03/01/2021): Several nodules, with the dominant left mid nodule measuring 2.7 x 1.3 x 2.1 cm, mixed solid/cystic, with punctate echogenic foci:  Parenchymal Echotexture: Moderately heterogenous  Isthmus: 0.3 cm  Right lobe: 5.7 x 1.6 x 1.9 cm  Left lobe: 6.0 x 1.9 x 2.5 cm  _________________________________________________________   Estimated total number of nodules >/= 1 cm: 5 _________________________________________________________   Nodule # 2:  Location: RIGHT; Mid  Maximum size: 1.4 cm; Other 2 dimensions: 1.0 x 1.1 cm  Composition: mixed cystic and solid (1)  This nodule does NOT meet TI-RADS criteria for biopsy or dedicated follow-up.  _________________________________________________________   Nodule # 3:  Location: RIGHT; Mid /to the RIGHT of the isthmus  Maximum size: 1.0 cm; Other 2 dimensions: 0.4 x 1.0 cm  Composition: solid/almost completely solid (2)  Echogenicity: hypoechoic (2) *Given size (>/= 1 - 1.4 cm) and appearance, a follow-up ultrasound in 1 year should be considered based on TI-RADS criteria.  _________________________________________________________   Nodule # 5:   Location: LEFT; Mid  Maximum size: 2.7 cm; Other 2 dimensions: 1.3 x 2.1 cm  Composition: mixed cystic and solid (1) Echogenic foci: punctate echogenic foci (3) **Given size (>/= 1.5 cm) and appearance, fine needle aspiration of this moderately suspicious nodule should be considered based on TI-RADS criteria.  _________________________________________________________   Nodule labeled #1 is spongiform and measures up to 1.0 cm, and nodule labeled #4 is cystic and measures up to 1.0 cm. These nodules do NOT meet TI-RADS criteria for biopsy or dedicated follow-up.   No cervical adenopathy or abnormal fluid collection within the imaged neck.     L lobe:   FNA (03/13/2021): Atypia of unknown significance; Afirma molecular analysis: benign  Thyroid U/S (03/21/2022): Parenchymal Echotexture: Moderately heterogeneous  Isthmus: 0.2 cm ,previously 0.3 cm  Right lobe: 6.5 x 2.0 x 2.0 cm ,previously 5.7 x 1.6 x 1.9 cm  Left lobe: 6.0 x 1.5 x 1.7 cm ,previously 6.0 x 1.9 x 2.5 cm  ________________________________________________________   Estimated total number of nodules >/= 1 cm: 4 _________________________________________________________   The previously visualized multiple thyroid nodules in the right hemi thyroid appear stable and benign. Each of these nodules is spongiform with the exception of right superior hypoechoic solid nodule which measures up to 0.8 cm, previously 1.0 cm.   Similar appearance of previously visualized, benign-appearing, mostly cystic nodule in the left superior thyroid (labeled 5, 1.2 cm, previously 1.0 cm).   Persistent resolution of the previously visualized cystic component from previously biopsied left mid thyroid nodule which now appears spongiform and measures up to 1.3 cm, unchanged.  Additional subcentimeter spongiform nodule is visualized just deep to the previously biopsied nodule, also appearing benign and not warranting additional  follow-up.   No cervical lymphadenopathy.   IMPRESSION: 1. Similar appearing multinodular thyroid. 2. Similar appearance of previously biopsied in aspirated solid cystic nodule in the left mid thyroid (labeled 6, 1.3 cm, previously 2.7 cm). Recommend correlation with prior biopsy results. 3. The remaining visualized thyroid nodules bilaterally appear benign and do not warrant additional follow-up.      Pt denies: - feeling nodules in neck - hoarseness - choking But has occasional dysphagia.  I reviewed pt's thyroid tests: Lab Results  Component Value Date   TSH 0.92 03/18/2022   TSH 0.77 02/26/2021   TSH 2.57 02/08/2020   TSH 0.90 02/03/2018   TSH 1.18 12/12/2014   TSH 1.40 10/27/2013   TSH 1.10 01/11/2013   TSH 1.30 01/11/2012   TSH 1.16 01/07/2011   TSH 0.79 01/03/2010   FREET4 0.83 02/26/2021   FREET4 0.81 12/12/2014   FREET4 1.0 10/29/2006    + FH of thyroid ds.: mother - ? Nodule  No FH of thyroid cancer. No h/o radiation tx to head or neck. No Biotin supplements or Hair, Skin and Nails vitamins.  Pt also has a history of melanoma >> had 1/2 R thumb removed. She may have IBS - prev. Dx'ed by Dr Altheimer. She also has a history of osteopenia per review of her bone density from 2021.  ROS: + See HPI   Past Medical History:  Diagnosis Date   Allergy    Anal fissure    Arthritis    hands   Basal cell carcinoma    Benign paroxysmal positional vertigo    Complication of anesthesia    Endometriosis    Hemorrhoids    History of skin cancer    Hyperlipidemia    Hyperplastic colon polyp    Hypertension    IBS (irritable bowel syndrome)    Melanoma (HCC) 06/10/2016   right thumb    Melanoma in situ (HCC) 05/31/2016   Personal history of COVID-19    PONV (postoperative nausea and vomiting)    Past Surgical History:  Procedure Laterality Date   ABDOMINAL HYSTERECTOMY     bso enometriosis; no cancer   AMPUTATION Right 06/02/2016   Procedure:  AMPUTATION RIGHT THUMB INTERPHALANGEAL JOINT;  Surgeon: Cindee Salt, MD;  Location: [facility];  Service: Orthopedics;  Laterality: Right;   BUNIONECTOMY Left 11/23/2008   MOHS SURGERY     nose   NAILBED REPAIR Right 05/12/2016   Procedure: EXCISION OF NAIL MATRIX BED RIGHT;  Surgeon: Cindee Salt, MD;  Location: [facility];  Service: Orthopedics;  Laterality: Right;   ROTATOR CUFF REPAIR Right 10/2009   SKIN BIOPSY  03/16/2017   shave biopsy, right inferior helix, chondrodermatits nodularis helicis   SKIN FULL THICKNESS GRAFT Right 05/12/2016   Procedure: UPPER ARM SKIN GRAFT RIGHT THUMB;  Surgeon: Cindee Salt, MD;  Location:  SUR[facility]ERY CENTER;  Service: Orthopedics;  Laterality: Right;   thumb nail surgery Right    Social History   Socioeconomic History   Marital status: Married    Spouse name: Not on file   Number of children: 0   Years of education: Not on file   Highest education level: Not on file  Occupational History    Comment: retired    Occupation: retired  Tobacco Use   Smoking status: Former    Current packs/day: 0.00  Types: Cigarettes    Quit date: 63    Years since quitting: 47.2   Smokeless tobacco: Never  Vaping Use   Vaping status: Never Used  Substance and Sexual Activity   Alcohol use: Yes    Alcohol/week: 0.0 standard drinks of alcohol    Comment: rare   Drug use: No   Sexual activity: Not on file  Other Topics Concern   Not on file  Social History Narrative   Widowed   Remarried 2015 2016   [facility]   Dog and Cat   6-7 hours of sleep   Mom passed away 12/15/09  Working [employer] jeans  38 - 43  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   2 adopted sons   Social Drivers of Corporate investment banker Strain: Low Risk  (07/09/2021)   Overall Financial Resource Strain (CARDIA)    Difficulty of Paying Living Expenses: Not hard at all  Food  Insecurity: No Food Insecurity (07/09/2021)   Hunger Vital Sign    Worried About Running Out of Food in the Last Year: Never true    Ran Out of Food in the Last Year: Never true  Transportation Needs: No Transportation Needs (07/09/2021)   PRAPARE - Administrator, Civil Service (Medical): No    Lack of Transportation (Non-Medical): No  Physical Activity: Sufficiently Active (07/09/2021)   Exercise Vital Sign    Days of Exercise per Week: 5 days    Minutes of Exercise per Session: 40 min  Stress: No Stress Concern Present (07/09/2021)   [employer] of Occupational Health - Occupational Stress Questionnaire    Feeling of Stress : Not at all  Social Connections: Socially Integrated (07/09/2021)   Social Connection and Isolation Panel [NHANES]    Frequency of Communication with Friends and Family: More than three times a week    Frequency of Social Gatherings with Friends and Family: More than three times a week    Attends Religious Services: More than 4 times per year    Active Member of [employer] or Organizations: Yes    Attends Engineer, structural: More than 4 times per year    Marital Status: Married  Catering manager Violence: Not At Risk (07/09/2021)   Humiliation, Afraid, Rape, and Kick questionnaire    Fear of Current or Ex-Partner: No    Emotionally Abused: No    Physically Abused: No    Sexually Abused: No   Current Outpatient Medications on File Prior to Visit  Medication Sig Dispense Refill   aspirin 81 MG chewable tablet Chew 81 mg by mouth daily.     betamethasone dipropionate 0.05 % lotion Apply 1 Application topically as needed.     Cholecalciferol (VITAMIN D3) 400 UNITS CAPS Take 1 capsule by mouth daily.     hydrocortisone 2.5 % cream hydrocortisone 2.5 % topical cream     losartan (COZAAR) 50 MG tablet TAKE 1 TABLET EVERY DAY 90 tablet 3   LYSINE PO Take by mouth as needed.      MULTIPLE VITAMIN PO Take by mouth.     Omega-3 Fatty Acids (FISH  OIL) 1000 MG CAPS      triamcinolone cream (KENALOG) 0.1 % Apply 1 application topically 2 (two) times daily.     TURMERIC PO Take 750 mg by mouth daily.     valACYclovir (VALTREX) 500  MG tablet Take 4 tablets (2,000 mg total) by mouth 2 (two) times daily. Prn cold sores 60 tablet 2   No current facility-administered medications on file prior to visit.   Allergies  Allergen Reactions   Fluticasone Propionate Swelling and Other (See Comments)    Nose bleed    Loperamide Other (See Comments)    Passed out    Other Other (See Comments)    Hummus? Swelling of face Hummus? Swelling of face   Benadryl [Diphenhydramine] Anxiety   Lisinopril Other (See Comments)    REACTION: dizziness and light headed   Family History  Problem Relation Age of Onset   Stroke Mother    Hypertension Mother    Non-Hodgkin's lymphoma Mother    Breast cancer Sister    Diabetes Sister    Colon polyps Sister    Prostatitis Brother    Hypertension Maternal Grandmother    Stroke Maternal Grandmother    Colon cancer Neg Hx    PE: BP 120/70   Pulse 86   Ht 5' 3.25" (1.607 m)   Wt 143 lb (64.9 kg)   SpO2 98%   BMI 25.13 kg/m  Wt Readings from Last 3 Encounters:  04/02/23 143 lb (64.9 kg)  11/20/22 140 lb 2 oz (63.6 kg)  04/21/22 141 lb (64 kg)   Constitutional: normal weight, in NAD Eyes: EOMI, no exophthalmos ENT: no thyromegaly but smooth nodule  palpated in the left lower neck, measuring approximately 1 cm, no cervical lymphadenopathy Cardiovascular: RRR, No MRG Respiratory: CTA B Musculoskeletal: no deformities  Skin: no rashes Neurological: no tremor with outstretched hands  ASSESSMENT: 1. Thyroid nodules  2.  Dysphagia  PLAN: 1. Thyroid nodules and 2.  Dysphagia -Patient with a history of a left dominant thyroid nodule which was quite large, but the majority of the nodule appeared to be cystic on an ultrasound from 2023.  She also had other thyroid nodules in the right lobe, however  none were worrisome.  One of the nodules, in the isthmus, qualified for 1 year follow-up.  We repeated the thyroid ultrasound in 02/2022 and the majority of the nodules appeared to be stable and benign appearing.  The left dominant nodule was actually much decreased in size after the biopsy, shrinking from 2.7 cm to 1.3 cm.  We discussed that this was excellent use and no intervention was needed.  At last visit, I advised her to return in 2 years, but she presents 1 year after last visit. -Her thyroid nodules did not have worrisome features including: - solid hypoechogenicity (the nodules appeared to be hypoechoic, however, due to fluid component, which is not associated with increased malignancy risk)  - presence of microcalcifications - presence of internal blood flow - taller-than-wide distribution - irregular contours - She does not have a thyroid cancer family history or personal history of radiation therapy to head or neck to increase her own risk of thyroid malignancy -Her dominant thyroid nodule was biopsied with inconclusive results in the past: Atypia of unknown significance.  However, a sample was sent for Afirma molecular marker and this returned benign.  We discussed that in that case, the risk of cancer is very low, similar to a benign biopsy. -We discussed that thyroid cancer is usually a slow-growing cancer, with good prognosis, which usually does not affect life expectancy and quality of life.  Even if one of her nodules harbors a focus of thyroid cancer, this is unlikely to affect her during her lifetime. -She  previously had mild neck compression symptoms and we discussed that if these worsen, we could try to aspirate the fluid part of the nodule and possibly perform RFA afterwards, lobectomy, or thyroidectomy, if the symptoms recur.  However, she is not very bothered by the nodules.  At today's visit she mentions occasional dysphagia, which is not new and does not appear to be related to  her thyroid nodules, as these are now quite small.  We discussed that she may need to have an endoscopy if this persists to rule out esophagitis or esophageal strictures. -We reviewed her TFTs and they have been normal.  Will not repeat a TSH today.  She has an annual physical exam coming up with PCP in 2 weeks. -At today's visit, we discussed about checking another ultrasound in  2 years unless she develops new compression symptoms.  Carlus Pavlov, MD PhD [facility]

## 2023-04-02 NOTE — Patient Instructions (Addendum)
Please come back for a follow-up appointment in 2 years. 

## 2023-04-13 NOTE — Progress Notes (Unsigned)
 No chief complaint on file.   HPI: Patient  Sheila Livingston  78 y.o. comes in today for Preventive Health Care visit    Health Maintenance  Topic Date Due   COVID-19 Vaccine (1) Never done   Zoster Vaccines- Shingrix (1 of 2) 05/26/1962   Medicare Annual Wellness (AWV)  07/14/2023   Pneumonia Vaccine 1+ Years old  Completed   INFLUENZA VACCINE  Completed   DEXA SCAN  Completed   Hepatitis C Screening  Completed   HPV VACCINES  Aged Out   DTaP/Tdap/Td  Discontinued   Colonoscopy  Discontinued   Health Maintenance Review LIFESTYLE:  Exercise:   Tobacco/ETS: Alcohol:  Sugar beverages: Sleep: Drug use: no HH of     ROS:  GEN/ HEENT: No fever, significant weight changes sweats headaches vision problems hearing changes, CV/ PULM; No chest pain shortness of breath cough, syncope,edema  change in exercise tolerance. GI /GU: No adominal pain, vomiting, change in bowel habits. No blood in the stool. No significant GU symptoms. SKIN/HEME: ,no acute skin rashes suspicious lesions or bleeding. No lymphadenopathy, nodules, masses.  NEURO/ PSYCH:  No neurologic signs such as weakness numbness. No depression anxiety. IMM/ Allergy: No unusual infections.  Allergy .   REST of 12 system review negative except as per HPI   Past Medical History:  Diagnosis Date   Allergy    Anal fissure    Arthritis    hands   Basal cell carcinoma    Benign paroxysmal positional vertigo    Complication of anesthesia    Endometriosis    Hemorrhoids    History of skin cancer    Hyperlipidemia    Hyperplastic colon polyp    Hypertension    IBS (irritable bowel syndrome)    Melanoma (HCC) 06/10/2016   right thumb    Melanoma in situ (HCC) 05/31/2016   Personal history of COVID-19    PONV (postoperative nausea and vomiting)     Past Surgical History:  Procedure Laterality Date   ABDOMINAL HYSTERECTOMY     bso enometriosis; no cancer   AMPUTATION Right 06/02/2016   Procedure: AMPUTATION  RIGHT THUMB INTERPHALANGEAL JOINT;  Surgeon: Cindee Salt, MD;  Location: [facility];  Service: Orthopedics;  Laterality: Right;   BUNIONECTOMY Left 11/23/2008   MOHS SURGERY     nose   NAILBED REPAIR Right 05/12/2016   Procedure: EXCISION OF NAIL MATRIX BED RIGHT;  Surgeon: Cindee Salt, MD;  Location: [facility];  Service: Orthopedics;  Laterality: Right;   ROTATOR CUFF REPAIR Right 10/2009   SKIN BIOPSY  03/16/2017   shave biopsy, right inferior helix, chondrodermatits nodularis helicis   SKIN FULL THICKNESS GRAFT Right 05/12/2016   Procedure: UPPER ARM SKIN GRAFT RIGHT THUMB;  Surgeon: Cindee Salt, MD;  Location: [facility];  Service: Orthopedics;  Laterality: Right;   thumb nail surgery Right     Family History  Problem Relation Age of Onset   Stroke Mother    Hypertension Mother    Non-Hodgkin's lymphoma Mother    Breast cancer Sister    Diabetes Sister    Colon polyps Sister    Prostatitis Brother    Hypertension Maternal Grandmother    Stroke Maternal Grandmother    Colon cancer Neg Hx     Social History   Socioeconomic History   Marital status: Married    Spouse name: Not on file   Number of children: 0   Years of education: Not  on file   Highest education level: Not on file  Occupational History    Comment: retired    Occupation: retired  Tobacco Use   Smoking status: Former    Current packs/day: 0.00    Types: Cigarettes    Quit date: 1978    Years since quitting: 47.2   Smokeless tobacco: Never  Vaping Use   Vaping status: Never Used  Substance and Sexual Activity   Alcohol use: Yes    Alcohol/week: 0.0 standard drinks of alcohol    Comment: rare   Drug use: No   Sexual activity: Not on file  Other Topics Concern   Not on file  Social History Narrative   Widowed   Remarried 2015 2016   [facility] of 2   Dog and Cat   6-7 hours of sleep   Mom passed away 12/21/2009  Working [employer] jeans  38 - 43  retired  December 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   2 adopted sons   Social Drivers of Corporate investment banker Strain: Low Risk  (07/09/2021)   Overall Financial Resource Strain (CARDIA)    Difficulty of Paying Living Expenses: Not hard at all  Food Insecurity: No Food Insecurity (07/09/2021)   Hunger Vital Sign    Worried About Running Out of Food in the Last Year: Never true    Ran Out of Food in the Last Year: Never true  Transportation Needs: No Transportation Needs (07/09/2021)   PRAPARE - Administrator, Civil Service (Medical): No    Lack of Transportation (Non-Medical): No  Physical Activity: Sufficiently Active (07/09/2021)   Exercise Vital Sign    Days of Exercise per Week: 5 days    Minutes of Exercise per Session: 40 min  Stress: No Stress Concern Present (07/09/2021)   [employer] of Occupational Health - Occupational Stress Questionnaire    Feeling of Stress : Not at all  Social Connections: Socially Integrated (07/09/2021)   Social Connection and Isolation Panel [NHANES]    Frequency of Communication with Friends and Family: More than three times a week    Frequency of Social Gatherings with Friends and Family: More than three times a week    Attends Religious Services: More than 4 times per year    Active Member of Clubs or Organizations: Yes    Attends Engineer, structural: More than 4 times per year    Marital Status: Married    Outpatient Medications Prior to Visit  Medication Sig Dispense Refill   aspirin 81 MG chewable tablet Chew 81 mg by mouth daily.     betamethasone dipropionate 0.05 % lotion Apply 1 Application topically as needed.     Cholecalciferol (VITAMIN D3) 400 UNITS CAPS Take 1 capsule by mouth daily.     hydrocortisone 2.5 % cream hydrocortisone 2.5 % topical cream     losartan (COZAAR) 50 MG tablet TAKE 1 TABLET EVERY DAY 90 tablet 3   LYSINE PO Take by mouth as needed.       MULTIPLE VITAMIN PO Take by mouth.     Omega-3 Fatty Acids (FISH OIL) 1000 MG CAPS      triamcinolone cream (KENALOG) 0.1 % Apply 1 application topically 2 (two) times daily.     TURMERIC PO Take 750 mg by mouth daily.     valACYclovir (VALTREX) 500 MG tablet Take 4 tablets (  2,000 mg total) by mouth 2 (two) times daily. Prn cold sores 60 tablet 2   No facility-administered medications prior to visit.     EXAM:  There were no vitals taken for this visit.  There is no height or weight on file to calculate BMI. Wt Readings from Last 3 Encounters:  04/02/23 143 lb (64.9 kg)  11/20/22 140 lb 2 oz (63.6 kg)  04/21/22 141 lb (64 kg)    Physical Exam: Vital signs reviewed BJY:[id number] is a well-developed well-nourished alert cooperative    who appearsr stated age in no acute distress.  HEENT: normocephalic atraumatic , Eyes: PERRL EOM's full, conjunctiva clear, Nares: paten,t no deformity discharge or tenderness., Ears: no deformity EAC's clear TMs with normal landmarks. Mouth: clear OP, no lesions, edema.  Moist mucous membranes. Dentition in adequate repair. NECK: supple without masses, thyromegaly or bruits. CHEST/PULM:  Clear to auscultation and percussion breath sounds equal no wheeze , rales or rhonchi. No chest Mosley deformities or tenderness. Breast: normal by inspection . No dimpling, discharge, masses, tenderness or discharge . CV: PMI is nondisplaced, S1 S2 no gallops, murmurs, rubs. Peripheral pulses are full without delay.No JVD .  ABDOMEN: Bowel sounds normal nontender  No guard or rebound, no hepato splenomegal no CVA tenderness.  No hernia. Extremtities:  No clubbing cyanosis or edema, no acute joint swelling or redness no focal atrophy NEURO:  Oriented x3, cranial nerves 3-12 appear to be intact, no obvious focal weakness,gait within normal limits no abnormal reflexes or asymmetrical SKIN: No acute rashes normal turgor, color, no bruising or petechiae. PSYCH: Oriented, good eye  contact, no obvious depression anxiety, cognition and judgment appear normal. LN: no cervical axillary  adenopathy  Lab Results  Component Value Date   WBC 7.2 03/18/2022   HGB 13.5 03/18/2022   HCT 39.4 03/18/2022   PLT 258.0 03/18/2022   GLUCOSE 94 03/18/2022   CHOL 254 (H) 03/18/2022   TRIG 103.0 03/18/2022   HDL 80.10 03/18/2022   LDLDIRECT 143.0 02/14/2019   LDLCALC 153 (H) 03/18/2022   ALT 15 03/18/2022   AST 16 03/18/2022   NA 140 03/18/2022   K 4.3 03/18/2022   CL 104 03/18/2022   CREATININE 0.79 03/18/2022   BUN 17 03/18/2022   CO2 24 03/18/2022   TSH 0.92 03/18/2022    BP Readings from Last 3 Encounters:  04/02/23 120/70  11/20/22 122/70  04/21/22 120/80    Lab plan reviewed with patient   ASSESSMENT AND PLAN:  Discussed the following assessment and plan:    ICD-10-CM   1. Visit for preventive health examination  Z00.00     2. Essential hypertension  I10     3. History of melanoma in situ  Z86.006     4. Medication management  Z79.899     5. Hyperlipidemia, unspecified hyperlipidemia type  E78.5      No follow-ups on file.  Patient Care Team: Aileana Hodder, Neta Mends, MD as PCP - General (Internal Medicine) Eugenia Mcalpine, MD (Inactive) (Orthopedic Surgery) amy Nixon  Mitchel Honour, DO as Consulting Physician (Obstetrics and Gynecology) Cindee Salt, MD as Consulting Physician (Orthopedic Surgery) Luretha Murphy, MD as Consulting Physician (General Surgery) Nettie Elm (Dermatology) There are no Patient Instructions on file for this visit.  Neta Mends. Raylyn Carton [name]

## 2023-04-14 ENCOUNTER — Ambulatory Visit (INDEPENDENT_AMBULATORY_CARE_PROVIDER_SITE_OTHER): Admitting: Internal Medicine

## 2023-04-14 ENCOUNTER — Encounter: Payer: Self-pay | Admitting: Internal Medicine

## 2023-04-14 VITALS — BP 120/76 | HR 56 | Temp 97.5°F | Ht 63.8 in | Wt 144.0 lb

## 2023-04-14 DIAGNOSIS — Z86006 Personal history of melanoma in-situ: Secondary | ICD-10-CM

## 2023-04-14 DIAGNOSIS — I10 Essential (primary) hypertension: Secondary | ICD-10-CM

## 2023-04-14 DIAGNOSIS — E785 Hyperlipidemia, unspecified: Secondary | ICD-10-CM | POA: Diagnosis not present

## 2023-04-14 DIAGNOSIS — M859 Disorder of bone density and structure, unspecified: Secondary | ICD-10-CM | POA: Diagnosis not present

## 2023-04-14 DIAGNOSIS — Z0000 Encounter for general adult medical examination without abnormal findings: Secondary | ICD-10-CM

## 2023-04-14 DIAGNOSIS — Z79899 Other long term (current) drug therapy: Secondary | ICD-10-CM

## 2023-04-14 LAB — CBC WITH DIFFERENTIAL/PLATELET
Basophils Absolute: 0 10*3/uL (ref 0.0–0.1)
Basophils Relative: 0.4 % (ref 0.0–3.0)
Eosinophils Absolute: 0.1 10*3/uL (ref 0.0–0.7)
Eosinophils Relative: 2.2 % (ref 0.0–5.0)
HCT: 39 % (ref 36.0–46.0)
Hemoglobin: 13.1 g/dL (ref 12.0–15.0)
Lymphocytes Relative: 33.2 % (ref 12.0–46.0)
Lymphs Abs: 2 10*3/uL (ref 0.7–4.0)
MCHC: 33.6 g/dL (ref 30.0–36.0)
MCV: 92.5 fl (ref 78.0–100.0)
Monocytes Absolute: 0.4 10*3/uL (ref 0.1–1.0)
Monocytes Relative: 7.1 % (ref 3.0–12.0)
Neutro Abs: 3.5 10*3/uL (ref 1.4–7.7)
Neutrophils Relative %: 57.1 % (ref 43.0–77.0)
Platelets: 238 10*3/uL (ref 150.0–400.0)
RBC: 4.22 Mil/uL (ref 3.87–5.11)
RDW: 13.3 % (ref 11.5–15.5)
WBC: 6.1 10*3/uL (ref 4.0–10.5)

## 2023-04-14 LAB — COMPREHENSIVE METABOLIC PANEL
ALT: 14 U/L (ref 0–35)
AST: 16 U/L (ref 0–37)
Albumin: 4.4 g/dL (ref 3.5–5.2)
Alkaline Phosphatase: 70 U/L (ref 39–117)
BUN: 14 mg/dL (ref 6–23)
CO2: 28 meq/L (ref 19–32)
Calcium: 9.6 mg/dL (ref 8.4–10.5)
Chloride: 103 meq/L (ref 96–112)
Creatinine, Ser: 0.74 mg/dL (ref 0.40–1.20)
GFR: 76.7 mL/min (ref >60.00)
Glucose, Bld: 101 mg/dL — ABNORMAL HIGH (ref 70–99)
Potassium: 3.8 meq/L (ref 3.5–5.1)
Sodium: 138 meq/L (ref 135–145)
Total Bilirubin: 0.6 mg/dL (ref 0.2–1.2)
Total Protein: 7.5 g/dL (ref 6.0–8.3)

## 2023-04-14 LAB — LIPID PANEL
Cholesterol: 255 mg/dL — ABNORMAL HIGH (ref 0–200)
HDL: 79 mg/dL (ref >39.00)
LDL Cholesterol: 155 mg/dL — ABNORMAL HIGH (ref 0–99)
NonHDL: 176.23
Total CHOL/HDL Ratio: 3
Triglycerides: 107 mg/dL (ref 0.0–149.0)
VLDL: 21.4 mg/dL (ref 0.0–40.0)

## 2023-04-14 LAB — TSH: TSH: 1.27 u[IU]/mL (ref 0.35–5.50)

## 2023-04-14 LAB — VITAMIN D 25 HYDROXY (VIT D DEFICIENCY, FRACTURES): VITD: 36.45 ng/mL (ref 30.00–100.00)

## 2023-04-14 NOTE — Progress Notes (Signed)
 Cholesterol about the same , kidney function normal ,blood count normal ,  vit D is in low normal range   continue vit d supplementation

## 2023-04-14 NOTE — Patient Instructions (Signed)
 Good to see you today  BP is at goal  continue medication. Weight bearing exercise and resistance exercises to maintain bone health and muscle mass .   Avoid excess processed foods  as possible for cholesterol control

## 2023-06-15 DIAGNOSIS — L821 Other seborrheic keratosis: Secondary | ICD-10-CM | POA: Diagnosis not present

## 2023-06-15 DIAGNOSIS — Z85820 Personal history of malignant melanoma of skin: Secondary | ICD-10-CM | POA: Diagnosis not present

## 2023-06-15 DIAGNOSIS — L814 Other melanin hyperpigmentation: Secondary | ICD-10-CM | POA: Diagnosis not present

## 2023-06-15 DIAGNOSIS — D225 Melanocytic nevi of trunk: Secondary | ICD-10-CM | POA: Diagnosis not present

## 2023-06-15 DIAGNOSIS — Z08 Encounter for follow-up examination after completed treatment for malignant neoplasm: Secondary | ICD-10-CM | POA: Diagnosis not present

## 2023-06-15 DIAGNOSIS — Z85828 Personal history of other malignant neoplasm of skin: Secondary | ICD-10-CM | POA: Diagnosis not present

## 2023-06-18 DIAGNOSIS — M2042 Other hammer toe(s) (acquired), left foot: Secondary | ICD-10-CM | POA: Diagnosis not present

## 2023-06-18 DIAGNOSIS — M19072 Primary osteoarthritis, left ankle and foot: Secondary | ICD-10-CM | POA: Diagnosis not present

## 2023-06-18 DIAGNOSIS — S90122A Contusion of left lesser toe(s) without damage to nail, initial encounter: Secondary | ICD-10-CM | POA: Diagnosis not present

## 2023-06-18 DIAGNOSIS — M79675 Pain in left toe(s): Secondary | ICD-10-CM | POA: Diagnosis not present

## 2023-06-18 DIAGNOSIS — M2041 Other hammer toe(s) (acquired), right foot: Secondary | ICD-10-CM | POA: Diagnosis not present

## 2023-08-17 DIAGNOSIS — Z1231 Encounter for screening mammogram for malignant neoplasm of breast: Secondary | ICD-10-CM | POA: Diagnosis not present

## 2023-08-17 LAB — HM MAMMOGRAPHY

## 2023-08-19 ENCOUNTER — Encounter: Payer: Self-pay | Admitting: Internal Medicine

## 2023-08-24 ENCOUNTER — Ambulatory Visit: Admitting: Family Medicine

## 2023-08-31 DIAGNOSIS — R921 Mammographic calcification found on diagnostic imaging of breast: Secondary | ICD-10-CM | POA: Diagnosis not present

## 2023-09-03 ENCOUNTER — Other Ambulatory Visit: Payer: Self-pay

## 2023-09-03 DIAGNOSIS — R921 Mammographic calcification found on diagnostic imaging of breast: Secondary | ICD-10-CM | POA: Diagnosis not present

## 2023-09-03 DIAGNOSIS — N6489 Other specified disorders of breast: Secondary | ICD-10-CM | POA: Diagnosis not present

## 2023-09-06 LAB — SURGICAL PATHOLOGY

## 2023-10-05 DIAGNOSIS — Z01419 Encounter for gynecological examination (general) (routine) without abnormal findings: Secondary | ICD-10-CM | POA: Diagnosis not present

## 2023-10-05 DIAGNOSIS — Z6824 Body mass index (BMI) 24.0-24.9, adult: Secondary | ICD-10-CM | POA: Diagnosis not present

## 2023-10-05 DIAGNOSIS — B372 Candidiasis of skin and nail: Secondary | ICD-10-CM | POA: Diagnosis not present

## 2023-10-14 DIAGNOSIS — J342 Deviated nasal septum: Secondary | ICD-10-CM | POA: Diagnosis not present

## 2023-10-14 DIAGNOSIS — H903 Sensorineural hearing loss, bilateral: Secondary | ICD-10-CM | POA: Diagnosis not present

## 2023-10-23 ENCOUNTER — Other Ambulatory Visit: Payer: Self-pay | Admitting: Family

## 2023-10-28 ENCOUNTER — Other Ambulatory Visit: Payer: Self-pay | Admitting: Internal Medicine

## 2023-10-28 DIAGNOSIS — H5203 Hypermetropia, bilateral: Secondary | ICD-10-CM | POA: Diagnosis not present

## 2023-10-28 MED ORDER — LOSARTAN POTASSIUM 50 MG PO TABS
50.0000 mg | ORAL_TABLET | Freq: Every day | ORAL | 3 refills | Status: AC
Start: 2023-10-28 — End: ?

## 2023-10-28 NOTE — Telephone Encounter (Signed)
 Copied from CRM [id number]. Topic: Clinical - Medication Refill >> Oct 28, 2023  1:08 PM Mercedes MATSU wrote: Medication:  losartan  (COZAAR ) 50 MG tablet   Has the patient contacted their pharmacy? Yes (Agent: If no, request that the patient contact the pharmacy for the refill. If patient does not wish to contact the pharmacy document the reason why and proceed with request.) (Agent: If yes, when and what did the pharmacy advise?)  This is the patient's preferred pharmacy:  [facility] - [place], [place] - [address] [place] [address] Phone: [phone] Fax: [phone]  Is this the correct pharmacy for this prescription? Yes If no, delete pharmacy and type the correct one.   Has the prescription been filled recently? Yes  Is the patient out of the medication? Yes  Has the patient been seen for an appointment in the last year OR does the patient have an upcoming appointment? Yes  Can we respond through MyChart? Yes  Agent: Please be advised that Rx refills may take up to 3 business days. We ask that you follow-up with your pharmacy.

## 2023-11-08 DIAGNOSIS — I10 Essential (primary) hypertension: Secondary | ICD-10-CM | POA: Diagnosis not present

## 2023-11-08 DIAGNOSIS — Z7982 Long term (current) use of aspirin: Secondary | ICD-10-CM | POA: Diagnosis not present

## 2023-11-09 DIAGNOSIS — L538 Other specified erythematous conditions: Secondary | ICD-10-CM | POA: Diagnosis not present

## 2023-11-09 DIAGNOSIS — Z85820 Personal history of malignant melanoma of skin: Secondary | ICD-10-CM | POA: Diagnosis not present

## 2023-11-09 DIAGNOSIS — L301 Dyshidrosis [pompholyx]: Secondary | ICD-10-CM | POA: Diagnosis not present

## 2023-11-09 DIAGNOSIS — Z85828 Personal history of other malignant neoplasm of skin: Secondary | ICD-10-CM | POA: Diagnosis not present

## 2023-11-09 DIAGNOSIS — D492 Neoplasm of unspecified behavior of bone, soft tissue, and skin: Secondary | ICD-10-CM | POA: Diagnosis not present

## 2023-11-09 DIAGNOSIS — Z08 Encounter for follow-up examination after completed treatment for malignant neoplasm: Secondary | ICD-10-CM | POA: Diagnosis not present

## 2023-12-28 ENCOUNTER — Ambulatory Visit: Admitting: Family Medicine

## 2023-12-28 DIAGNOSIS — Z0000 Encounter for general adult medical examination without abnormal findings: Secondary | ICD-10-CM | POA: Diagnosis not present

## 2023-12-28 NOTE — Progress Notes (Signed)
 ----------------------------------------------------------------------------------------------------------------------------------------------------------------------------------------------------------------------  Because this visit was a virtual/telehealth visit, some criteria may be missing or patient reported. Any vitals not documented were not able to be obtained and vitals that have been documented are patient reported.    MEDICARE ANNUAL PREVENTIVE VISIT WITH PROVIDER: (Welcome to Medicare, initial annual wellness or annual wellness exam)  Virtual Visit via Video Note  I connected with Sheila Livingston on 12/28/23 by a video enabled telemedicine application and verified that I am speaking with the correct person using two identifiers.  Location patient: home Location provider:work or home office Persons participating in the virtual visit: patient, provider  Concerns and/or follow up today: detailed intake and risks/health assessment completed in flowsheets and below - please see for details.   How often do you have a drink containing alcohol?rare, social How many drinks containing alcohol do you have on a typical day when you are drinking?1 How often do you have six or more drinks on one occasion?never Have you ever smoked?y Quit date if applicable? 1979  How many packs a day do/did you smoke? 1ppd Do you use smokeless tobacco?na Do you use an illicit drugs?n Do you feel safe at home?y Last dentist visit? Goes on regular basis Last eye Exam and location?[facility], once per year   See HM section in Epic for other details of completed HM.    ROS: negative for report of fevers, unintentional weight loss, vision changes, vision loss, hearing loss or change, chest pain, sob, hemoptysis, melena, hematochezia, hematuria or bleeding or bruising  Patient-completed extensive health risk assessment - reviewed and discussed with the patient: See Health Risk Assessment completed with  patient prior to the visit either above or in recent phone note. This was reviewed in detailed with the patient today and appropriate recommendations, orders and referrals were placed as needed per Summary below and patient instructions.   Review of Medical History: -PMH, PSH, Family History and current specialty and care providers reviewed and updated and listed below   Patient Care Team: Panosh, Apolinar POUR, MD as PCP - General (Internal Medicine) Gerome Charleston, MD (Inactive) (Orthopedic Surgery) amy Barrantes  Dannielle Bouchard, DO as Consulting Physician (Obstetrics and Gynecology) Murrell Kuba, MD as Consulting Physician (Orthopedic Surgery) Gladis Cough, MD as Consulting Physician (General Surgery) Verdel Alexa GORMAN DEVONNA (Dermatology)   Past Medical History:  Diagnosis Date   Allergy    Anal fissure    Arthritis    hands   Basal cell carcinoma    Benign paroxysmal positional vertigo    Complication of anesthesia    Endometriosis    Hemorrhoids    History of skin cancer    Hyperlipidemia    Hyperplastic colon polyp    Hypertension    IBS (irritable bowel syndrome)    Melanoma (HCC) 06/10/2016   right thumb    Melanoma in situ (HCC) 05/31/2016   Personal history of COVID-19    PONV (postoperative nausea and vomiting)     Past Surgical History:  Procedure Laterality Date   ABDOMINAL HYSTERECTOMY     bso enometriosis; no cancer   AMPUTATION Right 06/02/2016   Procedure: AMPUTATION RIGHT THUMB INTERPHALANGEAL JOINT;  Surgeon: Murrell Kuba, MD;  Location: [facility];  Service: Orthopedics;  Laterality: Right;   BUNIONECTOMY Left 11/23/2008   MOHS SURGERY     nose   NAILBED REPAIR Right 05/12/2016   Procedure: EXCISION OF NAIL MATRIX BED RIGHT;  Surgeon: Kuba Murrell, MD;  Location: [facility];  Service: Orthopedics;  Laterality: Right;   ROTATOR CUFF REPAIR Right 10/2009   SKIN BIOPSY  03/16/2017   shave biopsy, right inferior helix,  chondrodermatits nodularis helicis   SKIN FULL THICKNESS GRAFT Right 05/12/2016   Procedure: UPPER ARM SKIN GRAFT RIGHT THUMB;  Surgeon: Arley Curia, MD;  Location: [facility];  Service: Orthopedics;  Laterality: Right;   thumb nail surgery Right     Social History   Socioeconomic History   Marital status: Married    Spouse name: Not on file   Number of children: 0   Years of education: Not on file   Highest education level: 12th grade  Occupational History    Comment: retired    Occupation: retired  Tobacco Use   Smoking status: Former    Current packs/day: 0.00    Types: Cigarettes    Quit date: 1978    Years since quitting: 47.9   Smokeless tobacco: Never  Vaping Use   Vaping status: Never Used  Substance and Sexual Activity   Alcohol use: Yes    Alcohol/week: 0.0 standard drinks of alcohol    Comment: rare   Drug use: No   Sexual activity: Not on file  Other Topics Concern   Not on file  Social History Narrative   Widowed   Remarried 2015 2016   [facility] of 2   Dog and Cat   6-7 hours of sleep   Mom passed away 12/06/09  Working [employer]  38 - 43  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   2 adopted sons   Social Drivers of Corporate Investment Banker Strain: Low Risk  (12/28/2023)   Overall Financial Resource Strain (CARDIA)    Difficulty of Paying Living Expenses: Not hard at all  Food Insecurity: No Food Insecurity (12/28/2023)   Hunger Vital Sign    Worried About Running Out of Food in the Last Year: Never true    Ran Out of Food in the Last Year: Never true  Transportation Needs: No Transportation Needs (12/28/2023)   PRAPARE - Administrator, Civil Service (Medical): No    Lack of Transportation (Non-Medical): No  Physical Activity: Sufficiently Active (12/28/2023)   Exercise Vital Sign    Days of Exercise per Week: 7 days    Minutes of Exercise per Session: 30 min   Stress: No Stress Concern Present (12/28/2023)   [employer] of Occupational Health - Occupational Stress Questionnaire    Feeling of Stress: Not at all  Social Connections: Socially Integrated (12/28/2023)   Social Connection and Isolation Panel    Frequency of Communication with Friends and Family: Twice a week    Frequency of Social Gatherings with Friends and Family: Twice a week    Attends Religious Services: More than 4 times per year    Active Member of [employer] or Organizations: Yes    Attends Engineer, Structural: More than 4 times per year    Marital Status: Married  Catering Manager Violence: Not At Risk (06/17/2023)   Received from [facility]   HITS    Over the last 12 months how often did your partner physically hurt you?: Never    Over the last 12 months how often did your partner insult you or talk down to you?: Patient declined    Over the last 12 months how often did  your partner threaten you with physical harm?: Patient declined    Over the last 12 months how often did your partner scream or curse at you?: Patient declined    Family History  Problem Relation Age of Onset   Stroke Mother    Hypertension Mother    Non-Hodgkin's lymphoma Mother    Breast cancer Sister    Diabetes Sister    Colon polyps Sister    Prostatitis Brother    Hypertension Maternal Grandmother    Stroke Maternal Grandmother    Colon cancer Neg Hx     Current Outpatient Medications on File Prior to Visit  Medication Sig Dispense Refill   aspirin 81 MG chewable tablet Chew 81 mg by mouth daily.     betamethasone dipropionate 0.05 % lotion Apply 1 Application topically as needed.     Cholecalciferol (VITAMIN D3) 400 UNITS CAPS Take 1 capsule by mouth daily.     hydrocortisone 2.5 % cream hydrocortisone 2.5 % topical cream     losartan  (COZAAR ) 50 MG tablet Take 1 tablet (50 mg total) by mouth daily. 90 tablet 3   LYSINE PO Take by mouth as needed.      MULTIPLE VITAMIN PO  Take by mouth.     Omega-3 Fatty Acids (FISH OIL) 1000 MG CAPS      triamcinolone  cream (KENALOG ) 0.1 % Apply 1 application topically 2 (two) times daily.     TURMERIC PO Take 750 mg by mouth daily.     valACYclovir  (VALTREX ) 500 MG tablet Take 4 tablets (2,000 mg total) by mouth 2 (two) times daily. Prn cold sores 60 tablet 2   No current facility-administered medications on file prior to visit.    Allergies  Allergen Reactions   Fluticasone Propionate Swelling and Other (See Comments)    Nose bleed    Loperamide Other (See Comments)    Passed out    Other Other (See Comments)    Hummus? Swelling of face Hummus? Swelling of face   Benadryl [Diphenhydramine] Anxiety   Lisinopril Other (See Comments)    REACTION: dizziness and light headed       Physical Exam Vitals requested from patient and listed below if patient had equipment and was able to obtain at home for this virtual visit: There were no vitals filed for this visit. Estimated body mass index is 24.87 kg/m as calculated from the following:   Height as of 04/14/23: 5' 3.8 (1.621 m).   Weight as of 04/14/23: 144 lb (65.3 kg).  EKG (optional): deferred due to virtual visit  GENERAL: alert, oriented, no acute distress detected, full vision exam deferred due to pandemic and/or virtual encounter  HEENT: atraumatic, conjunttiva clear, no obvious abnormalities on inspection of external nose and ears  NECK: normal movements of the head and neck  LUNGS: on inspection no signs of respiratory distress, breathing rate appears normal, no obvious gross SOB, gasping or wheezing  CV: no obvious cyanosis  MS: moves all visible extremities without noticeable abnormality  PSYCH/NEURO: pleasant and cooperative, no obvious depression or anxiety, speech and thought processing grossly intact, Cognitive function grossly intact  Flowsheet Row Office Visit from 04/14/2023 in [facility]  PHQ-9 Total  Score 0        12/28/2023    1:09 PM 04/14/2023    9:19 AM 07/14/2022    5:00 PM 03/18/2022   11:46 AM 07/09/2021    2:22 PM  Depression screen PHQ 2/9  Decreased Interest  0 0 0 0 0  Down, Depressed, Hopeless 0 0 0 0 0  PHQ - 2 Score 0 0 0 0 0  Altered sleeping  0  0 0  Tired, decreased energy  0  0 0  Change in appetite  0  0 0  Feeling bad or failure about yourself   0  0 0  Trouble concentrating  0  0 0  Moving slowly or fidgety/restless  0  0 0  Suicidal thoughts  0  0 0  PHQ-9 Score  0   0  0   Difficult doing work/chores    Not difficult at all Not difficult at all     Data saved with a previous flowsheet row definition       03/18/2022   11:46 AM 07/14/2022    5:00 PM 04/14/2023    9:19 AM 12/28/2023   12:35 PM 12/28/2023    1:04 PM  Fall Risk  Falls in the past year? 0 0 1 0   Was there an injury with Fall? 0  0  0   0  Fall Risk Category Calculator 0 0 1    Patient at Risk for Falls Due to No Fall Risks No Fall Risks Other (Comment)  No Fall Risks  Fall risk Follow up Falls evaluation completed Falls evaluation completed Falls evaluation completed  Falls evaluation completed     Data saved with a previous flowsheet row definition     SUMMARY AND PLAN:  Encounter for Medicare annual wellness exam  Discussed applicable health maintenance/preventive health measures and advised and referred or ordered per patient preferences: -discussed vaccines due, advised if does at pharmacy to let us  know so that we can update record Health Maintenance  Topic Date Due   COVID-19 Vaccine (1) Never done   Medicare Annual Wellness (AWV)  07/14/2023   Zoster Vaccines- Shingrix (1 of 2) 01/14/2024 (Originally 05/26/1964)   Pneumococcal Vaccine: 50+ Years  Completed   Influenza Vaccine  Completed   Bone Density Scan  Completed   Meningococcal B Vaccine  Aged Out   DTaP/Tdap/Td  Discontinued   Colonoscopy  Discontinued   Hepatitis C Screening  Discontinued      Education and  counseling on the following was provided based on the above review of health and a plan/checklist for the patient, along with additional information discussed, was provided for the patient in the patient instructions :  -requested copy of advanced directives -discussed BP,she thinks up some this morning from running around and drinking coffee, advised she monitor once daily for the next week and keep a log, advised to call Dr. SHAUNNA if running over goal  -prove balance and discussed exercise guidelines for adults with include balance exercises at least 3 days per week.  -Advised and counseled on a healthy lifestyle - including the importance of a healthy diet, regular physical activity, social connections and stress management. -Reviewed patient's current diet. Advised and counseled on a whole foods based healthy diet. A summary of a healthy diet was provided in the Patient Instructions.  -reviewed patient's current physical activity level and discussed exercise guidelines for adults. Discussed safe exercise at home to assist in meeting exercise guideline recommendations in a safe and healthy way. Community resources also provided. -Advise yearly dental visits at minimum and regular eye exams   Follow up: see patient instructions     There are no Patient Instructions on file for this visit.  Chiquita JONELLE Cramp, DO

## 2023-12-28 NOTE — Patient Instructions (Signed)
 I really enjoyed getting to talk with you today! I am available on Tuesdays and Thursdays for virtual visits if you have any questions or concerns, or if I can be of any further assistance.   CHECKLIST FROM ANNUAL WELLNESS VISIT:  -Follow up (please call to schedule if not scheduled after visit):   -yearly for annual wellness visit with primary care office  Here is a list of your preventive care/health maintenance measures and the plan for each if any are due:  PLAN For any measures below that may be due:    1. Can get vaccine at the pharmacy, please let us  know if you do so that we can update your record.  Health Maintenance  Topic Date Due   COVID-19 Vaccine (1) Never done   Medicare Annual Wellness (AWV)  07/14/2023   Zoster Vaccines- Shingrix (1 of 2) 01/14/2024 (Originally 05/26/1964)   Pneumococcal Vaccine: 50+ Years  Completed   Influenza Vaccine  Completed   Bone Density Scan  Completed   Meningococcal B Vaccine  Aged Out   DTaP/Tdap/Td  Discontinued   Colonoscopy  Discontinued   Hepatitis C Screening  Discontinued    -See a dentist at least yearly  -Get your eyes checked and then per your eye specialist's recommendations  -Other issues addressed today:   -I have included below further information regarding a healthy whole foods based diet, physical activity guidelines for adults, stress management and opportunities for social connections. I hope you find this information useful.   -----------------------------------------------------------------------------------------------------------------------------------------------------------------------------------------------------------------------------------------------------------    NUTRITION: -eat real food: lots of colorful vegetables (half the plate) and fruits -5-7 servings of vegetables and fruits per day (fresh or steamed is best), exp. 2 servings of vegetables with lunch and dinner and 2 servings of fruit per  day. Berries and greens such as kale and collards are great choices.  -consume on a regular basis:  fresh fruits, fresh veggies, fish, nuts, seeds, healthy oils (such as olive oil, avocado oil), whole grains (make sure for bread/pasta/crackers/etc., that the first ingredient on label contains the word whole), legumes. -can eat small amounts of dairy and lean meat (no larger than the palm of your hand), but avoid processed meats such as ham, bacon, lunch meat, etc. -drink water -try to avoid fast food and pre-packaged foods, processed meat, ultra processed foods/beverages (donuts, candy, etc.) -most experts advise limiting sodium to < 2300mg  per day, should limit further is any chronic conditions such as high blood pressure, heart disease, diabetes, etc. The American Heart Association advised that < 1500mg  is is ideal -try to avoid foods/beverages that contain any ingredients with names you do not recognize  -try to avoid foods/beverages  with added sugar or sweeteners/sweets  -try to avoid sweet drinks (including diet drinks): soda, juice, Gatorade, sweet tea, power drinks, diet drinks -try to avoid white rice, white bread, pasta (unless whole grain)  EXERCISE GUIDELINES FOR ADULTS: -if you wish to increase your physical activity, do so gradually and with the approval of your doctor -STOP and seek medical care immediately if you have any chest pain, chest discomfort or trouble breathing when starting or increasing exercise  -move and stretch your body, legs, feet and arms when sitting for long periods -Physical activity guidelines for optimal health in adults: -get at least 150 minutes per week of moderate exercise (can talk, but not sing); this is about 20-30 minutes of sustained activity 5-7 days per week or two 10-15 minute episodes of sustained activity 5-7 days per  week -do some muscle building/resistance training/strength training at least 2 days per week  -balance exercises 3+ days per  week:   Stand somewhere where you have something sturdy to hold onto if you lose balance    1) lift up on toes, then back down, start with 5x per day and work up to 20x   2) stand and lift one leg straight out to the side so that foot is a few inches of the floor, start with 5x each side and work up to 20x each side   3) stand on one foot, start with 5 seconds each side and work up to 20 seconds on each side  If you need ideas or help with getting more active:  -Silver sneakers https://tools.silversneakers.com  -Walk with a Doc: Http://www.duncan-williams.com/  -try to include resistance (weight lifting/strength building) and balance exercises twice per week: or the following link for ideas: http://castillo-powell.com/  buyducts.dk  STRESS MANAGEMENT: -can try meditating, or just sitting quietly with deep breathing while intentionally relaxing all parts of your body for 5 minutes daily -if you need further help with stress, anxiety or depression please follow up with your primary doctor or contact the wonderful folks at [facility]: [phone]  SOCIAL CONNECTIONS: -options in [place] if you wish to engage in more social and exercise related activities:  -Silver sneakers https://tools.silversneakers.com  -Walk with a Doc: Http://www.duncan-williams.com/  -Check out the [facility] Active Adults 50+ section on the [employer] companies (hiking clubs, book clubs, cards and games, chess, exercise classes, aquatic classes and much more) - see the website for details: https://www.[place].gov/departments/parks-recreation/active-adults50  -YouTube has lots of exercise videos for different ages and abilities as well  -[facility] (a variety of indoor and outdoor inperson activities for adults). 8101217466. [address]  -Virtual Online Classes (a variety of  topics): see seniorplanet.org or call [phone]  -consider volunteering at a school, hospice center, church, senior center or elsewhere

## 2024-01-05 ENCOUNTER — Other Ambulatory Visit: Payer: Self-pay | Admitting: Internal Medicine

## 2024-01-05 NOTE — Telephone Encounter (Signed)
 Copied from CRM #[id number]. Topic: Clinical - Medication Refill >> Jan 05, 2024 11:17 AM Tanazia G wrote: Medication:  losartan  (COZAAR ) 50 MG   Has the patient contacted their pharmacy? Yes (Agent: If no, request that the patient contact the pharmacy for the refill. If patient does not wish to contact the pharmacy document the reason why and proceed with request.) (Agent: If yes, when and what did the pharmacy advise?)  This is the patient's preferred pharmacy:  [facility] - [place], [place] - [address] [place] [address] Phone: [phone] Fax: [phone]  Is this the correct pharmacy for this prescription? Yes If no, delete pharmacy and type the correct one.   Has the prescription been filled recently? Yes  Is the patient out of the medication? Yes  Has the patient been seen for an appointment in the last year OR does the patient have an upcoming appointment? Yes  Can we respond through MyChart? Yes  Agent: Please be advised that Rx refills may take up to 3 business days. We ask that you follow-up with your pharmacy.

## 2024-01-06 MED ORDER — LOSARTAN POTASSIUM 50 MG PO TABS: 50.0000 mg | ORAL_TABLET | Freq: Every day | ORAL | 3 refills | Status: AC

## 2024-04-18 ENCOUNTER — Encounter: Admitting: Internal Medicine
